# Patient Record
Sex: Female | Born: 1945
Health system: Southern US, Community
[De-identification: ages and names within clinical notes are randomized; demographics above are authoritative.]

## PROBLEM LIST (undated history)

## (undated) DIAGNOSIS — Z974 Presence of external hearing-aid: Secondary | ICD-10-CM

## (undated) DIAGNOSIS — E119 Type 2 diabetes mellitus without complications: Secondary | ICD-10-CM

## (undated) DIAGNOSIS — R05 Cough: Secondary | ICD-10-CM

## (undated) DIAGNOSIS — M1990 Unspecified osteoarthritis, unspecified site: Secondary | ICD-10-CM

## (undated) DIAGNOSIS — M940 Chondrocostal junction syndrome [Tietze]: Secondary | ICD-10-CM

## (undated) DIAGNOSIS — E7800 Pure hypercholesterolemia, unspecified: Secondary | ICD-10-CM

## (undated) DIAGNOSIS — J40 Bronchitis, not specified as acute or chronic: Secondary | ICD-10-CM

## (undated) DIAGNOSIS — K5792 Diverticulitis of intestine, part unspecified, without perforation or abscess without bleeding: Secondary | ICD-10-CM

## (undated) DIAGNOSIS — R053 Chronic cough: Secondary | ICD-10-CM

## (undated) DIAGNOSIS — K219 Gastro-esophageal reflux disease without esophagitis: Secondary | ICD-10-CM

## (undated) DIAGNOSIS — I10 Essential (primary) hypertension: Secondary | ICD-10-CM

## (undated) HISTORY — PX: MASTOIDECTOMY: SHX711

## (undated) HISTORY — PX: CATARACT EXTRACTION W/ INTRAOCULAR LENS IMPLANT: SHX1309

## (undated) HISTORY — PX: TONSILLECTOMY: SUR1361

## (undated) HISTORY — PX: THUMB AMPUTATION: SHX804

---

## 2004-08-16 ENCOUNTER — Emergency Department: Payer: Self-pay | Admitting: Emergency Medicine

## 2004-09-23 ENCOUNTER — Ambulatory Visit: Payer: Self-pay | Admitting: Nurse Practitioner

## 2005-04-10 HISTORY — PX: CHOLECYSTECTOMY: SHX55

## 2005-06-24 ENCOUNTER — Emergency Department: Payer: Self-pay | Admitting: Emergency Medicine

## 2005-09-06 ENCOUNTER — Other Ambulatory Visit: Payer: Self-pay

## 2005-09-06 ENCOUNTER — Ambulatory Visit: Payer: Self-pay | Admitting: General Surgery

## 2005-09-14 ENCOUNTER — Ambulatory Visit: Payer: Self-pay | Admitting: General Surgery

## 2006-05-14 DIAGNOSIS — H5440 Blindness, one eye, unspecified eye: Secondary | ICD-10-CM | POA: Insufficient documentation

## 2006-08-18 ENCOUNTER — Emergency Department: Payer: Self-pay | Admitting: Emergency Medicine

## 2006-08-18 ENCOUNTER — Other Ambulatory Visit: Payer: Self-pay

## 2006-09-05 ENCOUNTER — Ambulatory Visit: Payer: Self-pay | Admitting: Gastroenterology

## 2007-05-07 ENCOUNTER — Ambulatory Visit: Payer: Self-pay | Admitting: Internal Medicine

## 2007-05-07 ENCOUNTER — Other Ambulatory Visit: Payer: Self-pay

## 2007-05-11 ENCOUNTER — Ambulatory Visit: Payer: Self-pay | Admitting: Internal Medicine

## 2007-09-05 ENCOUNTER — Ambulatory Visit: Payer: Self-pay | Admitting: Internal Medicine

## 2008-01-15 ENCOUNTER — Ambulatory Visit: Payer: Self-pay | Admitting: Family Medicine

## 2008-01-15 ENCOUNTER — Other Ambulatory Visit: Payer: Self-pay

## 2008-01-15 ENCOUNTER — Inpatient Hospital Stay: Payer: Self-pay | Admitting: Internal Medicine

## 2008-03-07 ENCOUNTER — Ambulatory Visit: Payer: Self-pay | Admitting: Internal Medicine

## 2008-11-12 ENCOUNTER — Emergency Department: Payer: Self-pay

## 2009-08-19 ENCOUNTER — Ambulatory Visit: Payer: Self-pay | Admitting: Internal Medicine

## 2011-06-29 ENCOUNTER — Observation Stay: Payer: Self-pay | Admitting: Internal Medicine

## 2011-06-29 LAB — COMPREHENSIVE METABOLIC PANEL
Albumin: 3.9 g/dL (ref 3.4–5.0)
Alkaline Phosphatase: 91 U/L (ref 50–136)
Anion Gap: 16 (ref 7–16)
BUN: 10 mg/dL (ref 7–18)
Co2: 23 mmol/L (ref 21–32)
EGFR (African American): >60
EGFR (Non-African Amer.): >60
Glucose: 148 mg/dL — ABNORMAL HIGH (ref 65–99)
Potassium: 3.8 mmol/L (ref 3.5–5.1)
SGOT(AST): 39 U/L — ABNORMAL HIGH (ref 15–37)
SGPT (ALT): 66 U/L
Total Protein: 7.6 g/dL (ref 6.4–8.2)

## 2011-06-29 LAB — URINALYSIS, COMPLETE
Bacteria: NONE SEEN
Leukocyte Esterase: NEGATIVE
Nitrite: NEGATIVE
Ph: 5 (ref 4.5–8.0)
Protein: NEGATIVE
RBC,UR: NONE SEEN /HPF (ref 0–5)
Specific Gravity: 1.041 (ref 1.003–1.030)
Squamous Epithelial: <1

## 2011-06-29 LAB — CBC WITH DIFFERENTIAL/PLATELET
Basophil #: 0.1 10*3/uL (ref 0.0–0.1)
Basophil %: 0.7 %
HCT: 44.2 % (ref 35.0–47.0)
HGB: 15.1 g/dL (ref 12.0–16.0)
Lymphocyte #: 1.8 10*3/uL (ref 1.0–3.6)
Lymphocyte %: 17.5 %
MCH: 29.9 pg (ref 26.0–34.0)
MCHC: 34.2 g/dL (ref 32.0–36.0)
MCV: 87 fL (ref 80–100)
Monocyte %: 7.1 %
Neutrophil #: 7.5 10*3/uL — ABNORMAL HIGH (ref 1.4–6.5)
RDW: 13.9 % (ref 11.5–14.5)

## 2011-06-29 LAB — CK TOTAL AND CKMB (NOT AT ARMC): CK, Total: 158 U/L (ref 21–215)

## 2011-06-29 LAB — RAPID INFLUENZA A&B ANTIGENS

## 2011-06-29 LAB — TROPONIN I: Troponin-I: <0.02 ng/mL

## 2011-06-29 LAB — PROTIME-INR: Prothrombin Time: 13.2 secs (ref 11.5–14.7)

## 2011-06-30 DIAGNOSIS — I517 Cardiomegaly: Secondary | ICD-10-CM

## 2011-06-30 LAB — CBC WITH DIFFERENTIAL/PLATELET
Basophil #: 0 10*3/uL (ref 0.0–0.1)
Basophil %: 0.3 %
Eosinophil %: 0.2 %
HGB: 14.6 g/dL (ref 12.0–16.0)
Lymphocyte #: 0.9 10*3/uL — ABNORMAL LOW (ref 1.0–3.6)
Lymphocyte %: 12.3 %
MCH: 29.8 pg (ref 26.0–34.0)
MCHC: 33.9 g/dL (ref 32.0–36.0)
Monocyte #: 0.1 10*3/uL (ref 0.0–0.7)
Monocyte %: 1.3 %
Neutrophil #: 6.3 10*3/uL (ref 1.4–6.5)
Neutrophil %: 85.9 %
Platelet: 212 10*3/uL (ref 150–440)
RDW: 14.7 % — ABNORMAL HIGH (ref 11.5–14.5)

## 2011-06-30 LAB — BASIC METABOLIC PANEL
Anion Gap: 14 (ref 7–16)
Calcium, Total: 9.1 mg/dL (ref 8.5–10.1)
Chloride: 105 mmol/L (ref 98–107)
Co2: 25 mmol/L (ref 21–32)
Creatinine: 0.83 mg/dL (ref 0.60–1.30)
EGFR (African American): >60
Osmolality: 294 (ref 275–301)
Potassium: 4.4 mmol/L (ref 3.5–5.1)

## 2011-08-22 ENCOUNTER — Ambulatory Visit: Payer: Self-pay | Admitting: Internal Medicine

## 2012-08-22 ENCOUNTER — Ambulatory Visit: Payer: Self-pay | Admitting: Internal Medicine

## 2012-08-27 ENCOUNTER — Ambulatory Visit: Payer: Self-pay | Admitting: Internal Medicine

## 2012-09-25 ENCOUNTER — Emergency Department: Payer: Self-pay | Admitting: Emergency Medicine

## 2012-09-25 LAB — CBC WITH DIFFERENTIAL/PLATELET
Basophil %: 0.6 %
HGB: 15.5 g/dL (ref 12.0–16.0)
Lymphocyte %: 13.3 %
MCH: 29.9 pg (ref 26.0–34.0)
MCHC: 34 g/dL (ref 32.0–36.0)
MCV: 88 fL (ref 80–100)
Monocyte %: 5.5 %
Platelet: 233 10*3/uL (ref 150–440)
RBC: 5.2 10*6/uL (ref 3.80–5.20)
RDW: 14.1 % (ref 11.5–14.5)
WBC: 10.7 10*3/uL (ref 3.6–11.0)

## 2012-09-25 LAB — COMPREHENSIVE METABOLIC PANEL
Albumin: 3.7 g/dL (ref 3.4–5.0)
Alkaline Phosphatase: 96 U/L (ref 50–136)
Anion Gap: 7 (ref 7–16)
Calcium, Total: 9.6 mg/dL (ref 8.5–10.1)
Chloride: 103 mmol/L (ref 98–107)
Co2: 26 mmol/L (ref 21–32)
EGFR (African American): >60
EGFR (Non-African Amer.): >60
Glucose: 204 mg/dL — ABNORMAL HIGH (ref 65–99)
Osmolality: 279 (ref 275–301)
Potassium: 4.4 mmol/L (ref 3.5–5.1)
SGOT(AST): 112 U/L — ABNORMAL HIGH (ref 15–37)

## 2012-09-25 LAB — TROPONIN I: Troponin-I: <0.02 ng/mL

## 2013-02-16 ENCOUNTER — Ambulatory Visit: Payer: Self-pay | Admitting: Emergency Medicine

## 2013-02-16 LAB — URINALYSIS, COMPLETE
Bacteria: NEGATIVE
Bilirubin,UR: NEGATIVE
Ph: 6 (ref 4.5–8.0)
Protein: NEGATIVE
Specific Gravity: 1.02 (ref 1.003–1.030)

## 2013-04-22 ENCOUNTER — Ambulatory Visit: Payer: Self-pay | Admitting: Surgery

## 2013-08-19 DIAGNOSIS — H9193 Unspecified hearing loss, bilateral: Secondary | ICD-10-CM | POA: Insufficient documentation

## 2013-08-20 DIAGNOSIS — R252 Cramp and spasm: Secondary | ICD-10-CM | POA: Insufficient documentation

## 2013-09-06 ENCOUNTER — Inpatient Hospital Stay: Payer: Self-pay | Admitting: Internal Medicine

## 2013-09-06 LAB — CBC
HCT: 45.6 % (ref 35.0–47.0)
HGB: 14.9 g/dL (ref 12.0–16.0)
MCH: 29 pg (ref 26.0–34.0)
MCHC: 32.7 g/dL (ref 32.0–36.0)
MCV: 89 fL (ref 80–100)
PLATELETS: 226 10*3/uL (ref 150–440)
RBC: 5.14 10*6/uL (ref 3.80–5.20)
RDW: 14.4 % (ref 11.5–14.5)
WBC: 16.5 10*3/uL — ABNORMAL HIGH (ref 3.6–11.0)

## 2013-09-06 LAB — URINALYSIS, COMPLETE
BACTERIA: NONE SEEN
Bilirubin,UR: NEGATIVE
Blood: NEGATIVE
Glucose,UR: >=500 mg/dL (ref 0–75)
Hyaline Cast: 2
Ketone: NEGATIVE
LEUKOCYTE ESTERASE: NEGATIVE
NITRITE: NEGATIVE
PROTEIN: NEGATIVE
Ph: 5 (ref 4.5–8.0)
RBC,UR: NONE SEEN /HPF (ref 0–5)
SPECIFIC GRAVITY: 1.008 (ref 1.003–1.030)
Squamous Epithelial: NONE SEEN

## 2013-09-06 LAB — COMPREHENSIVE METABOLIC PANEL
ALBUMIN: 3.7 g/dL (ref 3.4–5.0)
AST: 41 U/L — AB (ref 15–37)
Alkaline Phosphatase: 107 U/L
Anion Gap: 8 (ref 7–16)
BUN: 13 mg/dL (ref 7–18)
Bilirubin,Total: 0.4 mg/dL (ref 0.2–1.0)
CALCIUM: 9.3 mg/dL (ref 8.5–10.1)
CREATININE: 0.91 mg/dL (ref 0.60–1.30)
Chloride: 105 mmol/L (ref 98–107)
Co2: 24 mmol/L (ref 21–32)
EGFR (African American): >60
EGFR (Non-African Amer.): >60
GLUCOSE: 175 mg/dL — AB (ref 65–99)
Osmolality: 278 (ref 275–301)
POTASSIUM: 3.8 mmol/L (ref 3.5–5.1)
SGPT (ALT): 52 U/L (ref 12–78)
SODIUM: 137 mmol/L (ref 136–145)
TOTAL PROTEIN: 7.5 g/dL (ref 6.4–8.2)

## 2013-09-06 LAB — RAPID INFLUENZA A&B ANTIGENS

## 2013-09-06 LAB — TROPONIN I: Troponin-I: <0.02 ng/mL

## 2013-09-07 LAB — CBC WITH DIFFERENTIAL/PLATELET
Basophil #: 0.1 10*3/uL (ref 0.0–0.1)
Basophil %: 0.3 %
Eosinophil #: 0 10*3/uL (ref 0.0–0.7)
Eosinophil %: 0.1 %
HCT: 42.7 % (ref 35.0–47.0)
HGB: 13.9 g/dL (ref 12.0–16.0)
Lymphocyte #: 1.5 10*3/uL (ref 1.0–3.6)
Lymphocyte %: 9.2 %
MCH: 28.9 pg (ref 26.0–34.0)
MCHC: 32.5 g/dL (ref 32.0–36.0)
MCV: 89 fL (ref 80–100)
Monocyte #: 0.5 x10 3/mm (ref 0.2–0.9)
Monocyte %: 2.9 %
Neutrophil #: 13.9 10*3/uL — ABNORMAL HIGH (ref 1.4–6.5)
Neutrophil %: 87.5 %
Platelet: 220 10*3/uL (ref 150–440)
RBC: 4.8 10*6/uL (ref 3.80–5.20)
RDW: 14.3 % (ref 11.5–14.5)
WBC: 15.9 10*3/uL — ABNORMAL HIGH (ref 3.6–11.0)

## 2013-09-07 LAB — BASIC METABOLIC PANEL
ANION GAP: 3 — AB (ref 7–16)
BUN: 10 mg/dL (ref 7–18)
CALCIUM: 8.9 mg/dL (ref 8.5–10.1)
CO2: 27 mmol/L (ref 21–32)
Chloride: 107 mmol/L (ref 98–107)
Creatinine: 0.89 mg/dL (ref 0.60–1.30)
EGFR (African American): >60
GLUCOSE: 185 mg/dL — AB (ref 65–99)
OSMOLALITY: 278 (ref 275–301)
Potassium: 3.8 mmol/L (ref 3.5–5.1)
SODIUM: 137 mmol/L (ref 136–145)

## 2013-09-07 LAB — MAGNESIUM: MAGNESIUM: 1.6 mg/dL — AB

## 2013-09-11 LAB — CULTURE, BLOOD (SINGLE)

## 2013-11-14 ENCOUNTER — Ambulatory Visit: Payer: Self-pay | Admitting: Family Medicine

## 2014-07-02 DIAGNOSIS — R920 Mammographic microcalcification found on diagnostic imaging of breast: Secondary | ICD-10-CM | POA: Insufficient documentation

## 2014-08-01 NOTE — H&P (Signed)
PATIENT NAME:  Alison Warren, Alison Warren MR#:  [id number] DATE OF BIRTH:  1945-04-27  DATE OF ADMISSION:  09/06/2013  REFERRING PHYSICIAN: Wells Guiles L. Reita Cliche, MD  PRIMARY CARE PHYSICIAN: Christena Flake. Thies, MD  CHIEF COMPLAINT: Cough, fever, shortness of breath.   HISTORY OF PRESENT ILLNESS: This is a 69 year old female with known history of hypertension, diabetes, with a questionable history of COPD in the past. The patient presents with complaining of shortness of breath for the last day. As well, reports having fever at 102 at home, was febrile by the EMS at 101.1. Her temperature was 99.5 after she received Tylenol. As well, complains of productive yellow sputum over the last 20 hours. Reports flulike symptoms as well. The patient had flu negative. The patient had leukocytosis at 16,000. Her chest x-ray did not show any opacity or any infiltrate. The patient reports feeling generally weak and has loss of appetite. As well, reports she is having runny nose, nasal drainage and sore throat. The patient has significant sore throat on physical exam as well. She was started on erythromycin in ED.   PAST MEDICAL HISTORY:  1. Hypertension. 2. Hyperlipidemia.  3. Diabetes.  4. Bronchitis.  5. Diverticulitis.   PAST SURGICAL HISTORY: 1. Ear surgery for mastoiditis at the age of 60.  2. Cholecystectomy.   HOME MEDICATIONS:  1. Norvasc 5 mg daily. 2. Coreg 3.125 mg oral daily.  3. Metformin extended release 1000 mg oral daily. 4. Nexium 40 mg daily.  5. Claritin 10 mg daily.   ALLERGIES:  1. IBUPROFEN.  2. TRAMADOL.   FAMILY HISTORY: Significant for coronary artery disease.   SOCIAL HISTORY: The patient is a widow. Lives in [place]. No smoking. No alcohol. No illicit drug use.   REVIEW OF SYSTEMS:  CONSTITUTIONAL: Reports fever, chills, fatigue, weakness.  EYES: Denies blurry vision, double vision or inflammation.  ENT: Denies tinnitus, ear pain, hearing loss, epistaxis. Reports runny nose and sore  throat.  RESPIRATORY: Reports cough, productive sputum, shortness of breath. Denies any COPD or  hemoptysis.  CARDIOVASCULAR: Denies chest pain, edema, palpitation, syncope.  GASTROINTESTINAL: Denies nausea, vomiting, diarrhea, abdominal pain, hematemesis.  GENITOURINARY: Denies dysuria, hematuria, renal colic.  ENDOCRINE: Denies polyuria, polydipsia, heat or cold intolerance.  HEMATOLOGY: Denies anemia, easy bruising or bleeding diathesis. INTEGUMENTARY: Denies acne, rash or skin lesion.  MUSCULOSKELETAL: Denies any swelling, gout, arthritis.  NEUROLOGIC: Denies CVA, TIA, focal deficits, tremors, vertigo. PSYCHIATRIC: Denies anxiety, insomnia or depression.   PHYSICAL EXAMINATION:  VITAL SIGNS: Temperature 99.5, pulse 97, respiratory rate 40, blood pressure 135/74, saturating 94% on room air.  GENERAL: A frail elderly female, looks comfortable in bed, in no apparent distress.  HEENT: Head atraumatic, normocephalic. Pupils equally reactive to light. Pink conjunctivae. Anicteric sclerae. Dry oral mucosa. Has significant pharyngeal erythema at the back of her throat, but no tonsillar swelling or airway compromise noticed.  CHEST: Had good air entry bilaterally. No wheezing, rales or rhonchi.  CARDIOVASCULAR: S1, S2 heard. No rubs, murmurs or gallops.  ABDOMEN: Soft, nontender, nondistended. Bowel sounds present.  EXTREMITIES: No edema. No clubbing. No cyanosis.  PSYCHIATRIC: Appropriate affect. Awake, alert x3. Intact judgment and insight.  NEUROLOGIC: Cranial nerves grossly intact. Motor 5 out of 5. Pedal and radial pulses felt bilaterally.  PSYCHIATRIC: Appropriate affect. Awake, alert x3. Intact judgment and insight.  SKIN: Normal skin turgor. Warm and dry.   PERTINENT LABORATORY DATA: Glucose 175, BUN 13, creatinine 0.91, sodium 137, potassium 3.8, chloride 105, CO2 24. Troponin less than 0.02.  ALT 52, AST 41, alkaline phosphatase 107. White blood cells 16.5, hemoglobin 14.9, hematocrit  45.6, platelets 226. The patient is flu negative.   CHEST X-RAY: No acute finding.   ASSESSMENT AND PLAN:  1. Sepsis. The patient presents with fever and leukocytosis, source most likely related to upper respiratory infection, acute bronchitis versus acute pharyngitis. Will check blood cultures. Will check urinalysis. The patient will be started on IV antibiotics and IV hydration.  2. Acute pharyngitis. Will check strep throat. Will continue the patient on erythromycin. As well, possibly she is having acute bronchitis as well, so will continue with Rocephin and erythromycin. Will repeat a chest x-ray after appropriate hydration to rule out pneumonia as well.  3. Hypertension. Blood pressure acceptable. Continue with home meds.  4. Diabetes mellitus. Will continue the patient on her metformin. Will add insulin sliding scale.  5. Deep vein thrombosis prophylaxis. Subcutaneous heparin.  6. Gastrointestinal prophylaxis. Protonix.   CODE STATUS: Full code.   TOTAL TIME SPENT ON ADMISSION AND PATIENT CARE: 45 minutes.   ____________________________ Albertine Patricia, MD dse:lb D: 09/06/2013 06:40:22 ET T: 09/06/2013 06:53:40 ET JOB#: [id number]  cc: Albertine Patricia, MD, <Dictator> Zafir Schauer Graciela Husbands MD ELECTRONICALLY SIGNED 09/07/2013 20:25

## 2014-08-01 NOTE — Discharge Summary (Signed)
PATIENT NAME:  Alison Warren, Alison Warren MR#:  [id number] DATE OF BIRTH:  09-16-1945  DATE OF ADMISSION:  09/06/2013 DATE OF DISCHARGE:  09/09/2013  ADMITTING PHYSICIAN: Albertine Patricia, MD  DISCHARGING PHYSICIAN: Gladstone Lighter, MD  PRIMARY CARE PHYSICIAN: Christena Flake. Raechel Ache, [place]: None.   DISCHARGE DIAGNOSES:  1. Early sepsis versus systemic inflammatory response syndrome on admission, resolved now.  2. Acute pharyngitis and bronchitis.  3. Hypertension.  4. Noninsulin-dependent diabetes mellitus.   DISCHARGE HOME MEDICATIONS:  1. Claritin 10 mg p.o. daily.  2. Metformin 1000 mg p.o. daily.  3. Carvedilol 3.125 mg p.o. b.i.d.  4. Nexium 40 mg p.o. daily.  5. Norvasc 5 mg p.o. daily.  6. Tussionex 5 mL q.12 hours p.r.n. for cough.  7. Tessalon Perles 100 mg every 6 hours as needed for cough.  8. Levaquin 500 mg p.o. daily for 4 days.  9. Prednisone taper.  10. Albuterol inhaler as needed for shortness of breath.   DISCHARGE DIET: Low-sodium diet.   DISCHARGE ACTIVITY: As tolerated.    FOLLOWUP INSTRUCTIONS: PCP followup in 1 week.   LABORATORY AND IMAGING STUDIES PRIOR TO DISCHARGE:  WBC 15.9, hemoglobin 13.9, hematocrit 42.7, platelet count is 220.  Sodium 137, potassium 3.8, chloride 107, bicarbonate 27, BUN 10, creatinine 0.89, glucose 185 and calcium of 8.9, magnesium 1.6.  Urinalysis negative for any infection.  Chest x-ray showing clear lung fields. No acute cardiopulmonary disease.  Blood cultures negative.  Influenza antigen tests are negative.   BRIEF HOSPITAL COURSE: Ms. Callaway is a 70 year old female with history of hypertension and diabetes, who presented to the hospital with fever, difficulty breathing and was also noted to have elevated white count of 16,000.    1. Early sepsis secondary to acute bronchitis and pharyngitis. Chest x-ray was clear. Blood cultures were negative. She was started on IV antibiotics and also steroids and  inhalers, and that made her feel a lot better. She is also being discharged on some cough medicines along with p.o. prednisone taper and p.o. antibiotics.  2. Hypertension. The patient's blood pressure has remained stable. She is only on low-dose Coreg and Norvasc as an outpatient, which are being continued in the hospital.  3. Diabetes. The patient on metformin, which was continued.  4. Her course has been otherwise uneventful in the hospital.   DISCHARGE CONDITION: Stable.   DISCHARGE DISPOSITION: Home.   TIME SPENT ON DISCHARGE: 40 minutes.   ____________________________ Gladstone Lighter, MD rk:lb D: 09/09/2013 14:16:42 ET T: 09/09/2013 14:39:37 ET JOB#: [id number]  cc: Gladstone Lighter, MD, <Dictator> Christena Flake. Raechel Ache, MD Gladstone Lighter MD ELECTRONICALLY SIGNED 09/20/2013 14:17

## 2014-08-02 NOTE — Discharge Summary (Signed)
PATIENT NAME:  Alison Warren, Alison Warren MR#:  [id number] DATE OF BIRTH:  Nov 24, 1945  DATE OF ADMISSION:  06/29/2011 DATE OF DISCHARGE:  07/01/2011  PRESENTING COMPLAINT: Shortness of breath.   DISCHARGE DIAGNOSES:  1. Acute bronchitis.  2. Hypertension.  3. Type 2 diabetes.   CONDITION ON DISCHARGE: Fair. Sats are more than 92% on room air.   MEDICATIONS:  1. Nexium 40 mg p.o. daily.  2. Carvedilol 3.125 p.o. daily.  3. Metformin 500 mg 2 tablets daily.  4. Aspirin 81 mg daily.  5. Amlodipine 5 mg daily.  6. Augmentin 875 mg p.o. b.i.d. for five days.  7. Prednisone 50 mg p.o. daily with taper.  8. Tessalon Perles 100 mg t.i.d. p.r.n.   FOLLOWUP: Follow up with Dr. Raechel Ache in 1 to 2 weeks.   LABS AT DISCHARGE: Echo Doppler showed ejection fraction of 55%. Transmitral spectral flow pattern is suggestive of impaired LV relaxation. The left ventricular wall motion is normal. Left ventricle is normal in size. There is mild concentric left ventricular hypertrophy. Left ventricular systolic function is normal. Right ventricle is normal size. Right ventricular systolic function is normal. Left atrium is mildly dilated. Right atrial size is normal. There is no Doppler evidence of atrioseptal defect. Chest x-ray shows shallow inspiration without any evidence of acute cardiopulmonary disease. CBC within normal limits. Comprehensive metabolic panel within normal limits with glucose of 237. Blood cultures negative in two days. Urinalysis negative for urinary tract infection. CT chest showed no evidence of thoracic aortic aneurysm. No pulmonary embolic filling defect evident. Probably mild fatty infiltration of the liver. Influenza A and B negative. PT-INR within normal limits.   BRIEF SUMMARY OF HOSPITAL COURSE: Alison Warren is a 69 year old Caucasian female with past medical history of gastroesophageal reflux disease and hyperlipidemia who comes to the Emergency Room with:  1. Chronic obstructive pulmonary  disease exacerbation with bronchitis and desaturation in the 60s on minimal ambulation. Clinically improved with p.o. Augmentin and IV steroids and cough drops. Sats were more than 92% on room air. Chest x-ray remained negative. Blood cultures remained negative.  2. Hypertension. The patient was continued on home medications included Coreg and amlodipine.  3. Type 2 diabetes. Metformin was continued.  4. Gastroesophageal reflux disease. The patient was continued on Nexium. 5. Hospital stay otherwise remained stable. The patient remained a FULL CODE.  TIME SPENT: 40 minutes.   ____________________________ Hart Rochester Posey Pronto, MD sap:bjt D: 07/17/2011 14:43:14 ET T: 07/18/2011 11:43:03 ET JOB#: [id number]  cc: Sherrick Araki A. Posey Pronto, MD, <Dictator> Christena Flake. Raechel Ache, MD Ilda Basset MD ELECTRONICALLY SIGNED 07/20/2011 14:29

## 2014-08-02 NOTE — H&P (Signed)
PATIENT NAME:  Alison Warren, Alison Warren MR#:  [id number] DATE OF BIRTH:  1945-04-22  DATE OF ADMISSION:  06/29/2011  PRIMARY CARE PHYSICIAN: Dr. Ezequiel Kayser  REQUESTING PHYSICIAN: Dr. Ulice Brilliant   CHIEF COMPLAINT: Shortness of breath.   HISTORY OF PRESENT ILLNESS: Patient is a 68 year old female with a known history of hypertension, diabetes is being admitted for suspected chronic obstructive pulmonary disease exacerbation. Patient started having trouble breathing for last two days associated with hacking nonproductive cough. Denied any fever. Her ribs are hurting as her cough is continuous and her oxygen saturation dropped to 67% with minimal ambulation. She is being admitted for further evaluation and management.    PAST MEDICAL HISTORY:  1. Hypertension.  2. Hyperlipidemia.  3. Diabetes.  4. Bronchitis.  5. Diverticulitis.   PAST SURGICAL HISTORY:  1. Ear surgery for mastoiditis at the age of 15. 2. Cholecystectomy.   MEDICATIONS AT HOME:  1. Amlodipine 5 mg p.o. daily.  2. Aspirin 81 mg p.o. daily. 3. Coreg 3.125 mg p.o. daily.  4. Metformin 1000 mg p.o. daily.  5. Nexium 40 mg p.o. daily.   ALLERGIES: Ibuprofen.   FAMILY HISTORY: Mother with coronary disease. Father died of coronary disease.   SOCIAL HISTORY: She is widowed. She is a retired Engineer, manufacturing systems. Originally from [place], lives in [place] for last 40 years. 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   REVIEW OF SYSTEMS: CONSTITUTIONAL: No fever. Positive fatigue and weakness. EYES: No blurred or double vision. ENT: No tinnitus or ear pain. RESPIRATORY: Positive for nonproductive dry hacking cough. No wheezing. No hemoptysis. Positive for dyspnea on exertion. CARDIOVASCULAR: No chest pain. Positive for rib pain from continuous coughing. No orthopnea or edema. Positive for dyspnea on exertion. GASTROINTESTINAL: No nausea, vomiting, diarrhea. GENITOURINARY: No dysuria, hematuria. ENDOCRINE: No polyuria or nocturia. HEMATOLOGY:  No anemia, easy bruising. SKIN: No rash or lesion. MUSCULOSKELETAL: No arthritis or muscle cramps. NEUROLOGIC: No tingling, numbness, weakness. PSYCHIATRIC: No history of anxiety or depression.   PHYSICAL EXAMINATION:  VITAL SIGNS: Temperature 98.2, heart rate 84 per minute, respirations 18 per minute, blood pressure 129/73 mmHg. She was saturating 92% on room air but on minimal ambulation her oxygen saturation dropped to 67%.    GENERAL: Patient is a 69 year old female lying in the bed in minimal respiratory distress.   EYES: Pupils equal, round, reactive to light, accommodation. No scleral icterus. Extraocular muscles intact.   HENT: Head atraumatic, normocephalic. Oropharynx and nasopharynx clear.   NECK: Supple. No jugular venous distention. No thyroid enlargement of tenderness.   LUNGS: Decreased breath sounds at the bases bilaterally. Minimal expiratory wheezing throughout both lungs. No rales. Minimal rhonchi at the left lung base.   CARDIOVASCULAR: S1, S2 normal. No murmur, rales, gallops.   ABDOMEN: Soft, nontender, nondistended. Bowel sounds present. No organomegaly or mass.   EXTREMITIES: No pedal edema, cyanosis, clubbing.   NEUROLOGIC: Nonfocal examination. Cranial nerves III through XII intact. Muscle strength 5/5. Extremity sensation intact.   PSYCHIATRIC: Patient is oriented to time, place, and person x3.   SKIN: No obvious rash, lesion, ulcer.  LABORATORY, DIAGNOSTIC AND RADIOLOGICAL DATA: Normal BMP. Normal liver function tests except AST of 39. Normal first set of cardiac enzymes. Normal CBC. Normal coagulation panel. Negative influenza test. Negative urinalysis.   Chest x-ray in the ED was negative for any acute cardiopulmonary disease.   CT scan of the chest while in the Emergency Department showed no thoracic aortic aneurysm or dissection. No PE. Dependent atelectasis in  the lower lobes. Mild fatty infiltration of the liver.   EKG showed no acute ST-T changes,  normal sinus rhythm.   IMPRESSION AND PLAN:  1. Possible chronic obstructive pulmonary disease exacerbation with significant desaturations in 60s on minimal ambulation. She will likely need oxygen on discharge. Will start on IV Solu-Medrol and provide her breathing treatment along with Levaquin for possible bronchitis. Obtain sputum culture.  2. Hypertension. Will continue home medication including Coreg, amlodipine. Blood pressure is stable.  3. Diabetes. Will continue metformin.  4. Gastroesophageal reflux disease. Will continue Nexium.  5. Hypoxia likely due to chronic obstructive pulmonary disease flare. Will continue steroids and antibiotics along with breathing treatment and monitor her as observation. Will also obtain 2-D echocardiogram to evaluate for any underlying heart issue as her CT chest and chest x-ray has not been showing any obvious pulmonary disease. She does not have any smoking history either.  6. Will obtain physical therapy consult for any skilled therapy needs.   TOTAL TIME TAKING CARE OF THIS PATIENT: 55 minutes.        CODE STATUS: FULL CODE.   ____________________________ Karrie Fluellen S. Manuella Ghazi, MD vss:cms D: 06/29/2011 13:17:25 ET T: 06/29/2011 13:36:13 ET JOB#: [id number]  cc: Imagine Nest S. Manuella Ghazi, MD, <Dictator> Christena Flake. Raechel Ache, MD Lucina Mellow [facility] MD ELECTRONICALLY SIGNED 06/30/2011 17:00

## 2014-08-19 ENCOUNTER — Other Ambulatory Visit: Payer: Self-pay | Admitting: Internal Medicine

## 2014-08-19 DIAGNOSIS — R928 Other abnormal and inconclusive findings on diagnostic imaging of breast: Secondary | ICD-10-CM

## 2014-08-19 HISTORY — DX: Hypomagnesemia: E83.42

## 2014-08-26 ENCOUNTER — Ambulatory Visit: Payer: Self-pay

## 2014-08-26 ENCOUNTER — Other Ambulatory Visit: Payer: Self-pay | Admitting: Internal Medicine

## 2014-08-26 ENCOUNTER — Ambulatory Visit: Payer: Medicare Other

## 2014-08-26 ENCOUNTER — Ambulatory Visit
Admission: RE | Admit: 2014-08-26 | Discharge: 2014-08-26 | Disposition: A | Discharge status: Home / Self Care | Payer: Medicare Other | Source: Ambulatory Visit | Attending: Internal Medicine | Admitting: Internal Medicine

## 2014-08-26 DIAGNOSIS — R928 Other abnormal and inconclusive findings on diagnostic imaging of breast: Secondary | ICD-10-CM | POA: Diagnosis present

## 2014-08-26 DIAGNOSIS — R921 Mammographic calcification found on diagnostic imaging of breast: Secondary | ICD-10-CM | POA: Diagnosis not present

## 2014-12-03 ENCOUNTER — Encounter: Payer: Self-pay | Admitting: *Deleted

## 2014-12-04 NOTE — Discharge Instructions (Signed)

## 2014-12-07 ENCOUNTER — Ambulatory Visit: Payer: Medicare Other | Admitting: Anesthesiology

## 2014-12-07 ENCOUNTER — Encounter
Admission: RE | Disposition: A | Discharge status: Home / Self Care | Payer: Self-pay | Source: Ambulatory Visit | Attending: Ophthalmology

## 2014-12-07 ENCOUNTER — Ambulatory Visit
Admission: RE | Admit: 2014-12-07 | Discharge: 2014-12-07 | Disposition: A | Discharge status: Home / Self Care | Payer: Medicare Other | Source: Ambulatory Visit | Attending: Ophthalmology | Admitting: Ophthalmology

## 2014-12-07 DIAGNOSIS — I10 Essential (primary) hypertension: Secondary | ICD-10-CM | POA: Insufficient documentation

## 2014-12-07 DIAGNOSIS — E780 Pure hypercholesterolemia: Secondary | ICD-10-CM | POA: Insufficient documentation

## 2014-12-07 DIAGNOSIS — K219 Gastro-esophageal reflux disease without esophagitis: Secondary | ICD-10-CM | POA: Diagnosis not present

## 2014-12-07 DIAGNOSIS — Z7982 Long term (current) use of aspirin: Secondary | ICD-10-CM | POA: Diagnosis not present

## 2014-12-07 DIAGNOSIS — M1990 Unspecified osteoarthritis, unspecified site: Secondary | ICD-10-CM | POA: Diagnosis not present

## 2014-12-07 DIAGNOSIS — H2511 Age-related nuclear cataract, right eye: Secondary | ICD-10-CM | POA: Insufficient documentation

## 2014-12-07 DIAGNOSIS — E119 Type 2 diabetes mellitus without complications: Secondary | ICD-10-CM | POA: Diagnosis not present

## 2014-12-07 DIAGNOSIS — J45909 Unspecified asthma, uncomplicated: Secondary | ICD-10-CM | POA: Insufficient documentation

## 2014-12-07 DIAGNOSIS — Z886 Allergy status to analgesic agent status: Secondary | ICD-10-CM | POA: Insufficient documentation

## 2014-12-07 DIAGNOSIS — Z885 Allergy status to narcotic agent status: Secondary | ICD-10-CM | POA: Insufficient documentation

## 2014-12-07 DIAGNOSIS — H9190 Unspecified hearing loss, unspecified ear: Secondary | ICD-10-CM | POA: Diagnosis not present

## 2014-12-07 HISTORY — DX: Presence of external hearing-aid: Z97.4

## 2014-12-07 HISTORY — DX: Unspecified osteoarthritis, unspecified site: M19.90

## 2014-12-07 HISTORY — PX: CATARACT EXTRACTION W/PHACO: SHX586

## 2014-12-07 HISTORY — DX: Type 2 diabetes mellitus without complications: E11.9

## 2014-12-07 HISTORY — DX: Chronic cough: R05.3

## 2014-12-07 HISTORY — DX: Pure hypercholesterolemia, unspecified: E78.00

## 2014-12-07 HISTORY — DX: Chondrocostal junction syndrome (tietze): M94.0

## 2014-12-07 HISTORY — DX: Gastro-esophageal reflux disease without esophagitis: K21.9

## 2014-12-07 HISTORY — DX: Bronchitis, not specified as acute or chronic: J40

## 2014-12-07 HISTORY — DX: Essential (primary) hypertension: I10

## 2014-12-07 HISTORY — DX: Cough: R05

## 2014-12-07 LAB — GLUCOSE, CAPILLARY
Glucose-Capillary: 144 mg/dL — ABNORMAL HIGH (ref 65–99)
Glucose-Capillary: 157 mg/dL — ABNORMAL HIGH (ref 65–99)

## 2014-12-07 SURGERY — PHACOEMULSIFICATION, CATARACT, WITH IOL INSERTION
Anesthesia: Monitor Anesthesia Care | Laterality: Right | Wound class: Clean

## 2014-12-07 MED ORDER — ACETAMINOPHEN 160 MG/5ML PO SOLN: 325.0000 mg | ORAL | Status: DC | PRN

## 2014-12-07 MED ORDER — TETRACAINE HCL 0.5 % OP SOLN
1.0000 [drp] | OPHTHALMIC | Status: DC | PRN
  Administered 2014-12-07: 1 [drp] via OPHTHALMIC

## 2014-12-07 MED ORDER — EPINEPHRINE HCL 1 MG/ML IJ SOLN
INTRAMUSCULAR | Status: DC | PRN
  Administered 2014-12-07: 65 mL via OPHTHALMIC

## 2014-12-07 MED ORDER — POVIDONE-IODINE 5 % OP SOLN
1.0000 "application " | OPHTHALMIC | Status: DC | PRN
  Administered 2014-12-07: 1 via OPHTHALMIC

## 2014-12-07 MED ORDER — TIMOLOL MALEATE 0.5 % OP SOLN
OPHTHALMIC | Status: DC | PRN
  Administered 2014-12-07: 1 [drp] via OPHTHALMIC

## 2014-12-07 MED ORDER — MOXIFLOXACIN HCL 0.5 % OP SOLN
OPHTHALMIC | Status: DC | PRN
  Administered 2014-12-07: .1 [drp] via OPHTHALMIC

## 2014-12-07 MED ORDER — ACETAMINOPHEN 325 MG PO TABS: 325.0000 mg | ORAL_TABLET | ORAL | Status: DC | PRN

## 2014-12-07 MED ORDER — LIDOCAINE HCL (PF) 4 % IJ SOLN
INTRAOCULAR | Status: DC | PRN
  Administered 2014-12-07: 1 mL via OPHTHALMIC

## 2014-12-07 MED ORDER — ARMC OPHTHALMIC DILATING GEL
1.0000 "application " | OPHTHALMIC | Status: DC | PRN
  Administered 2014-12-07 (×2): 1 via OPHTHALMIC

## 2014-12-07 MED ORDER — LACTATED RINGERS IV SOLN: INTRAVENOUS | Status: DC

## 2014-12-07 MED ORDER — BRIMONIDINE TARTRATE 0.2 % OP SOLN
OPHTHALMIC | Status: DC | PRN
  Administered 2014-12-07: 1 [drp] via OPHTHALMIC

## 2014-12-07 SURGICAL SUPPLY — 29 items
APPLICATOR COTTON TIP 3IN (MISCELLANEOUS) ×3 IMPLANT
CANNULA ANT/CHMB 27GA (MISCELLANEOUS) ×3 IMPLANT
DISSECTOR HYDRO NUCLEUS 50X22 (MISCELLANEOUS) ×3 IMPLANT
GLOVE BIO SURGEON STRL SZ7 (GLOVE) ×3 IMPLANT
GLOVE SURG LX 6.5 MICRO (GLOVE) ×2
GLOVE SURG LX STRL 6.5 MICRO (GLOVE) ×1 IMPLANT
GOWN STRL REUS W/ TWL LRG LVL3 (GOWN DISPOSABLE) ×2 IMPLANT
GOWN STRL REUS W/TWL LRG LVL3 (GOWN DISPOSABLE) ×4
LENS IOL ACRSF IQ PC 24.5 (Intraocular Lens) ×1 IMPLANT
LENS IOL ACRYSOF IQ POST 24.5 (Intraocular Lens) ×3 IMPLANT
MARKER SKIN SURG W/RULER VIO (MISCELLANEOUS) ×3 IMPLANT
NEEDLE FILTER BLUNT 18X 1/2SAF (NEEDLE) ×2
NEEDLE FILTER BLUNT 18X1 1/2 (NEEDLE) ×1 IMPLANT
PACK CATARACT BRASINGTON (MISCELLANEOUS) ×3 IMPLANT
PACK EYE AFTER SURG (MISCELLANEOUS) ×3 IMPLANT
PACK OPTHALMIC (MISCELLANEOUS) ×3 IMPLANT
RING MALYGIN 7.0 (MISCELLANEOUS) IMPLANT
SOL BAL SALT 15ML (MISCELLANEOUS)
SOLUTION BAL SALT 15ML (MISCELLANEOUS) IMPLANT
SUT ETHILON 10-0 CS-B-6CS-B-6 (SUTURE)
SUT VICRYL  9 0 (SUTURE)
SUT VICRYL 9 0 (SUTURE) IMPLANT
SUTURE EHLN 10-0 CS-B-6CS-B-6 (SUTURE) IMPLANT
SYR 3ML LL SCALE MARK (SYRINGE) ×3 IMPLANT
SYR TB 1ML LUER SLIP (SYRINGE) ×3 IMPLANT
WATER STERILE IRR 250ML POUR (IV SOLUTION) ×3 IMPLANT
WATER STERILE IRR 500ML POUR (IV SOLUTION) IMPLANT
WICK EYE OCUCEL (MISCELLANEOUS) IMPLANT
WIPE NON LINTING 3.25X3.25 (MISCELLANEOUS) ×3 IMPLANT

## 2014-12-07 NOTE — Transfer of Care (Signed)
Immediate Anesthesia Transfer of Care Note  Patient: Alison Warren  Procedure(s) Performed: Procedure(s) with comments: CATARACT EXTRACTION PHACO AND INTRAOCULAR LENS PLACEMENT (IOC) (Right) - DIABETIC - oral meds PER DR VIN START TIME 8AM  Patient Location: PACU  Anesthesia Type: MAC  Level of Consciousness: awake, alert  and patient cooperative  Airway and Oxygen Therapy: Patient Spontanous Breathing and Patient connected to supplemental oxygen  Post-op Assessment: Post-op Vital signs reviewed, Patient's Cardiovascular Status Stable, Respiratory Function Stable, Patent Airway and No signs of Nausea or vomiting  Post-op Vital Signs: Reviewed and stable  Complications: No apparent anesthesia complications

## 2014-12-07 NOTE — Anesthesia Preprocedure Evaluation (Signed)
Anesthesia Evaluation  Patient identified by MRN, date of birth, ID band  Reviewed: Allergy & Precautions, H&P , NPO status , Patient's Chart, lab work & pertinent test results  Airway Mallampati: II  TM Distance: >3 FB Neck ROM: full    Dental no notable dental hx.    Pulmonary    Pulmonary exam normal       Cardiovascular hypertension, Rhythm:regular Rate:Normal     Neuro/Psych    GI/Hepatic GERD-  ,  Endo/Other  diabetes  Renal/GU      Musculoskeletal   Abdominal   Peds  Hematology   Anesthesia Other Findings   Reproductive/Obstetrics                             Anesthesia Physical Anesthesia Plan  ASA: II  Anesthesia Plan: MAC   Post-op Pain Management:    Induction:   Airway Management Planned:   Additional Equipment:   Intra-op Plan:   Post-operative Plan:   Informed Consent: I have reviewed the patients History and Physical, chart, labs and discussed the procedure including the risks, benefits and alternatives for the proposed anesthesia with the patient or authorized representative who has indicated his/her understanding and acceptance.     Plan Discussed with: CRNA  Anesthesia Plan Comments:         Anesthesia Quick Evaluation

## 2014-12-07 NOTE — Op Note (Signed)
Date of Surgery: 12/07/2014  PREOPERATIVE DIAGNOSES: Visually significant nuclear sclerotic cataract, right eye.  POSTOPERATIVE DIAGNOSES: Same  PROCEDURES PERFORMED: Cataract extraction with intraocular lens implant, right eye.  SURGEON: Almon Hercules, M.D.  ANESTHESIA: MAC and topical  IMPLANTS: AcrySof IQ [id number] +24.5 D  Implant Name Type Inv. Item Serial No. Manufacturer Lot No. LRB No. Used  intraocular lens     [id number] ALCON   Right 1     COMPLICATIONS: None.  DESCRIPTION OF PROCEDURE: Therapeutic options were discussed with the patient preoperatively, including a discussion of risks and benefits of surgery. Informed consent was obtained. An IOL-Master and immersion biometry were used to take the lens measurements, and a dilated fundus exam was performed within 6 months of the surgical date.  The patient was premedicated and brought to the operating room and placed on the operating table in the supine position. After adequate anesthesia, the patient was prepped and draped in the usual sterile ophthalmic fashion. A wire lid speculum was inserted and the microscope was positioned. A Superblade was used to create a paracentesis site at the limbus and a small amount of dilute preservative free lidocaine was instilled into the anterior chamber, followed by dispersive viscoelastic. A clear corneal incision was created temporally using a 2.4 mm keratome blade. Capsulorrhexis was then performed. In situ phacoemulsification was performed.  Cortical material was removed with the irrigation-aspiration unit. Dispersive viscoelastic was instilled to open the capsular bag. A posterior chamber intraocular lens with the specifications above was inserted and positioned. Irrigation-aspiration was used to remove all viscoelastic. Cefuroxime 1cc was into the anterior chamber, and the corneal incision was checked and found to be water tight. The eyelid speculum was removed.  The operative eye was  covered with protective goggles after instilling 1 drop of timolol and brimonidine. The patient tolerated the procedure well. There were no complications.

## 2014-12-07 NOTE — Anesthesia Procedure Notes (Signed)
Procedure Name: MAC Performed by: Tamar Miano Pre-anesthesia Checklist: Patient identified, Emergency Drugs available, Suction available, Timeout performed and Patient being monitored Patient Re-evaluated:Patient Re-evaluated prior to inductionOxygen Delivery Method: Nasal cannula Placement Confirmation: positive ETCO2     

## 2014-12-07 NOTE — Anesthesia Postprocedure Evaluation (Signed)
  Anesthesia Post-op Note  Patient: Alison Warren  Procedure(s) Performed: Procedure(s) with comments: CATARACT EXTRACTION PHACO AND INTRAOCULAR LENS PLACEMENT (IOC) (Right) - DIABETIC - oral meds PER DR VIN START TIME 8AM  Anesthesia type:MAC  Patient location: PACU  Post pain: Pain level controlled  Post assessment: Post-op Vital signs reviewed, Patient's Cardiovascular Status Stable, Respiratory Function Stable, Patent Airway and No signs of Nausea or vomiting  Post vital signs: Reviewed and stable  Last Vitals:  Filed Vitals:   12/07/14 0839  BP: 131/84  Pulse: 69  Temp:   Resp: 19    Level of consciousness: awake, alert  and patient cooperative  Complications: No apparent anesthesia complications

## 2014-12-08 ENCOUNTER — Encounter: Payer: Self-pay | Admitting: Ophthalmology

## 2015-02-23 DIAGNOSIS — Q669 Congenital deformity of feet, unspecified, unspecified foot: Secondary | ICD-10-CM | POA: Insufficient documentation

## 2015-02-23 DIAGNOSIS — Z79899 Other long term (current) drug therapy: Secondary | ICD-10-CM | POA: Insufficient documentation

## 2015-06-07 ENCOUNTER — Emergency Department: Payer: Medicare Other

## 2015-06-07 ENCOUNTER — Emergency Department
Admission: EM | Admit: 2015-06-07 | Discharge: 2015-06-07 | Disposition: A | Discharge status: Home / Self Care | Payer: Medicare Other | Attending: Emergency Medicine | Admitting: Emergency Medicine

## 2015-06-07 DIAGNOSIS — Z79899 Other long term (current) drug therapy: Secondary | ICD-10-CM | POA: Insufficient documentation

## 2015-06-07 DIAGNOSIS — I10 Essential (primary) hypertension: Secondary | ICD-10-CM | POA: Insufficient documentation

## 2015-06-07 DIAGNOSIS — M25552 Pain in left hip: Secondary | ICD-10-CM | POA: Diagnosis present

## 2015-06-07 DIAGNOSIS — Z7984 Long term (current) use of oral hypoglycemic drugs: Secondary | ICD-10-CM | POA: Diagnosis not present

## 2015-06-07 DIAGNOSIS — Z7982 Long term (current) use of aspirin: Secondary | ICD-10-CM | POA: Insufficient documentation

## 2015-06-07 DIAGNOSIS — E119 Type 2 diabetes mellitus without complications: Secondary | ICD-10-CM | POA: Insufficient documentation

## 2015-06-07 DIAGNOSIS — M5432 Sciatica, left side: Secondary | ICD-10-CM | POA: Diagnosis not present

## 2015-06-07 LAB — URINALYSIS COMPLETE WITH MICROSCOPIC (ARMC ONLY)
BILIRUBIN URINE: NEGATIVE
Bacteria, UA: NONE SEEN
Glucose, UA: NEGATIVE mg/dL
Hgb urine dipstick: NEGATIVE
KETONES UR: NEGATIVE mg/dL
Leukocytes, UA: NEGATIVE
NITRITE: NEGATIVE
PH: 5 (ref 5.0–8.0)
Protein, ur: NEGATIVE mg/dL
Specific Gravity, Urine: 1.012 (ref 1.005–1.030)

## 2015-06-07 MED ORDER — ACETAMINOPHEN 500 MG PO TABS
1000.0000 mg | ORAL_TABLET | Freq: Once | ORAL | Status: AC
  Administered 2015-06-07: 1000 mg via ORAL
  Filled 2015-06-07: qty 2

## 2015-06-07 MED ORDER — ASPIRIN EC 325 MG PO TBEC
325.0000 mg | DELAYED_RELEASE_TABLET | Freq: Once | ORAL | Status: AC
  Administered 2015-06-07: 325 mg via ORAL
  Filled 2015-06-07: qty 1

## 2015-06-07 NOTE — ED Notes (Signed)
Pt came via EMS from home c/o left hip pain hat radiates to her back. Pt denies any injury/fall. Pt has history of diabetes and htn.

## 2015-06-07 NOTE — ED Notes (Signed)
Pt transported to xray 

## 2015-06-07 NOTE — Discharge Instructions (Signed)
Back Exercises The following exercises strengthen the muscles that help to support the back. They also help to keep the lower back flexible. Doing these exercises can help to prevent back pain or lessen existing pain. If you have back pain or discomfort, try doing these exercises 2-3 times each day or as told by your health care provider. When the pain goes away, do them once each day, but increase the number of times that you repeat the steps for each exercise (do more repetitions). If you do not have back pain or discomfort, do these exercises once each day or as told by your health care provider. EXERCISES Single Knee to Chest Repeat these steps 3-5 times for each leg:  Lie on your back on a firm bed or the floor with your legs extended.  Bring one knee to your chest. Your other leg should stay extended and in contact with the floor.  Hold your knee in place by grabbing your knee or thigh.  Pull on your knee until you feel a gentle stretch in your lower back.  Hold the stretch for 10-30 seconds.  Slowly release and straighten your leg. Pelvic Tilt Repeat these steps 5-10 times:  Lie on your back on a firm bed or the floor with your legs extended.  Bend your knees so they are pointing toward the ceiling and your feet are flat on the floor.  Tighten your lower abdominal muscles to press your lower back against the floor. This motion will tilt your pelvis so your tailbone points up toward the ceiling instead of pointing to your feet or the floor.  With gentle tension and even breathing, hold this position for 5-10 seconds. Cat-Cow Repeat these steps until your lower back becomes more flexible:  Get into a hands-and-knees position on a firm surface. Keep your hands under your shoulders, and keep your knees under your hips. You may place padding under your knees for comfort.  Let your head hang down, and point your tailbone toward the floor so your lower back becomes rounded like the  back of a cat.  Hold this position for 5 seconds.  Slowly lift your head and point your tailbone up toward the ceiling so your back forms a sagging arch like the back of a cow.  Hold this position for 5 seconds. Press-Ups Repeat these steps 5-10 times: 1. Lie on your abdomen (face-down) on the floor. 2. Place your palms near your head, about shoulder-width apart. 3. While you keep your back as relaxed as possible and keep your hips on the floor, slowly straighten your arms to raise the top half of your body and lift your shoulders. Do not use your back muscles to raise your upper torso. You may adjust the placement of your hands to make yourself more comfortable. 4. Hold this position for 5 seconds while you keep your back relaxed. 5. Slowly return to lying flat on the floor. Bridges Repeat these steps 10 times: 1. Lie on your back on a firm surface. 2. Bend your knees so they are pointing toward the ceiling and your feet are flat on the floor. 3. Tighten your buttocks muscles and lift your buttocks off of the floor until your waist is at almost the same height as your knees. You should feel the muscles working in your buttocks and the back of your thighs. If you do not feel these muscles, slide your feet 1-2 inches farther away from your buttocks. 4. Hold this position for 3-5  seconds. 5. Slowly lower your hips to the starting position, and allow your buttocks muscles to relax completely. If this exercise is too easy, try doing it with your arms crossed over your chest. Abdominal Crunches Repeat these steps 5-10 times: 1. Lie on your back on a firm bed or the floor with your legs extended. 2. Bend your knees so they are pointing toward the ceiling and your feet are flat on the floor. 3. Cross your arms over your chest. 4. Tip your chin slightly toward your chest without bending your neck. 5. Tighten your abdominal muscles and slowly raise your trunk (torso) high enough to lift your  shoulder blades a tiny bit off of the floor. Avoid raising your torso higher than that, because it can put too much stress on your low back and it does not help to strengthen your abdominal muscles. 6. Slowly return to your starting position. Back Lifts Repeat these steps 5-10 times: 1. Lie on your abdomen (face-down) with your arms at your sides, and rest your forehead on the floor. 2. Tighten the muscles in your legs and your buttocks. 3. Slowly lift your chest off of the floor while you keep your hips pressed to the floor. Keep the back of your head in line with the curve in your back. Your eyes should be looking at the floor. 4. Hold this position for 3-5 seconds. 5. Slowly return to your starting position. SEEK MEDICAL CARE IF:  Your back pain or discomfort gets much worse when you do an exercise.  Your back pain or discomfort does not lessen within 2 hours after you exercise. If you have any of these problems, stop doing these exercises right away. Do not do them again unless your health care provider says that you can. SEEK IMMEDIATE MEDICAL CARE IF:  You develop sudden, severe back pain. If this happens, stop doing the exercises right away. Do not do them again unless your health care provider says that you can.   This information is not intended to replace advice given to you by your health care provider. Make sure you discuss any questions you have with your health care provider.   Document Released: 05/04/2004 Document Revised: 12/16/2014 Document Reviewed: 05/21/2014 Elsevier Interactive Patient Education 2016 [employer]  Radicular Pain Radicular pain in either the arm or leg is usually from a bulging or herniated disk in the spine. A piece of the herniated disk may press against the nerves as the nerves exit the spine. This causes pain which is felt at the tips of the nerves down the arm or leg. Other causes of radicular pain may include:  Fractures.  Heart  disease.  Cancer.  An abnormal and usually degenerative state of the nervous system or nerves (neuropathy). Diagnosis may require CT or MRI scanning to determine the primary cause.  Nerves that start at the neck (nerve roots) may cause radicular pain in the outer shoulder and arm. It can spread down to the thumb and fingers. The symptoms vary depending on which nerve root has been affected. In most cases radicular pain improves with conservative treatment. Neck problems may require physical therapy, a neck collar, or cervical traction. Treatment may take many weeks, and surgery may be considered if the symptoms do not improve.  Conservative treatment is also recommended for sciatica. Sciatica causes pain to radiate from the lower back or buttock area down the leg into the foot. Often there is a history of back problems. Most patients with  sciatica are better after 2 to 4 weeks of rest and other supportive care. Short term bed rest can reduce the disk pressure considerably. Sitting, however, is not a good position since this increases the pressure on the disk. You should avoid bending, lifting, and all other activities which make the problem worse. Traction can be used in severe cases. Surgery is usually reserved for patients who do not improve within the first months of treatment. Only take over-the-counter or prescription medicines for pain, discomfort, or fever as directed by your caregiver. Narcotics and muscle relaxants may help by relieving more severe pain and spasm and by providing mild sedation. Cold or massage can give significant relief. Spinal manipulation is not recommended. It can increase the degree of disc protrusion. Epidural steroid injections are often effective treatment for radicular pain. These injections deliver medicine to the spinal nerve in the space between the protective covering of the spinal cord and back bones (vertebrae). Your caregiver can give you more information about  steroid injections. These injections are most effective when given within two weeks of the onset of pain.  You should see your caregiver for follow up care as recommended. A program for neck and back injury rehabilitation with stretching and strengthening exercises is an important part of management.  SEEK IMMEDIATE MEDICAL CARE IF:  You develop increased pain, weakness, or numbness in your arm or leg.  You develop difficulty with bladder or bowel control.  You develop abdominal pain.   This information is not intended to replace advice given to you by your health care provider. Make sure you discuss any questions you have with your health care provider.   Document Released: 05/04/2004 Document Revised: 04/17/2014 Document Reviewed: 10/21/2014 Elsevier Interactive Patient Education 2016 [employer]  Sciatica Sciatica is pain, weakness, numbness, or tingling along your sciatic nerve. The nerve starts in the lower back and runs down the back of each leg. Nerve damage or certain conditions pinch or put pressure on the sciatic nerve. This causes the pain, weakness, and other discomforts of sciatica. HOME CARE   Only take medicine as told by your doctor.  Apply ice to the affected area for 20 minutes. Do this 3-4 times a day for the first 48-72 hours. Then try heat in the same way.  Exercise, stretch, or do your usual activities if these do not make your pain worse.  Go to physical therapy as told by your doctor.  Keep all doctor visits as told.  Do not wear high heels or shoes that are not supportive.  Get a firm mattress if your mattress is too soft to lessen pain and discomfort. GET HELP RIGHT AWAY IF:   You cannot control when you poop (bowel movement) or pee (urinate).  You have more weakness in your lower back, lower belly (pelvis), butt (buttocks), or legs.  You have redness or puffiness (swelling) of your back.  You have a burning feeling when you pee.  You have pain  that gets worse when you lie down.  You have pain that wakes you from your sleep.  Your pain is worse than past pain.  Your pain lasts longer than 4 weeks.  You are suddenly losing weight without reason. MAKE SURE YOU:   Understand these instructions.  Will watch this condition.  Will get help right away if you are not doing well or get worse.   This information is not intended to replace advice given to you by your health care provider. Make  sure you discuss any questions you have with your health care provider.   Document Released: 01/04/2008 Document Revised: 12/16/2014 Document Reviewed: 08/06/2011 Elsevier Interactive Patient Education [employer].

## 2015-06-07 NOTE — ED Provider Notes (Signed)
[facility] Emergency Department Provider Note  ____________________________________________  Time seen: 7:55 AM on arrival by EMS  I have reviewed the triage vital signs and the nursing notes.   HISTORY  Chief Complaint Hip Pain    HPI Alison Warren is a 70 y.o. female who complains of gradual onset intermittent pain in the left hip which radiates from the left lower back down to the left thigh posteriorly. She's never had pain like this before. Compliant with her medications. Only takes Tylenol occasionally for pain. Denies any history of falls or trauma. No fever or chills nausea or vomiting. She is eating and drinking normally. She has noted increased urinary frequency recently which she attributes to her diabetes.  Pain is moderate intensity, intermittent, described as electric or lightening pain.   Past Medical History  Diagnosis Date  . Costochondritis   . GERD (gastroesophageal reflux disease)   . Hypertension   . Diabetes mellitus without complication ([place])   . Hypercholesteremia   . Arthritis   . Wears hearing aid     right ear  . Chronic cough   . Bronchitis     none currently     There are no active problems to display for this patient.    Past Surgical History  Procedure Laterality Date  . Mastoidectomy    . Cholecystectomy    . Colonoscopy    . Thumb amputation      removal of 2nd thumb  . Cataract extraction w/phaco Right 12/07/2014    Procedure: CATARACT EXTRACTION PHACO AND INTRAOCULAR LENS PLACEMENT (IOC);  Surgeon: Ronnell Freshwater, MD;  Location: [facility];  Service: Ophthalmology;  Laterality: Right;  DIABETIC - oral meds PER DR VIN START TIME 8AM     Current Outpatient Rx  Name  Route  Sig  Dispense  Refill  . acetaminophen (TYLENOL) 500 MG tablet   Oral   Take 500 mg by mouth every 6 (six) hours as needed.         Marland Kitchen amLODipine (NORVASC) 5 MG tablet   Oral   Take 5 mg by mouth daily. AM          . aspirin 81 MG tablet   Oral   Take 81 mg by mouth daily. AM         . carvedilol (COREG) 3.125 MG tablet   Oral   Take 6.25 mg by mouth daily.         . magnesium oxide (MAG-OX) 400 MG tablet   Oral   Take 400 mg by mouth daily. AM         . metFORMIN (GLUCOPHAGE) 1000 MG tablet   Oral   Take 1,000 mg by mouth daily. AM         . omeprazole (PRILOSEC) 40 MG capsule   Oral   Take 40 mg by mouth daily.            Allergies Tramadol and Motrin   No family history on file.  Social History Social History  Substance Use Topics  . Smoking status: Never Smoker   . Smokeless tobacco: None  . Alcohol Use: No    Review of Systems  Constitutional:   No fever or chills. No weight changes Eyes:   No blurry vision or double vision.  ENT:   No sore throat.  Cardiovascular:   No chest pain. Respiratory:   No dyspnea or cough. Gastrointestinal:   Negative for abdominal pain, vomiting and diarrhea.  No BRBPR or melena. Genitourinary:   Negative for dysuria or difficulty urinating. Increased frequency Musculoskeletal:   Left lower back pain and left hip pain. Skin:   Negative for rash. Neurological:   Negative for headaches, focal weakness or numbness. Psychiatric:  No anxiety or depression.   Endocrine:  No changes in energy or sleep difficulty.  10-point ROS otherwise negative.  ____________________________________________   PHYSICAL EXAM:  VITAL SIGNS: ED Triage Vitals  Enc Vitals Group     BP 06/07/15 0802 137/73 mmHg     Pulse Rate 06/07/15 0802 70     Resp 06/07/15 0802 16     Temp --      Temp src --      SpO2 06/07/15 0802 100 %     Weight 06/07/15 0802 184 lb 1.4 oz (83.5 kg)     Height 06/07/15 0802 5\' 4"  (1.626 m)     Head Cir --      Peak Flow --      Pain Score --      Pain Loc --      Pain Edu? --      Excl. in [place]? --     Vital signs reviewed, nursing assessments reviewed.   Constitutional:   Alert and oriented. Well  appearing and in no distress. Eyes:   No scleral icterus. No conjunctival pallor. PERRL. EOMI ENT   Head:   Normocephalic and atraumatic.   Nose:   No congestion/rhinnorhea. No septal hematoma   Mouth/Throat:   MMM, no pharyngeal erythema. No peritonsillar mass.    Neck:   No stridor. No SubQ emphysema. No meningismus. Hematological/Lymphatic/Immunilogical:   No cervical lymphadenopathy. Cardiovascular:   RRR. Symmetric bilateral radial and DP pulses.  No murmurs.  Respiratory:   Normal respiratory effort without tachypnea nor retractions. Breath sounds are clear and equal bilaterally. No wheezes/rales/rhonchi. Gastrointestinal:   Soft and nontender. Non distended. There is no CVA tenderness.  No rebound, rigidity, or guarding. Genitourinary:   deferred Musculoskeletal:   No midline spinal tenderness. No soft tissue or muscular tenderness in the lower back. No pain with rotation or flexion of the back. Pelvis is nontender and stable. There is some tenderness to the left hip over the greater trochanter. Straight leg raise is negative at 45. No pain with flexion of the hip or rotation of the hip. Other extremities are unaffected with full range of motion and nontender. Neurologic:   Normal speech and language.  CN 2-10 normal. Motor grossly intact. No gross focal neurologic deficits are appreciated.  Skin:    Skin is warm, dry and intact. No rash noted.  No petechiae, purpura, or bullae. Psychiatric:   Mood and affect are normal. ____________________________________________    LABS (pertinent positives/negatives) (all labs ordered are listed, but only abnormal results are displayed) Labs Reviewed  URINALYSIS COMPLETEWITH MICROSCOPIC ([place]) - Abnormal; Notable for the following:    Color, Urine YELLOW (*)    APPearance CLEAR (*)    Squamous Epithelial / LPF 0-5 (*)    All other components within normal limits    ____________________________________________   EKG    ____________________________________________    RADIOLOGY  X-ray lumbar spine shows multilevel degeneration with some scoliosis concave left. X-ray left hip unremarkable  ____________________________________________   PROCEDURES   ____________________________________________   INITIAL IMPRESSION / ASSESSMENT AND PLAN / ED COURSE  Pertinent labs & imaging results that were available during my care of the patient were reviewed by me and considered  in my medical decision making (see chart for details).  Patient presents with left back pain radiating to left hip and left posterior thigh. This is consistent with sciatica although the patient does not have a history in the past. The only exam finding is some mild tenderness over the greater trochanter. We'll get x-rays of the lumbar spine and left hip as well as check urinalysis due to greater urinary frequency. Patient is otherwise medically stable and ambulatory and suitable for outpatient follow-up pending results.  ----------------------------------------- 9:11 AM on 06/07/2015 -----------------------------------------  Workup negative. We'll treat as sciatica or radicular pain and have her follow-up with primary care. Low suspicion for epidural abscess or hematoma, cauda equina.  No evidence of any traumatic injury. Discharge home to follow up with primary care. Continue Tylenol. We'll give him a one-time dose of aspirin as well due to her reported mild ibuprofen allergy as I think there is a low risk for any cross reaction with aspirin as there may be with other NSAIDs. Vital signs remain stable and normal.     ____________________________________________   FINAL CLINICAL IMPRESSION(S) / ED DIAGNOSES  Final diagnoses:  Sciatica of left side      Carrie Mew, MD 06/07/15 [phone]

## 2015-08-24 DIAGNOSIS — J301 Allergic rhinitis due to pollen: Secondary | ICD-10-CM | POA: Insufficient documentation

## 2015-09-07 LAB — HM DEXA SCAN

## 2015-09-09 DIAGNOSIS — M8589 Other specified disorders of bone density and structure, multiple sites: Secondary | ICD-10-CM | POA: Insufficient documentation

## 2015-11-04 ENCOUNTER — Emergency Department: Payer: Medicare Other

## 2015-11-04 ENCOUNTER — Emergency Department
Admission: EM | Admit: 2015-11-04 | Discharge: 2015-11-04 | Disposition: A | Discharge status: Home / Self Care | Payer: Medicare Other | Attending: Emergency Medicine | Admitting: Emergency Medicine

## 2015-11-04 DIAGNOSIS — R1030 Lower abdominal pain, unspecified: Secondary | ICD-10-CM

## 2015-11-04 DIAGNOSIS — R1032 Left lower quadrant pain: Secondary | ICD-10-CM | POA: Diagnosis present

## 2015-11-04 DIAGNOSIS — Z7984 Long term (current) use of oral hypoglycemic drugs: Secondary | ICD-10-CM | POA: Insufficient documentation

## 2015-11-04 DIAGNOSIS — E119 Type 2 diabetes mellitus without complications: Secondary | ICD-10-CM | POA: Insufficient documentation

## 2015-11-04 DIAGNOSIS — R110 Nausea: Secondary | ICD-10-CM

## 2015-11-04 DIAGNOSIS — I10 Essential (primary) hypertension: Secondary | ICD-10-CM | POA: Diagnosis not present

## 2015-11-04 DIAGNOSIS — Z7982 Long term (current) use of aspirin: Secondary | ICD-10-CM | POA: Diagnosis not present

## 2015-11-04 DIAGNOSIS — K5732 Diverticulitis of large intestine without perforation or abscess without bleeding: Secondary | ICD-10-CM | POA: Diagnosis not present

## 2015-11-04 LAB — COMPREHENSIVE METABOLIC PANEL
ALBUMIN: 4 g/dL (ref 3.5–5.0)
ALT: 52 U/L (ref 14–54)
ANION GAP: 9 (ref 5–15)
AST: 51 U/L — ABNORMAL HIGH (ref 15–41)
Alkaline Phosphatase: 89 U/L (ref 38–126)
BILIRUBIN TOTAL: 0.9 mg/dL (ref 0.3–1.2)
BUN: 14 mg/dL (ref 6–20)
CO2: 23 mmol/L (ref 22–32)
Calcium: 9.9 mg/dL (ref 8.9–10.3)
Chloride: 103 mmol/L (ref 101–111)
Creatinine, Ser: 0.87 mg/dL (ref 0.44–1.00)
GFR calc Af Amer: >60 mL/min (ref >60)
GFR calc non Af Amer: >60 mL/min (ref >60)
GLUCOSE: 140 mg/dL — AB (ref 65–99)
POTASSIUM: 4.7 mmol/L (ref 3.5–5.1)
Sodium: 135 mmol/L (ref 135–145)
TOTAL PROTEIN: 7.3 g/dL (ref 6.5–8.1)

## 2015-11-04 LAB — URINALYSIS COMPLETE WITH MICROSCOPIC (ARMC ONLY)
BILIRUBIN URINE: NEGATIVE
Bacteria, UA: NONE SEEN
Glucose, UA: NEGATIVE mg/dL
HGB URINE DIPSTICK: NEGATIVE
KETONES UR: NEGATIVE mg/dL
LEUKOCYTES UA: NEGATIVE
Nitrite: NEGATIVE
PH: 6 (ref 5.0–8.0)
Protein, ur: NEGATIVE mg/dL
RBC / HPF: NONE SEEN RBC/hpf (ref 0–5)
SPECIFIC GRAVITY, URINE: 1.009 (ref 1.005–1.030)
SQUAMOUS EPITHELIAL / LPF: NONE SEEN
WBC, UA: NONE SEEN WBC/hpf (ref 0–5)

## 2015-11-04 LAB — CBC WITH DIFFERENTIAL/PLATELET
BASOS ABS: 0.3 10*3/uL — AB (ref 0–0.1)
BASOS PCT: 2 %
EOS ABS: 0.2 10*3/uL (ref 0–0.7)
Eosinophils Relative: 2 %
HEMATOCRIT: 45.5 % (ref 35.0–47.0)
HEMOGLOBIN: 15.3 g/dL (ref 12.0–16.0)
Lymphocytes Relative: 27 %
Lymphs Abs: 3.1 10*3/uL (ref 1.0–3.6)
MCH: 28.8 pg (ref 26.0–34.0)
MCHC: 33.5 g/dL (ref 32.0–36.0)
MCV: 85.9 fL (ref 80.0–100.0)
MONOS PCT: 6 %
Monocytes Absolute: 0.7 10*3/uL (ref 0.2–0.9)
NEUTROS ABS: 7.3 10*3/uL — AB (ref 1.4–6.5)
NEUTROS PCT: 63 %
Platelets: 241 10*3/uL (ref 150–440)
RBC: 5.3 MIL/uL — AB (ref 3.80–5.20)
RDW: 14.4 % (ref 11.5–14.5)
WBC: 11.7 10*3/uL — AB (ref 3.6–11.0)

## 2015-11-04 LAB — LIPASE, BLOOD: Lipase: 37 U/L (ref 11–51)

## 2015-11-04 LAB — TROPONIN I: Troponin I: <0.03 ng/mL (ref <0.03)

## 2015-11-04 MED ORDER — IOPAMIDOL (ISOVUE-300) INJECTION 61%
100.0000 mL | Freq: Once | INTRAVENOUS | Status: AC | PRN
  Administered 2015-11-04: 100 mL via INTRAVENOUS

## 2015-11-04 MED ORDER — MORPHINE SULFATE (PF) 2 MG/ML IV SOLN
2.0000 mg | Freq: Once | INTRAVENOUS | Status: AC
  Administered 2015-11-04: 2 mg via INTRAVENOUS
  Filled 2015-11-04: qty 1

## 2015-11-04 MED ORDER — OXYCODONE-ACETAMINOPHEN 5-325 MG PO TABS: 1.0000 | ORAL_TABLET | ORAL | 0 refills | Status: DC | PRN

## 2015-11-04 MED ORDER — MORPHINE SULFATE (PF) 2 MG/ML IV SOLN
INTRAVENOUS | Status: AC
  Filled 2015-11-04: qty 1

## 2015-11-04 MED ORDER — AMOXICILLIN-POT CLAVULANATE 875-125 MG PO TABS: 1.0000 | ORAL_TABLET | Freq: Two times a day (BID) | ORAL | 0 refills | Status: DC

## 2015-11-04 MED ORDER — OXYCODONE-ACETAMINOPHEN 5-325 MG PO TABS
1.0000 | ORAL_TABLET | Freq: Once | ORAL | Status: AC
  Administered 2015-11-04: 1 via ORAL
  Filled 2015-11-04: qty 1

## 2015-11-04 MED ORDER — ONDANSETRON 4 MG PO TBDP
4.0000 mg | ORAL_TABLET | Freq: Three times a day (TID) | ORAL | 0 refills | Status: DC | PRN
Start: 2015-11-04 — End: 2015-12-10

## 2015-11-04 MED ORDER — SODIUM CHLORIDE 0.9 % IV BOLUS (SEPSIS)
1000.0000 mL | Freq: Once | INTRAVENOUS | Status: AC
  Administered 2015-11-04: 1000 mL via INTRAVENOUS

## 2015-11-04 MED ORDER — DIATRIZOATE MEGLUMINE & SODIUM 66-10 % PO SOLN
15.0000 mL | ORAL | Status: AC
  Administered 2015-11-04 (×2): 15 mL via ORAL

## 2015-11-04 MED ORDER — MORPHINE SULFATE (PF) 2 MG/ML IV SOLN
2.0000 mg | Freq: Once | INTRAVENOUS | Status: AC
  Administered 2015-11-04: 2 mg via INTRAVENOUS

## 2015-11-04 MED ORDER — AMOXICILLIN-POT CLAVULANATE 875-125 MG PO TABS
1.0000 | ORAL_TABLET | Freq: Once | ORAL | Status: AC
  Administered 2015-11-04: 1 via ORAL
  Filled 2015-11-04: qty 1

## 2015-11-04 MED ORDER — ONDANSETRON 4 MG PO TBDP
4.0000 mg | ORAL_TABLET | Freq: Once | ORAL | Status: AC
  Administered 2015-11-04: 4 mg via ORAL
  Filled 2015-11-04: qty 1

## 2015-11-04 NOTE — ED Triage Notes (Signed)
Pt arrives to ED from home with c/o abdominal pain that started around 7pm last night. Pt reports h/x of colitis, states it feels like a flare-up. Pt arrives A&O, in mild pain distress, with respirations even, regular and unlabored. EMS reports given 4mg  Zofran while en route.

## 2015-11-04 NOTE — ED Provider Notes (Signed)
[facility] Emergency Department Provider Note   ____________________________________________   First MD Initiated Contact with Patient 11/04/15 0153     (approximate)  I have reviewed the triage vital signs and the nursing notes.   HISTORY  Chief Complaint Abdominal Pain    HPI Alison Warren is a 70 y.o. female who presents to the ED from home via EMS with a chief complaint of abdominal pain. Patient reports low abdominal pain which began approximately 7 PM. Describes nonradiating, sharp pain to lower abdomen associated with nausea only. Reports history of colitis and states this feels like a flareup. Patient denies fever, chills, chest pain, shortness of breath, vomiting, diarrhea. She was given Zofran IV en route which has improved her nausea.Denies recent travel, trauma or antibiotic use. Nothing makes her pain better or worse.   Past Medical History:  Diagnosis Date  . Arthritis   . Bronchitis    none currently  . Chronic cough   . Costochondritis   . Diabetes mellitus without complication ([place])   . GERD (gastroesophageal reflux disease)   . Hypercholesteremia   . Hypertension   . Wears hearing aid    right ear    There are no active problems to display for this patient.   Past Surgical History:  Procedure Laterality Date  . CATARACT EXTRACTION W/PHACO Right 12/07/2014   Procedure: CATARACT EXTRACTION PHACO AND INTRAOCULAR LENS PLACEMENT ([place]);  Surgeon: Ronnell Freshwater, MD;  Location: [place];  Service: Ophthalmology;  Laterality: Right;  DIABETIC - oral meds PER DR VIN START TIME 8AM  . CHOLECYSTECTOMY    . COLONOSCOPY    . MASTOIDECTOMY    . THUMB AMPUTATION     removal of 2nd thumb    Prior to Admission medications   Medication Sig Start Date End Date Taking? Authorizing Provider  acetaminophen (TYLENOL) 500 MG tablet Take 500 mg by mouth every 6 (six) hours as needed.    Historical Provider, MD    amLODipine (NORVASC) 5 MG tablet Take 5 mg by mouth daily. AM    Historical Provider, MD  aspirin 81 MG tablet Take 81 mg by mouth daily. AM    Historical Provider, MD  carvedilol (COREG) 3.125 MG tablet Take 6.25 mg by mouth daily.    Historical Provider, MD  magnesium oxide (MAG-OX) 400 MG tablet Take 400 mg by mouth daily. AM    Historical Provider, MD  metFORMIN (GLUCOPHAGE) 1000 MG tablet Take 1,000 mg by mouth daily. AM    Historical Provider, MD  omeprazole (PRILOSEC) 40 MG capsule Take 40 mg by mouth daily.    Historical Provider, MD    Allergies Tramadol and Motrin [ibuprofen]  No family history on file.  Social History Social History  Substance Use Topics  . Smoking status: Never Smoker  . Smokeless tobacco: Never Used  . Alcohol use No    Review of Systems  Constitutional: No fever/chills. Eyes: No visual changes. ENT: No sore throat. Cardiovascular: Denies chest pain. Respiratory: Denies shortness of breath. Gastrointestinal: Positive for abdominal pain.  Positive for nausea, no vomiting.  No diarrhea.  No constipation. Genitourinary: Negative for dysuria. Musculoskeletal: Negative for back pain. Skin: Negative for rash. Neurological: Negative for headaches, focal weakness or numbness.  10-point ROS otherwise negative.  ____________________________________________   PHYSICAL EXAM:  VITAL SIGNS: ED Triage Vitals  Enc Vitals Group     BP 11/04/15 0137 (!) 157/77     Pulse Rate 11/04/15 0137 71  Resp --      Temp 11/04/15 0137 98.6 F (37 C)     Temp Source 11/04/15 0137 Oral     SpO2 11/04/15 0137 96 %     Weight 11/04/15 0137 165 lb (74.8 kg)     Height 11/04/15 0137 5\' 4"  (1.626 m)     Head Circumference --      Peak Flow --      Pain Score 11/04/15 0138 10     Pain Loc --      Pain Edu? --      Excl. in [place]? --     Constitutional: Alert and oriented. Well appearing and in mild acute distress. Eyes: Conjunctivae are normal. PERRL.  EOMI. Head: Atraumatic. Nose: No congestion/rhinnorhea. Mouth/Throat: Mucous membranes are moist.  Oropharynx non-erythematous. Neck: No stridor.   Cardiovascular: Normal rate, regular rhythm. Grossly normal heart sounds.  Good peripheral circulation. Respiratory: Normal respiratory effort.  No retractions. Lungs CTAB. Gastrointestinal: Soft and mildly tender to palpation diffusely, maximally and left lower quadrant without rebound or guarding. No distention. No abdominal bruits. No CVA tenderness. Musculoskeletal: No lower extremity tenderness nor edema.  No joint effusions. Neurologic:  Normal speech and language. No gross focal neurologic deficits are appreciated. No gait instability. Skin:  Skin is warm, dry and intact. No rash noted. Psychiatric: Mood and affect are normal. Speech and behavior are normal.  ____________________________________________   LABS (all labs ordered are listed, but only abnormal results are displayed)  Labs Reviewed  CBC WITH DIFFERENTIAL/PLATELET - Abnormal; Notable for the following:       Result Value   WBC 11.7 (*)    RBC 5.30 (*)    Neutro Abs 7.3 (*)    Basophils Absolute 0.3 (*)    All other components within normal limits  COMPREHENSIVE METABOLIC PANEL - Abnormal; Notable for the following:    Glucose, Bld 140 (*)    AST 51 (*)    All other components within normal limits  URINALYSIS COMPLETEWITH MICROSCOPIC ([facility] ONLY) - Abnormal; Notable for the following:    Color, Urine STRAW (*)    APPearance CLEAR (*)    All other components within normal limits  LIPASE, BLOOD  TROPONIN I   ____________________________________________  EKG  ED ECG REPORT I, SUNG,JADE J, the attending physician, personally viewed and interpreted this ECG.   Date: 11/04/2015  EKG Time: 0133  Rate: 74  Rhythm: normal EKG, normal sinus rhythm  Axis: Normal  Intervals:none  ST&T Change:  Nonspecific  ____________________________________________  RADIOLOGY  CT abdomen/pelvis interpreted per Dr. Quintella Reichert: Sigmoid diverticulitis. No abscess or perforation. Cirrhosis. A 9 mm enhancing focus in the right lobe of the liver is not well characterized. MRI without and with contrast is recommended for further characterization. ____________________________________________   PROCEDURES  Procedure(s) performed: None  Procedures  Critical Care performed: No  ____________________________________________   INITIAL IMPRESSION / ASSESSMENT AND PLAN / ED COURSE  Pertinent labs & imaging results that were available during my care of the patient were reviewed by me and considered in my medical decision making (see chart for details).  70 year old female who presents with lower abdominal pain associated with nausea; history of colitis. Will obtain screening lab work including troponin, urinalysis and obtain CT abdomen/pelvis to evaluate intra-abdominal etiology.  Clinical Course  Comment By Time  Updated patient of CT imaging results. Overall she is feeling better. Given Cipro as recent black box warning, we'll treat diverticulitis with Augmentin twice a day 7 days.  Patient will have prescriptions for analgesia, antiemetic and she will follow up closely with her PCP. Strict return precautions given. Patient verbalizes understanding and agrees with plan of care. Paulette Blanch, MD 07/27 [phone]     ____________________________________________   FINAL CLINICAL IMPRESSION(S) / ED DIAGNOSES  Final diagnoses:  Lower abdominal pain  Nausea  Left lower quadrant pain  Diverticulitis of large intestine without perforation or abscess without bleeding      NEW MEDICATIONS STARTED DURING THIS VISIT:  New Prescriptions   No medications on file     Note:  This document was prepared using Dragon voice recognition software and may include unintentional dictation errors.    Paulette Blanch, MD 11/04/15 [phone]

## 2015-11-04 NOTE — ED Notes (Signed)
Pt ambulatory to toilet with no assist. This RN beside pt walking to toilet for comfort.

## 2015-11-04 NOTE — Discharge Instructions (Signed)
1. Take antibiotic as prescribed (Augmentin 875 mg twice daily 7 days). 2. You may take medicines as needed for pain and nausea (Percocet/Zofran #20). 3. Return to the ER for worsening symptoms, persistent vomiting, difficulty breathing or other concerns.

## 2015-11-09 ENCOUNTER — Other Ambulatory Visit: Payer: Self-pay | Admitting: Internal Medicine

## 2015-11-09 DIAGNOSIS — R7989 Other specified abnormal findings of blood chemistry: Secondary | ICD-10-CM

## 2015-11-09 DIAGNOSIS — R945 Abnormal results of liver function studies: Secondary | ICD-10-CM

## 2015-11-09 DIAGNOSIS — R160 Hepatomegaly, not elsewhere classified: Secondary | ICD-10-CM

## 2015-11-19 ENCOUNTER — Ambulatory Visit
Admission: RE | Admit: 2015-11-19 | Discharge: 2015-11-19 | Disposition: A | Discharge status: Home / Self Care | Payer: Medicare Other | Source: Ambulatory Visit | Attending: Internal Medicine | Admitting: Internal Medicine

## 2015-11-19 DIAGNOSIS — R7989 Other specified abnormal findings of blood chemistry: Secondary | ICD-10-CM | POA: Insufficient documentation

## 2015-11-19 DIAGNOSIS — R160 Hepatomegaly, not elsewhere classified: Secondary | ICD-10-CM | POA: Diagnosis present

## 2015-11-19 DIAGNOSIS — R945 Abnormal results of liver function studies: Secondary | ICD-10-CM

## 2015-11-19 MED ORDER — GADOBENATE DIMEGLUMINE 529 MG/ML IV SOLN
15.0000 mL | Freq: Once | INTRAVENOUS | Status: AC | PRN
  Administered 2015-11-19: 15 mL via INTRAVENOUS

## 2015-11-30 ENCOUNTER — Encounter: Payer: Self-pay | Admitting: Emergency Medicine

## 2015-11-30 ENCOUNTER — Ambulatory Visit
Admission: EM | Admit: 2015-11-30 | Discharge: 2015-11-30 | Disposition: A | Discharge status: Home / Self Care | Payer: Medicare Other | Attending: Family Medicine | Admitting: Family Medicine

## 2015-11-30 DIAGNOSIS — K5732 Diverticulitis of large intestine without perforation or abscess without bleeding: Secondary | ICD-10-CM | POA: Diagnosis not present

## 2015-11-30 LAB — URINALYSIS COMPLETE WITH MICROSCOPIC (ARMC ONLY)
BILIRUBIN URINE: NEGATIVE
Glucose, UA: NEGATIVE mg/dL
Hgb urine dipstick: NEGATIVE
KETONES UR: NEGATIVE mg/dL
NITRITE: NEGATIVE
PH: 5 (ref 5.0–8.0)
Protein, ur: NEGATIVE mg/dL
RBC / HPF: NONE SEEN RBC/hpf (ref 0–5)
Specific Gravity, Urine: 1.01 (ref 1.005–1.030)

## 2015-11-30 MED ORDER — METRONIDAZOLE 500 MG PO TABS: 500.0000 mg | ORAL_TABLET | Freq: Three times a day (TID) | ORAL | 0 refills | Status: DC

## 2015-11-30 MED ORDER — AMOXICILLIN-POT CLAVULANATE 875-125 MG PO TABS: 1.0000 | ORAL_TABLET | Freq: Two times a day (BID) | ORAL | 0 refills | Status: DC

## 2015-11-30 NOTE — ED Triage Notes (Signed)
Patient c/o abdominal pain for the past 3 weeks.  Patient states that it has not gotten better and has gotten worse over the past week.  Patient previously on antibiotic for this.  Patient denies N/V/D.  Patient reports normal bowel movement this morning.

## 2015-11-30 NOTE — ED Provider Notes (Signed)
CSN: [id number]     Arrival date & time 11/30/15  1207 History   First MD Initiated Contact with Patient 11/30/15 1255     Chief Complaint  Patient presents with  . Abdominal Pain   (Consider location/radiation/quality/duration/timing/severity/associated sxs/prior Treatment) HPI  70 year old female who presents with lower quadrant pain and present for 3 weeks but has gotten worse over the past week. In review of old medical records the patient has a history of sigmoid diverticulitis that has been treated in July with Augmentin required a second prescription according to the patient by her primary care physician. Also has recently undergone MRI of the liver which showed the suspicion of cirrhosis and a vascular lesion that that is being followed by gastroenterologist. She states that this time does not seem to be as severe as her other time but now is seemingly increasing in pain. She also has had left hip pain with an x-ray was performed in February 2017 which was normal. She's had some nausea but no vomiting and no diarrhea. Has had no urinary tract symptoms, burning, discharge.       Past Medical History:  Diagnosis Date  . Arthritis   . Bronchitis    none currently  . Chronic cough   . Costochondritis   . Diabetes mellitus without complication ([place])   . GERD (gastroesophageal reflux disease)   . Hypercholesteremia   . Hypertension   . Wears hearing aid    right ear   Past Surgical History:  Procedure Laterality Date  . CATARACT EXTRACTION W/PHACO Right 12/07/2014   Procedure: CATARACT EXTRACTION PHACO AND INTRAOCULAR LENS PLACEMENT ([place]);  Surgeon: Ronnell Freshwater, MD;  Location: [place];  Service: Ophthalmology;  Laterality: Right;  DIABETIC - oral meds PER DR VIN START TIME 8AM  . CHOLECYSTECTOMY    . COLONOSCOPY    . MASTOIDECTOMY    . THUMB AMPUTATION     removal of 2nd thumb   History reviewed. No pertinent family history. Social History   Substance Use Topics  . Smoking status: Never Smoker  . Smokeless tobacco: Never Used  . Alcohol use No   OB History    No data available     Review of Systems  Constitutional: Positive for activity change. Negative for appetite change, chills, diaphoresis, fatigue and fever.  Gastrointestinal: Positive for abdominal pain and nausea. Negative for anal bleeding, blood in stool, constipation, diarrhea, rectal pain and vomiting.  All other systems reviewed and are negative.   Allergies  Tramadol and Motrin [ibuprofen]  Home Medications   Prior to Admission medications   Medication Sig Start Date End Date Taking? Authorizing Provider  acetaminophen (TYLENOL) 500 MG tablet Take 500 mg by mouth every 6 (six) hours as needed.    Historical Provider, MD  amLODipine (NORVASC) 5 MG tablet Take 5 mg by mouth daily. AM    Historical Provider, MD  amoxicillin-clavulanate (AUGMENTIN) 875-125 MG tablet Take 1 tablet by mouth every 12 (twelve) hours. 11/30/15   Lorin Picket, PA-C  aspirin 81 MG tablet Take 81 mg by mouth daily. AM    Historical Provider, MD  carvedilol (COREG) 3.125 MG tablet Take 6.25 mg by mouth daily.    Historical Provider, MD  magnesium oxide (MAG-OX) 400 MG tablet Take 400 mg by mouth daily. AM    Historical Provider, MD  metFORMIN (GLUCOPHAGE) 1000 MG tablet Take 1,000 mg by mouth daily. AM    Historical Provider, MD  metroNIDAZOLE (FLAGYL) 500 MG tablet  Take 1 tablet (500 mg total) by mouth 3 (three) times daily. 11/30/15   Lorin Picket, PA-C  omeprazole (PRILOSEC) 40 MG capsule Take 40 mg by mouth daily.    Historical Provider, MD  ondansetron (ZOFRAN ODT) 4 MG disintegrating tablet Take 1 tablet (4 mg total) by mouth every 8 (eight) hours as needed for nausea or vomiting. 11/04/15   Paulette Blanch, MD  oxyCODONE-acetaminophen (ROXICET) 5-325 MG tablet Take 1 tablet by mouth every 4 (four) hours as needed for severe pain. 11/04/15   Paulette Blanch, MD   Meds Ordered and  Administered this Visit  Medications - No data to display  BP (!) 142/79 (BP Location: Left Arm)   Pulse 60   Temp 97.1 F (36.2 C) (Tympanic)   Resp 17   Ht 5\' 4"  (1.626 m)   Wt 165 lb (74.8 kg)   SpO2 96%   BMI 28.32 kg/m  No data found.   Physical Exam  Constitutional: She appears well-developed and well-nourished. No distress.  HENT:  Head: Normocephalic and atraumatic.  Eyes: EOM are normal. Pupils are equal, round, and reactive to light.  Neck: Normal range of motion. Neck supple.  Pulmonary/Chest: Effort normal and breath sounds normal. No respiratory distress. She has no wheezes. She has no rales.  Abdominal: Soft. Bowel sounds are normal. She exhibits no distension and no mass. There is tenderness. There is no rebound and no guarding.  Examination was performed in the presence of Kia, Stage manager as chaperone. Abdomen-shows no distention. The patient is obese. Bowel sounds Are present in all quadrants. The abdomen is soft there is tenderness maximal in the left lower quadrant which reproduces her symptoms. There is no rebound no guarding present.  Skin: She is not diaphoretic.  Nursing note and vitals reviewed.   Urgent Care Course   Clinical Course    Procedures (including critical care time)  Labs Review Labs Reviewed  URINALYSIS COMPLETEWITH MICROSCOPIC ([facility] ONLY) - Abnormal; Notable for the following:       Result Value   Leukocytes, UA TRACE (*)    Bacteria, UA FEW (*)    Squamous Epithelial / LPF 0-5 (*)    All other components within normal limits    Imaging Review No results found.   Visual Acuity Review  Right Eye Distance:   Left Eye Distance:   Bilateral Distance:    Right Eye Near:   Left Eye Near:    Bilateral Near:         MDM   1. Diverticulitis of sigmoid colon    Discharge Medication List as of 11/30/2015  1:53 PM    START taking these medications   Details  amoxicillin-clavulanate (AUGMENTIN) 875-125 MG tablet Take  1 tablet by mouth every 12 (twelve) hours., Starting Tue 11/30/2015, Normal    metroNIDAZOLE (FLAGYL) 500 MG tablet Take 1 tablet (500 mg total) by mouth 3 (three) times daily., Starting Tue 11/30/2015, Normal      Plan: 1. Test/x-ray results and diagnosis reviewed with patient 2. rx as per orders; risks, benefits, potential side effects reviewed with patient 3. Recommend supportive treatment with high fiber diet. Instructions were provided to the patient. She has confirmed sigmoid diverticulitis along with the left lower quadrant exam and no evidence of a surgical abdomen nor treat her with antibiotics at this point time. I recommended that she follow-up with her gastroenterologist this week or next and Dr. Dorthula Perfect in the same timeframe. If she worsens or  is not improving she should go to the emergency department. 4. F/u prn if symptoms worsen or don't improve     Lorin Picket, PA-C 11/30/15 1359

## 2015-12-01 ENCOUNTER — Emergency Department: Payer: Medicare Other

## 2015-12-01 ENCOUNTER — Emergency Department
Admission: EM | Admit: 2015-12-01 | Discharge: 2015-12-01 | Disposition: A | Discharge status: Home / Self Care | Payer: Medicare Other | Attending: Emergency Medicine | Admitting: Emergency Medicine

## 2015-12-01 ENCOUNTER — Encounter: Payer: Self-pay | Admitting: *Deleted

## 2015-12-01 DIAGNOSIS — Z7982 Long term (current) use of aspirin: Secondary | ICD-10-CM | POA: Diagnosis not present

## 2015-12-01 DIAGNOSIS — Z7984 Long term (current) use of oral hypoglycemic drugs: Secondary | ICD-10-CM | POA: Diagnosis not present

## 2015-12-01 DIAGNOSIS — K5792 Diverticulitis of intestine, part unspecified, without perforation or abscess without bleeding: Secondary | ICD-10-CM | POA: Insufficient documentation

## 2015-12-01 DIAGNOSIS — Z791 Long term (current) use of non-steroidal anti-inflammatories (NSAID): Secondary | ICD-10-CM | POA: Insufficient documentation

## 2015-12-01 DIAGNOSIS — I10 Essential (primary) hypertension: Secondary | ICD-10-CM | POA: Diagnosis not present

## 2015-12-01 DIAGNOSIS — R1032 Left lower quadrant pain: Secondary | ICD-10-CM | POA: Diagnosis present

## 2015-12-01 DIAGNOSIS — E119 Type 2 diabetes mellitus without complications: Secondary | ICD-10-CM | POA: Diagnosis not present

## 2015-12-01 LAB — COMPREHENSIVE METABOLIC PANEL
ALK PHOS: 85 U/L (ref 38–126)
ALT: 40 U/L (ref 14–54)
AST: 35 U/L (ref 15–41)
Albumin: 3.9 g/dL (ref 3.5–5.0)
Anion gap: 8 (ref 5–15)
BILIRUBIN TOTAL: 0.4 mg/dL (ref 0.3–1.2)
BUN: 15 mg/dL (ref 6–20)
CO2: 25 mmol/L (ref 22–32)
CREATININE: 0.78 mg/dL (ref 0.44–1.00)
Calcium: 9.5 mg/dL (ref 8.9–10.3)
Chloride: 105 mmol/L (ref 101–111)
GFR calc Af Amer: >60 mL/min (ref >60)
Glucose, Bld: 134 mg/dL — ABNORMAL HIGH (ref 65–99)
Potassium: 3.6 mmol/L (ref 3.5–5.1)
Sodium: 138 mmol/L (ref 135–145)
Total Protein: 7.1 g/dL (ref 6.5–8.1)

## 2015-12-01 LAB — CBC
HEMATOCRIT: 42.2 % (ref 35.0–47.0)
Hemoglobin: 14.5 g/dL (ref 12.0–16.0)
MCH: 29.2 pg (ref 26.0–34.0)
MCHC: 34.4 g/dL (ref 32.0–36.0)
MCV: 84.8 fL (ref 80.0–100.0)
Platelets: 213 10*3/uL (ref 150–440)
RBC: 4.98 MIL/uL (ref 3.80–5.20)
RDW: 14.1 % (ref 11.5–14.5)
WBC: 7.4 10*3/uL (ref 3.6–11.0)

## 2015-12-01 MED ORDER — MORPHINE SULFATE (PF) 4 MG/ML IV SOLN
INTRAVENOUS | Status: AC
  Administered 2015-12-01: 4 mg via INTRAVENOUS
  Filled 2015-12-01: qty 1

## 2015-12-01 MED ORDER — MORPHINE SULFATE (PF) 2 MG/ML IV SOLN
INTRAVENOUS | Status: AC
  Administered 2015-12-01: 2 mg via INTRAVENOUS
  Filled 2015-12-01: qty 1

## 2015-12-01 MED ORDER — ONDANSETRON HCL 4 MG/2ML IJ SOLN
4.0000 mg | Freq: Once | INTRAMUSCULAR | Status: AC
  Administered 2015-12-01: 4 mg via INTRAVENOUS

## 2015-12-01 MED ORDER — MORPHINE SULFATE (PF) 2 MG/ML IV SOLN
2.0000 mg | Freq: Once | INTRAVENOUS | Status: AC
  Administered 2015-12-01: 2 mg via INTRAVENOUS

## 2015-12-01 MED ORDER — IOPAMIDOL (ISOVUE-300) INJECTION 61%
100.0000 mL | Freq: Once | INTRAVENOUS | Status: AC | PRN
  Administered 2015-12-01: 100 mL via INTRAVENOUS

## 2015-12-01 MED ORDER — DIATRIZOATE MEGLUMINE & SODIUM 66-10 % PO SOLN
15.0000 mL | ORAL | Status: AC
  Administered 2015-12-01 (×2): 15 mL via ORAL

## 2015-12-01 MED ORDER — ONDANSETRON HCL 4 MG/2ML IJ SOLN
INTRAMUSCULAR | Status: AC
  Administered 2015-12-01: 4 mg via INTRAVENOUS
  Filled 2015-12-01: qty 2

## 2015-12-01 MED ORDER — OXYCODONE-ACETAMINOPHEN 5-325 MG PO TABS: 1.0000 | ORAL_TABLET | ORAL | 0 refills | Status: DC | PRN

## 2015-12-01 MED ORDER — ONDANSETRON 4 MG PO TBDP: 4.0000 mg | ORAL_TABLET | Freq: Three times a day (TID) | ORAL | 0 refills | Status: DC | PRN

## 2015-12-01 MED ORDER — MORPHINE SULFATE (PF) 4 MG/ML IV SOLN
4.0000 mg | Freq: Once | INTRAVENOUS | Status: AC
Start: 2015-12-01 — End: 2015-12-01
  Administered 2015-12-01: 4 mg via INTRAVENOUS

## 2015-12-01 NOTE — ED Notes (Signed)
MD at bedside. 

## 2015-12-01 NOTE — ED Notes (Signed)
Discharge instructions reviewed with patient. Patient verbalized understanding. Patient taken to lobby via wheelchair and helped into vehicle without difficulty.  

## 2015-12-01 NOTE — ED Triage Notes (Signed)
PER [facility]: pt. Saw GI and was dx. With diverticulitis, sent to Urgent Care, sent home with antibiotics. Pt. Called EMS to home to come to the ER for pain control. Pt. C/o LLQ abdominal pain.

## 2015-12-01 NOTE — ED Provider Notes (Signed)
[facility] Emergency Department Provider Note  ____________________________________________   None    (approximate)  I have reviewed the triage vital signs and the nursing notes.   HISTORY  Chief Complaint Abdominal Pain   HPI Alison Warren is a 70 y.o. female with history of diverticulitis seen by GI and urgent care today. Patient states she wants her gastrologist to stated that she might have diverticulitis and referred her to urgent care where she was given Flagyl and amoxicillin. Patient states however that pain progressively worsened over the course of day and currently 10 out of 10 located in left and right lower quadrant of the abdomen. Patient denies any fever no vomiting.   Past Medical History:  Diagnosis Date  . Arthritis   . Bronchitis    none currently  . Chronic cough   . Costochondritis   . Diabetes mellitus without complication ([place])   . GERD (gastroesophageal reflux disease)   . Hypercholesteremia   . Hypertension   . Wears hearing aid    right ear    There are no active problems to display for this patient.   Past Surgical History:  Procedure Laterality Date  . CATARACT EXTRACTION W/PHACO Right 12/07/2014   Procedure: CATARACT EXTRACTION PHACO AND INTRAOCULAR LENS PLACEMENT ([place]);  Surgeon: Ronnell Freshwater, MD;  Location: [place];  Service: Ophthalmology;  Laterality: Right;  DIABETIC - oral meds PER DR VIN START TIME 8AM  . CHOLECYSTECTOMY    . COLONOSCOPY    . MASTOIDECTOMY    . THUMB AMPUTATION     removal of 2nd thumb    Prior to Admission medications   Medication Sig Start Date End Date Taking? Authorizing Provider  acetaminophen (TYLENOL) 500 MG tablet Take 500 mg by mouth every 6 (six) hours as needed.    Historical Provider, MD  amLODipine (NORVASC) 5 MG tablet Take 5 mg by mouth daily. AM    Historical Provider, MD  amoxicillin-clavulanate (AUGMENTIN) 875-125 MG tablet Take 1 tablet by  mouth every 12 (twelve) hours. 11/30/15   Lorin Picket, PA-C  aspirin 81 MG tablet Take 81 mg by mouth daily. AM    Historical Provider, MD  carvedilol (COREG) 3.125 MG tablet Take 6.25 mg by mouth daily.    Historical Provider, MD  magnesium oxide (MAG-OX) 400 MG tablet Take 400 mg by mouth daily. AM    Historical Provider, MD  metFORMIN (GLUCOPHAGE) 1000 MG tablet Take 1,000 mg by mouth daily. AM    Historical Provider, MD  metroNIDAZOLE (FLAGYL) 500 MG tablet Take 1 tablet (500 mg total) by mouth 3 (three) times daily. 11/30/15   Lorin Picket, PA-C  omeprazole (PRILOSEC) 40 MG capsule Take 40 mg by mouth daily.    Historical Provider, MD  ondansetron (ZOFRAN ODT) 4 MG disintegrating tablet Take 1 tablet (4 mg total) by mouth every 8 (eight) hours as needed for nausea or vomiting. 11/04/15   Paulette Blanch, MD  oxyCODONE-acetaminophen (ROXICET) 5-325 MG tablet Take 1 tablet by mouth every 4 (four) hours as needed for severe pain. 11/04/15   Paulette Blanch, MD    Allergies Tramadol and Motrin [ibuprofen]  History reviewed. No pertinent family history.  Social History Social History  Substance Use Topics  . Smoking status: Never Smoker  . Smokeless tobacco: Never Used  . Alcohol use No    Review of Systems Constitutional: No fever/chills Eyes: No visual changes. ENT: No sore throat. Cardiovascular: Denies chest pain.  Respiratory: Denies shortness of breath. Gastrointestinal: Positive for abdominal pain. No nausea, no vomiting.  No diarrhea.  No constipation. Genitourinary: Negative for dysuria. Musculoskeletal: Negative for back pain. Skin: Negative for rash. Neurological: Negative for headaches, focal weakness or numbness.  10-point ROS otherwise negative.  ____________________________________________   PHYSICAL EXAM:  VITAL SIGNS: ED Triage Vitals  Enc Vitals Group     BP 12/01/15 0038 (!) 149/83     Pulse Rate 12/01/15 0043 70     Resp 12/01/15 0043 (!) 22     Temp  12/01/15 0043 97.6 F (36.4 C)     Temp Source 12/01/15 0043 Oral     SpO2 12/01/15 0043 95 %     Weight 12/01/15 0044 165 lb (74.8 kg)     Height 12/01/15 0044 5\' 4"  (1.626 m)     Head Circumference --      Peak Flow --      Pain Score 12/01/15 0044 10     Pain Loc --      Pain Edu? --      Excl. in [place]? --    Constitutional: Alert and oriented. Well appearing and in no acute distress. Eyes: Conjunctivae are normal. PERRL. EOMI. Head: Atraumatic. Mouth/Throat: Mucous membranes are moist.  Oropharynx non-erythematous. Neck: No stridor.  No meningeal signs. Cardiovascular: Normal rate, regular rhythm. Good peripheral circulation. Grossly normal heart sounds. Respiratory: Normal respiratory effort.  No retractions. Lungs CTAB. Gastrointestinal: Left lower quadrant tenderness to palpation. No distention.  Musculoskeletal: No lower extremity tenderness nor edema. No gross deformities of extremities. Neurologic:  Normal speech and language. No gross focal neurologic deficits are appreciated.  Skin:  Skin is warm, dry and intact. No rash noted. Psychiatric: Mood and affect are normal. Speech and behavior are normal.  ____________________________________________   LABS (all labs ordered are listed, but only abnormal results are displayed)  Labs Reviewed  CBC  COMPREHENSIVE METABOLIC PANEL   _________________________________  RADIOLOGY I, [place], personally viewed and evaluated these images (plain radiographs) as part of my medical decision making, as well as reviewing the written report by the radiologist.  No results found.   Procedures     INITIAL IMPRESSION / ASSESSMENT AND PLAN / ED COURSE  Pertinent labs & imaging results that were available during my care of the patient were reviewed by me and considered in my medical decision making (see chart for details).    Clinical Course    ____________________________________________  FINAL CLINICAL  IMPRESSION(S) / ED DIAGNOSES  Final diagnoses:  Acute diverticulitis     MEDICATIONS GIVEN DURING THIS VISIT:  Medications  morphine 2 MG/ML injection (not administered)  ondansetron (ZOFRAN) 4 MG/2ML injection (not administered)  morphine 2 MG/ML injection 2 mg (not administered)  ondansetron (ZOFRAN) injection 4 mg (not administered)     NEW OUTPATIENT MEDICATIONS STARTED DURING THIS VISIT:  New Prescriptions   No medications on file      Note:  This document was prepared using Dragon voice recognition software and may include unintentional dictation errors.    Gregor Hams, MD 12/01/15 [phone]

## 2015-12-10 ENCOUNTER — Ambulatory Visit
Admission: EM | Admit: 2015-12-10 | Discharge: 2015-12-10 | Disposition: A | Discharge status: Home / Self Care | Payer: Medicare Other | Attending: Emergency Medicine | Admitting: Emergency Medicine

## 2015-12-10 DIAGNOSIS — R748 Abnormal levels of other serum enzymes: Secondary | ICD-10-CM | POA: Diagnosis not present

## 2015-12-10 DIAGNOSIS — K5792 Diverticulitis of intestine, part unspecified, without perforation or abscess without bleeding: Secondary | ICD-10-CM

## 2015-12-10 HISTORY — DX: Diverticulitis of intestine, part unspecified, without perforation or abscess without bleeding: K57.92

## 2015-12-10 LAB — CBC WITH DIFFERENTIAL/PLATELET
BASOS ABS: 0.1 10*3/uL (ref 0–0.1)
BASOS PCT: 2 %
EOS ABS: 0.1 10*3/uL (ref 0–0.7)
Eosinophils Relative: 1 %
HCT: 47.2 % — ABNORMAL HIGH (ref 35.0–47.0)
HEMOGLOBIN: 15.7 g/dL (ref 12.0–16.0)
Lymphocytes Relative: 28 %
Lymphs Abs: 2.4 10*3/uL (ref 1.0–3.6)
MCH: 28.7 pg (ref 26.0–34.0)
MCHC: 33.3 g/dL (ref 32.0–36.0)
MCV: 86.2 fL (ref 80.0–100.0)
MONOS PCT: 7 %
Monocytes Absolute: 0.6 10*3/uL (ref 0.2–0.9)
NEUTROS PCT: 62 %
Neutro Abs: 5.4 10*3/uL (ref 1.4–6.5)
Platelets: 244 10*3/uL (ref 150–440)
RBC: 5.47 MIL/uL — AB (ref 3.80–5.20)
RDW: 14.5 % (ref 11.5–14.5)
WBC: 8.6 10*3/uL (ref 3.6–11.0)

## 2015-12-10 LAB — COMPREHENSIVE METABOLIC PANEL
ALBUMIN: 4.3 g/dL (ref 3.5–5.0)
ALK PHOS: 83 U/L (ref 38–126)
ALT: 59 U/L — ABNORMAL HIGH (ref 14–54)
ANION GAP: 10 (ref 5–15)
AST: 72 U/L — AB (ref 15–41)
BUN: 12 mg/dL (ref 6–20)
CALCIUM: 9.9 mg/dL (ref 8.9–10.3)
CO2: 27 mmol/L (ref 22–32)
Chloride: 100 mmol/L — ABNORMAL LOW (ref 101–111)
Creatinine, Ser: 0.76 mg/dL (ref 0.44–1.00)
GFR calc Af Amer: >60 mL/min (ref >60)
GFR calc non Af Amer: >60 mL/min (ref >60)
GLUCOSE: 131 mg/dL — AB (ref 65–99)
POTASSIUM: 4.4 mmol/L (ref 3.5–5.1)
SODIUM: 137 mmol/L (ref 135–145)
Total Bilirubin: 0.6 mg/dL (ref 0.3–1.2)
Total Protein: 7.9 g/dL (ref 6.5–8.1)

## 2015-12-10 LAB — URINALYSIS COMPLETE WITH MICROSCOPIC (ARMC ONLY)
BACTERIA UA: NONE SEEN
Bilirubin Urine: NEGATIVE
Glucose, UA: NEGATIVE mg/dL
HGB URINE DIPSTICK: NEGATIVE
Ketones, ur: NEGATIVE mg/dL
LEUKOCYTES UA: NEGATIVE
NITRITE: NEGATIVE
PH: 5 (ref 5.0–8.0)
PROTEIN: NEGATIVE mg/dL
Specific Gravity, Urine: 1.01 (ref 1.005–1.030)

## 2015-12-10 MED ORDER — METRONIDAZOLE 500 MG PO TABS: 500.0000 mg | ORAL_TABLET | Freq: Three times a day (TID) | ORAL | 0 refills | Status: DC

## 2015-12-10 MED ORDER — OXYCODONE-ACETAMINOPHEN 5-325 MG PO TABS: 0.5000 | ORAL_TABLET | Freq: Four times a day (QID) | ORAL | 0 refills | Status: DC | PRN

## 2015-12-10 MED ORDER — CIPROFLOXACIN HCL 500 MG PO TABS: 500.0000 mg | ORAL_TABLET | Freq: Two times a day (BID) | ORAL | 0 refills | Status: DC

## 2015-12-10 NOTE — Discharge Instructions (Signed)
Only take Tylenol as needed for mild to moderate pain. you may take one half of the Percocet as needed for severe pain. If you need any more Percocet than this, then you need to go to the emergency room. Go to the emergency room for fevers above 100.4, if your belly pain changes or gets worse, abdominal distention, nausea or vomiting, blood in your stool, black and tarry stools, and for the other signs and symptoms we discussed. You need to follow-up with your primary care physician to have her liver enzymes rechecked in one week. They're slightly elevated today.

## 2015-12-10 NOTE — ED Provider Notes (Signed)
HPI  SUBJECTIVE:  Alison Warren is a 70 y.o. female who presents for "more medicine". She states that she has continued left lower quadrant pain that was getting better, but then got a little worse yesterday. She states that the pain is bearable and not as severe as it was when she was seen here on 8/22 and in the ED on 8/23. She describes the pain as sharp, constant, nonmigratory. Symptoms are better with Percocet, worse with trying to defecate. She has not tried anything else for this. She denies nausea, vomiting, fevers, abdominal distention. She had a slightly watery, nonbloody, non-tarry bowel movement today. She states the pain is not associated with movement, eating, fasting, urination. She has no urinary complaints. No vaginal bleeding, discharge. No pelvic or back pain. No anorexia. States that the car ride over here was okay. No antipyretic in the past 6-8 hours.  Patient was seen here on 8/22 for presumed diverticulitis, she was started on a 7 day course of Flagyl and augmentin, however, her pain got worse that night, and she went to the ED. She had a repeat CT of her abdomen pelvis that night which showed improved but continued inflammation along the descending sigmoid junction.  There was no abscess or perforation. Cirrhosis was again noted.  She had a CT of abdomen and pelvis on 11/04/2015 for abd pain.  This showed cirrhosis, extensive sigmoid diverticulosis, acute sigmoid diverticulitis, but no fluid collection, abscess, evidence of diverticular perforation.  She has a past medical history of diverticulitis, diabetes. She states that her glucose has been running within normal limits for her. Also hypertension, back fracture, status post cholecystectomy. No history of kidney disease. PMD: Dr. Dorthula Perfect at [facility]. She has a GI appointment at the [facility] on 9/19.    Past Medical History:  Diagnosis Date  . Arthritis   . Bronchitis    none currently  . Chronic cough   .  Costochondritis   . Diabetes mellitus without complication ([place])   . Diverticulitis   . GERD (gastroesophageal reflux disease)   . Hypercholesteremia   . Hypertension   . Wears hearing aid    right ear    Past Surgical History:  Procedure Laterality Date  . CATARACT EXTRACTION W/PHACO Right 12/07/2014   Procedure: CATARACT EXTRACTION PHACO AND INTRAOCULAR LENS PLACEMENT ([place]);  Surgeon: Ronnell Freshwater, MD;  Location: [facility];  Service: Ophthalmology;  Laterality: Right;  DIABETIC - oral meds PER DR VIN START TIME 8AM  . CHOLECYSTECTOMY    . COLONOSCOPY    . MASTOIDECTOMY    . THUMB AMPUTATION     removal of 2nd thumb    History reviewed. No pertinent family history.  Social History  Substance Use Topics  . Smoking status: Never Smoker  . Smokeless tobacco: Never Used  . Alcohol use No    No current facility-administered medications for this encounter.   Current Outpatient Prescriptions:  .  amLODipine (NORVASC) 5 MG tablet, Take 5 mg by mouth daily. AM, Disp: , Rfl:  .  aspirin 81 MG tablet, Take 81 mg by mouth daily. AM, Disp: , Rfl:  .  carvedilol (COREG) 3.125 MG tablet, Take 6.25 mg by mouth daily., Disp: , Rfl:  .  magnesium oxide (MAG-OX) 400 MG tablet, Take 400 mg by mouth daily. AM, Disp: , Rfl:  .  metFORMIN (GLUCOPHAGE) 1000 MG tablet, Take 1,000 mg by mouth daily. AM, Disp: , Rfl:  .  omeprazole (PRILOSEC) 40 MG  capsule, Take 40 mg by mouth daily., Disp: , Rfl:  .  acetaminophen (TYLENOL) 500 MG tablet, Take 500 mg by mouth every 6 (six) hours as needed., Disp: , Rfl:  .  ciprofloxacin (CIPRO) 500 MG tablet, Take 1 tablet (500 mg total) by mouth 2 (two) times daily., Disp: 14 tablet, Rfl: 0 .  metroNIDAZOLE (FLAGYL) 500 MG tablet, Take 1 tablet (500 mg total) by mouth 3 (three) times daily., Disp: 21 tablet, Rfl: 0 .  ondansetron (ZOFRAN ODT) 4 MG disintegrating tablet, Take 1 tablet (4 mg total) by mouth every 8 (eight) hours as needed for  nausea or vomiting., Disp: 20 tablet, Rfl: 0 .  oxyCODONE-acetaminophen (PERCOCET/ROXICET) 5-325 MG tablet, Take 0.5 tablets by mouth every 6 (six) hours as needed for severe pain., Disp: 5 tablet, Rfl: 0  Allergies  Allergen Reactions  . Tramadol Nausea And Vomiting  . Motrin [Ibuprofen] Rash     ROS  As noted in HPI.   Physical Exam  BP 124/82 (BP Location: Left Arm)   Pulse 81   Temp 97.9 F (36.6 C) (Oral)   Resp 18   Ht 5\' 4"  (1.626 m)   Wt 165 lb (74.8 kg)   SpO2 97%   BMI 28.32 kg/m   Constitutional: Well developed, well nourished, no acute distress Eyes:  EOMI, conjunctiva normal bilaterally HENT: Normocephalic, atraumatic,mucus membranes moist Respiratory: Normal inspiratory effort Cardiovascular: Normal rate GI: nondistended. Soft, mild tenderness in left lower quadrant. No guarding, rebound. Normal bowel sounds. negative McBurney. No suprapubic, periumbilical, flank tenderness, right upper quadrant or left upper quadrant tenderness.  Back: No CVA tenderness. skin: No rash, skin intact Musculoskeletal: no deformities Neurologic: Alert & oriented x 3, no focal neuro deficits Psychiatric: Speech and behavior appropriate   ED Course   Medications - No data to display  Orders Placed This Encounter  Procedures  . Urinalysis complete, with microscopic    Standing Status:   Standing    Number of Occurrences:   1  . CBC with Differential    Standing Status:   Standing    Number of Occurrences:   1  . Comprehensive metabolic panel    Standing Status:   Standing    Number of Occurrences:   1    Results for orders placed or performed during the hospital encounter of 12/10/15 (from the past 24 hour(s))  Urinalysis complete, with microscopic     Status: Abnormal   Collection Time: 12/10/15 10:33 AM  Result Value Ref Range   Color, Urine YELLOW YELLOW   APPearance CLEAR CLEAR   Glucose, UA NEGATIVE NEGATIVE mg/dL   Bilirubin Urine NEGATIVE NEGATIVE    Ketones, ur NEGATIVE NEGATIVE mg/dL   Specific Gravity, Urine 1.010 1.005 - 1.030   Hgb urine dipstick NEGATIVE NEGATIVE   pH 5.0 5.0 - 8.0   Protein, ur NEGATIVE NEGATIVE mg/dL   Nitrite NEGATIVE NEGATIVE   Leukocytes, UA NEGATIVE NEGATIVE   RBC / HPF 0-5 0 - 5 RBC/hpf   WBC, UA 0-5 0 - 5 WBC/hpf   Bacteria, UA NONE SEEN NONE SEEN   Squamous Epithelial / LPF 0-5 (A) NONE SEEN  CBC with Differential     Status: Abnormal   Collection Time: 12/10/15 11:35 AM  Result Value Ref Range   WBC 8.6 3.6 - 11.0 K/uL   RBC 5.47 (H) 3.80 - 5.20 MIL/uL   Hemoglobin 15.7 12.0 - 16.0 g/dL   HCT 47.2 (H) 35.0 - 47.0 %   MCV  86.2 80.0 - 100.0 fL   MCH 28.7 26.0 - 34.0 pg   MCHC 33.3 32.0 - 36.0 g/dL   RDW 14.5 11.5 - 14.5 %   Platelets 244 150 - 440 K/uL   Neutrophils Relative % 62 %   Neutro Abs 5.4 1.4 - 6.5 K/uL   Lymphocytes Relative 28 %   Lymphs Abs 2.4 1.0 - 3.6 K/uL   Monocytes Relative 7 %   Monocytes Absolute 0.6 0.2 - 0.9 K/uL   Eosinophils Relative 1 %   Eosinophils Absolute 0.1 0 - 0.7 K/uL   Basophils Relative 2 %   Basophils Absolute 0.1 0 - 0.1 K/uL  Comprehensive metabolic panel     Status: Abnormal   Collection Time: 12/10/15 11:35 AM  Result Value Ref Range   Sodium 137 135 - 145 mmol/L   Potassium 4.4 3.5 - 5.1 mmol/L   Chloride 100 (L) 101 - 111 mmol/L   CO2 27 22 - 32 mmol/L   Glucose, Bld 131 (H) 65 - 99 mg/dL   BUN 12 6 - 20 mg/dL   Creatinine, Ser 0.76 0.44 - 1.00 mg/dL   Calcium 9.9 8.9 - 10.3 mg/dL   Total Protein 7.9 6.5 - 8.1 g/dL   Albumin 4.3 3.5 - 5.0 g/dL   AST 72 (H) 15 - 41 U/L   ALT 59 (H) 14 - 54 U/L   Alkaline Phosphatase 83 38 - 126 U/L   Total Bilirubin 0.6 0.3 - 1.2 mg/dL   GFR calc non Af Amer >60 >60 mL/min   GFR calc Af Amer >60 >60 mL/min   Anion gap 10 5 - 15   No results found.  ED Clinical Impression  Diverticulitis of intestine without perforation or abscess without bleeding  Elevated liver enzymes   ED  Assessment/Plan  [facility] narcotic database reviewed. Patient's prescribed 20 Percocet on 8/23, 20 Percocet on 7/27. There are no other narcotic prescriptions in the past 6 months. Previous records reviewed. As noted in history of present illness.  Vitals normal. Pt afebrile. Has not taken antipyretic in past 6-8 hours. She appears nontoxic. She has no guarding or rebound or other peritoneal signs concerning for perforation at this time.  Urine normal.  We'll check CMP, CBC  No leukocytosis, kidney function normal. UA negative for UTI. CBC and CMP remarkable only for Elevated AST and ALT, however, it has been elevated in the past and she does have known cirrhosis. This may be medication induced. Will have her follow-up with her primary care physician regarding this.  feel the patient is safe to attempt outpatient treatment with another round of antibiotics, will do Cipro 500 mg twice a day for 7 days and Flagyl 500 mg 3 times a day for 7 days. We will send home with Percocet 5/325 one half tablet to take as needed for severe pain, but instructed patient that if she needs take a whole tablet of Percocet, that she needs to go immediately to the ED. Gave patient very strict return precautions multiple times.  Discussed labs, imaging, MDM, plan and followup with patient. Discussed sn/sx that should prompt return to the ED. Patient agrees with plan.   *This clinic note was created using Dragon dictation software. Therefore, there may be occasional mistakes despite careful proofreading.  ?   Melynda Ripple, MD 12/10/15 [phone]

## 2015-12-10 NOTE — ED Triage Notes (Signed)
Patient presents today with pain in her abdomen. She went to she a Gastroenterology at [facility] and that MD sent her to [facility] on 11/30/15 and Tamala Ser prescribed her antibiotics. The patient took the antibiotic however she was having severe pain, so she went to the ER the night at 11/30/2015 and they gave her some Norco to take. She finished all the medicine on Tuesday and she is still in pain, she has an appt with Dr Raechel Ache on 9/16, and she has a colonoscopy in October scheduled. The pain is mainly in her lower abdomen at this time.

## 2015-12-16 DIAGNOSIS — K7460 Unspecified cirrhosis of liver: Secondary | ICD-10-CM | POA: Insufficient documentation

## 2015-12-16 DIAGNOSIS — K760 Fatty (change of) liver, not elsewhere classified: Secondary | ICD-10-CM | POA: Insufficient documentation

## 2015-12-22 ENCOUNTER — Emergency Department
Admission: EM | Admit: 2015-12-22 | Discharge: 2015-12-22 | Disposition: A | Discharge status: Home / Self Care | Payer: Medicare Other | Attending: Emergency Medicine | Admitting: Emergency Medicine

## 2015-12-22 ENCOUNTER — Emergency Department: Payer: Medicare Other

## 2015-12-22 DIAGNOSIS — K2100 Gastro-esophageal reflux disease with esophagitis, without bleeding: Secondary | ICD-10-CM

## 2015-12-22 DIAGNOSIS — Z7982 Long term (current) use of aspirin: Secondary | ICD-10-CM | POA: Diagnosis not present

## 2015-12-22 DIAGNOSIS — E119 Type 2 diabetes mellitus without complications: Secondary | ICD-10-CM | POA: Insufficient documentation

## 2015-12-22 DIAGNOSIS — Z791 Long term (current) use of non-steroidal anti-inflammatories (NSAID): Secondary | ICD-10-CM | POA: Diagnosis not present

## 2015-12-22 DIAGNOSIS — R112 Nausea with vomiting, unspecified: Secondary | ICD-10-CM | POA: Diagnosis present

## 2015-12-22 DIAGNOSIS — Z79899 Other long term (current) drug therapy: Secondary | ICD-10-CM | POA: Diagnosis not present

## 2015-12-22 DIAGNOSIS — Z7984 Long term (current) use of oral hypoglycemic drugs: Secondary | ICD-10-CM | POA: Diagnosis not present

## 2015-12-22 DIAGNOSIS — I10 Essential (primary) hypertension: Secondary | ICD-10-CM | POA: Insufficient documentation

## 2015-12-22 DIAGNOSIS — R079 Chest pain, unspecified: Secondary | ICD-10-CM

## 2015-12-22 LAB — COMPREHENSIVE METABOLIC PANEL
ALBUMIN: 4.1 g/dL (ref 3.5–5.0)
ALT: 32 U/L (ref 14–54)
AST: 36 U/L (ref 15–41)
Alkaline Phosphatase: 75 U/L (ref 38–126)
Anion gap: 11 (ref 5–15)
BILIRUBIN TOTAL: 0.2 mg/dL — AB (ref 0.3–1.2)
BUN: 8 mg/dL (ref 6–20)
CO2: 25 mmol/L (ref 22–32)
CREATININE: 0.77 mg/dL (ref 0.44–1.00)
Calcium: 9.8 mg/dL (ref 8.9–10.3)
Chloride: 100 mmol/L — ABNORMAL LOW (ref 101–111)
GFR calc Af Amer: >60 mL/min (ref >60)
GLUCOSE: 137 mg/dL — AB (ref 65–99)
Potassium: 3.7 mmol/L (ref 3.5–5.1)
Sodium: 136 mmol/L (ref 135–145)
TOTAL PROTEIN: 7.6 g/dL (ref 6.5–8.1)

## 2015-12-22 LAB — TROPONIN I
Troponin I: <0.03 ng/mL (ref <0.03)
Troponin I: <0.03 ng/mL (ref <0.03)

## 2015-12-22 LAB — CBC WITH DIFFERENTIAL/PLATELET
BASOS ABS: 0.1 10*3/uL (ref 0–0.1)
Basophils Relative: 1 %
Eosinophils Absolute: 0.2 10*3/uL (ref 0–0.7)
Eosinophils Relative: 2 %
HEMATOCRIT: 44.4 % (ref 35.0–47.0)
HEMOGLOBIN: 15.3 g/dL (ref 12.0–16.0)
LYMPHS PCT: 26 %
Lymphs Abs: 2.5 10*3/uL (ref 1.0–3.6)
MCH: 29.1 pg (ref 26.0–34.0)
MCHC: 34.6 g/dL (ref 32.0–36.0)
MCV: 84.1 fL (ref 80.0–100.0)
Monocytes Absolute: 0.7 10*3/uL (ref 0.2–0.9)
Monocytes Relative: 7 %
NEUTROS ABS: 6.3 10*3/uL (ref 1.4–6.5)
NEUTROS PCT: 64 %
Platelets: 282 10*3/uL (ref 150–440)
RBC: 5.28 MIL/uL — AB (ref 3.80–5.20)
RDW: 14.2 % (ref 11.5–14.5)
WBC: 9.8 10*3/uL (ref 3.6–11.0)

## 2015-12-22 MED ORDER — OXYCODONE-ACETAMINOPHEN 5-325 MG PO TABS: 1.0000 | ORAL_TABLET | Freq: Four times a day (QID) | ORAL | 0 refills | Status: DC | PRN

## 2015-12-22 MED ORDER — OXYCODONE-ACETAMINOPHEN 5-325 MG PO TABS
1.0000 | ORAL_TABLET | Freq: Once | ORAL | Status: AC
  Administered 2015-12-22: 1 via ORAL

## 2015-12-22 MED ORDER — DIPHENHYDRAMINE HCL 50 MG/ML IJ SOLN
25.0000 mg | Freq: Once | INTRAMUSCULAR | Status: AC
  Administered 2015-12-22: 25 mg via INTRAVENOUS
  Filled 2015-12-22: qty 1

## 2015-12-22 MED ORDER — DIPHENHYDRAMINE HCL 50 MG/ML IJ SOLN: 25.0000 mg | Freq: Once | INTRAMUSCULAR | Status: DC

## 2015-12-22 MED ORDER — ONDANSETRON HCL 4 MG PO TABS: 4.0000 mg | ORAL_TABLET | Freq: Every day | ORAL | 1 refills | Status: DC | PRN

## 2015-12-22 MED ORDER — SUCRALFATE 1 G PO TABS: 1.0000 g | ORAL_TABLET | Freq: Four times a day (QID) | ORAL | 1 refills | Status: DC

## 2015-12-22 MED ORDER — KETOROLAC TROMETHAMINE 30 MG/ML IJ SOLN
15.0000 mg | Freq: Once | INTRAMUSCULAR | Status: AC
  Administered 2015-12-22: 15 mg via INTRAVENOUS
  Filled 2015-12-22: qty 1

## 2015-12-22 MED ORDER — KETOROLAC TROMETHAMINE 30 MG/ML IJ SOLN: 15.0000 mg | Freq: Once | INTRAMUSCULAR | Status: DC

## 2015-12-22 NOTE — ED Provider Notes (Signed)
This is an addendum to my previously filled out down-time performed for Epic. Patient is in no distress, presented after having GERD related symptoms and vomiting. Symptoms are currently improved, I will add Carafate to the Prilosec that she is taking. I will also prescribe more Zofran. She is stable for outpatient follow-up with her doctor.   Earleen Newport, MD 12/22/15 [phone]

## 2016-02-07 ENCOUNTER — Encounter: Payer: Self-pay | Admitting: *Deleted

## 2016-02-08 ENCOUNTER — Encounter
Admission: RE | Disposition: A | Discharge status: Home / Self Care | Payer: Self-pay | Source: Ambulatory Visit | Attending: Gastroenterology

## 2016-02-08 ENCOUNTER — Ambulatory Visit: Payer: Medicare Other | Admitting: Anesthesiology

## 2016-02-08 ENCOUNTER — Encounter: Payer: Self-pay | Admitting: *Deleted

## 2016-02-08 ENCOUNTER — Ambulatory Visit
Admission: RE | Admit: 2016-02-08 | Discharge: 2016-02-08 | Disposition: A | Discharge status: Home / Self Care | Payer: Medicare Other | Source: Ambulatory Visit | Attending: Gastroenterology | Admitting: Gastroenterology

## 2016-02-08 DIAGNOSIS — K219 Gastro-esophageal reflux disease without esophagitis: Secondary | ICD-10-CM | POA: Diagnosis not present

## 2016-02-08 DIAGNOSIS — Z7982 Long term (current) use of aspirin: Secondary | ICD-10-CM | POA: Diagnosis not present

## 2016-02-08 DIAGNOSIS — Z7984 Long term (current) use of oral hypoglycemic drugs: Secondary | ICD-10-CM | POA: Diagnosis not present

## 2016-02-08 DIAGNOSIS — D123 Benign neoplasm of transverse colon: Secondary | ICD-10-CM | POA: Diagnosis not present

## 2016-02-08 DIAGNOSIS — K5730 Diverticulosis of large intestine without perforation or abscess without bleeding: Secondary | ICD-10-CM | POA: Diagnosis not present

## 2016-02-08 DIAGNOSIS — E119 Type 2 diabetes mellitus without complications: Secondary | ICD-10-CM | POA: Diagnosis not present

## 2016-02-08 DIAGNOSIS — I10 Essential (primary) hypertension: Secondary | ICD-10-CM | POA: Insufficient documentation

## 2016-02-08 DIAGNOSIS — E7800 Pure hypercholesterolemia, unspecified: Secondary | ICD-10-CM | POA: Insufficient documentation

## 2016-02-08 DIAGNOSIS — M1990 Unspecified osteoarthritis, unspecified site: Secondary | ICD-10-CM | POA: Diagnosis not present

## 2016-02-08 DIAGNOSIS — K640 First degree hemorrhoids: Secondary | ICD-10-CM | POA: Insufficient documentation

## 2016-02-08 DIAGNOSIS — Z79899 Other long term (current) drug therapy: Secondary | ICD-10-CM | POA: Insufficient documentation

## 2016-02-08 HISTORY — PX: COLONOSCOPY: SHX5424

## 2016-02-08 LAB — CBC
HCT: 48.7 % — ABNORMAL HIGH (ref 35.0–47.0)
Hemoglobin: 16.1 g/dL — ABNORMAL HIGH (ref 12.0–16.0)
MCH: 28.5 pg (ref 26.0–34.0)
MCHC: 33 g/dL (ref 32.0–36.0)
MCV: 86.4 fL (ref 80.0–100.0)
PLATELETS: 249 10*3/uL (ref 150–440)
RBC: 5.64 MIL/uL — ABNORMAL HIGH (ref 3.80–5.20)
RDW: 14.7 % — AB (ref 11.5–14.5)
WBC: 8.4 10*3/uL (ref 3.6–11.0)

## 2016-02-08 LAB — GLUCOSE, CAPILLARY: GLUCOSE-CAPILLARY: 144 mg/dL — AB (ref 65–99)

## 2016-02-08 LAB — PROTIME-INR
INR: 1.05
PROTHROMBIN TIME: 13.7 s (ref 11.4–15.2)

## 2016-02-08 SURGERY — COLONOSCOPY
Anesthesia: General

## 2016-02-08 MED ORDER — SODIUM CHLORIDE 0.9 % IV SOLN
INTRAVENOUS | Status: DC
  Administered 2016-02-08: 09:00:00 via INTRAVENOUS

## 2016-02-08 MED ORDER — MIDAZOLAM HCL 2 MG/2ML IJ SOLN
INTRAMUSCULAR | Status: DC | PRN
  Administered 2016-02-08: 1 mg via INTRAVENOUS

## 2016-02-08 MED ORDER — FENTANYL CITRATE (PF) 100 MCG/2ML IJ SOLN
INTRAMUSCULAR | Status: DC | PRN
  Administered 2016-02-08: 50 ug via INTRAVENOUS

## 2016-02-08 MED ORDER — SODIUM CHLORIDE 0.9 % IV SOLN: INTRAVENOUS | Status: DC

## 2016-02-08 MED ORDER — PROPOFOL 500 MG/50ML IV EMUL
INTRAVENOUS | Status: DC | PRN
  Administered 2016-02-08: 120 ug/kg/min via INTRAVENOUS

## 2016-02-08 NOTE — Op Note (Signed)
[facility] Gastroenterology Patient Name: Alison Warren Procedure Date: 02/08/2016 9:44 AM MRN: [id number] Account #: [id number] Date of Birth: 1945/12/05 Admit Type: Outpatient Age: 70 Room: [facility] ENDO ROOM 3 Gender: Female Note Status: Finalized Procedure:            Colonoscopy Indications:          Follow-up of diverticulitis Providers:            Lollie Sails, MD Referring MD:         Christena Flake. Raechel Ache, MD (Referring MD) Medicines:            Monitored Anesthesia Care Complications:        No immediate complications. Procedure:            Pre-Anesthesia Assessment:                       - ASA Grade Assessment: III - A patient with severe                        systemic disease.                       After obtaining informed consent, the colonoscope was                        passed under direct vision. Throughout the procedure,                        the patient's blood pressure, pulse, and oxygen                        saturations were monitored continuously. The                        Colonoscope was introduced through the anus and                        advanced to the the cecum, identified by appendiceal                        orifice and ileocecal valve. The colonoscopy was                        performed without difficulty. The patient tolerated the                        procedure well. The quality of the bowel preparation                        was good except the ascending colon was fair. Findings:      A 3 mm polyp was found in the proximal transverse colon. The polyp was       flat. The polyp was removed with a cold biopsy forceps. Resection and       retrieval were complete.      Multiple small and large-mouthed diverticula were found in the sigmoid       colon and descending colon.      Non-bleeding internal hemorrhoids were found during retroflexion and       during anoscopy. The hemorrhoids were small and Grade I (internal   hemorrhoids that do not  prolapse).      No additional abnormalities were found on retroflexion.      The digital rectal exam was normal. Impression:           - One 3 mm polyp in the proximal transverse colon,                        removed with a cold biopsy forceps. Resected and                        retrieved.                       - Diverticulosis in the sigmoid colon and in the                        descending colon.                       - Non-bleeding internal hemorrhoids. Recommendation:       - Discharge patient to home.                       - Await pathology results.                       - Telephone GI clinic for pathology results in 1 week.                       - Use Citrucel one tablespoon PO daily daily. Procedure Code(s):    --- Professional ---                       [phone], Colonoscopy, flexible; with biopsy, single or                        multiple Diagnosis Code(s):    --- Professional ---                       D12.3, Benign neoplasm of transverse colon (hepatic                        flexure or splenic flexure)                       K64.0, First degree hemorrhoids                       K57.32, Diverticulitis of large intestine without                        perforation or abscess without bleeding                       K57.30, Diverticulosis of large intestine without                        perforation or abscess without bleeding CPT copyright 2016 American Medical Association. All rights reserved. The codes documented in this report are preliminary and upon coder review may  be revised to meet current compliance requirements. Lollie Sails, MD 02/08/2016 10:20:08 AM This report has been signed electronically. Number of Addenda: 0 Note Initiated On: 02/08/2016 9:44 AM Scope Withdrawal  Time: 0 hours 9 minutes 15 seconds  Total Procedure Duration: 0 hours 24 minutes 17 seconds       [facility]

## 2016-02-08 NOTE — H&P (Signed)
Outpatient short stay form Pre-procedure 02/08/2016 9:33 AM Alison Sails MD  Primary Physician: Dr. Genene Churn  Reason for visit:  Colonoscopy  History of present illness:  Alison Warren is a 70 year old female presenting today as above. He has had a problem with recurrent diverticulitis over the past couple of months. She last finished a course of antibiotics about 3-4 weeks ago. She tolerated her prep well. She takes no blood thinners with the exception of 81 mg aspirin. There was a ProTime and CBC done this morning with the INR being 1.05 platelet count 249.  She does have a recent history of diagnosis of cirrhosis of the liver etiology possibly nonalcoholic  fatty liver disease.    Current Facility-Administered Medications:  .  0.9 %  sodium chloride infusion, , Intravenous, Continuous, Alison Sails, MD, Last Rate: 20 mL/hr at 02/08/16 0839 .  0.9 %  sodium chloride infusion, , Intravenous, Continuous, Alison Sails, MD  Prescriptions Prior to Admission  Medication Sig Dispense Refill Last Dose  . amLODipine (NORVASC) 5 MG tablet Take 5 mg by mouth daily. AM   02/08/2016 at 0400  . aspirin 81 MG tablet Take 81 mg by mouth daily. AM   02/07/2016 at Unknown time  . carvedilol (COREG) 3.125 MG tablet Take 6.25 mg by mouth daily.   02/08/2016 at 0400  . magnesium oxide (MAG-OX) 400 MG tablet Take 400 mg by mouth daily. AM   Past Week at Unknown time  . metFORMIN (GLUCOPHAGE-XR) 500 MG 24 hr tablet Take 1,500 mg by mouth daily.   02/07/2016 at Unknown time  . omeprazole (PRILOSEC) 40 MG capsule Take 40 mg by mouth daily.   02/07/2016 at Unknown time  . acetaminophen (TYLENOL) 500 MG tablet Take 500 mg by mouth every 6 (six) hours as needed for moderate pain, fever or headache.    Unknown at Unknown time  . ciprofloxacin (CIPRO) 500 MG tablet Take 1 tablet (500 mg total) by mouth 2 (two) times daily. (Alison Warren not taking: Reported on 12/22/2015) 14 tablet 0 Completed Course at Unknown  time  . metroNIDAZOLE (FLAGYL) 500 MG tablet Take 1 tablet (500 mg total) by mouth 3 (three) times daily. (Alison Warren not taking: Reported on 12/22/2015) 21 tablet 0 Completed Course at Unknown time  . ondansetron (ZOFRAN ODT) 4 MG disintegrating tablet Take 1 tablet (4 mg total) by mouth every 8 (eight) hours as needed for nausea or vomiting. 20 tablet 0 Unknown at Unknown time  . ondansetron (ZOFRAN) 4 MG tablet Take 1 tablet (4 mg total) by mouth daily as needed for nausea or vomiting. 20 tablet 1   . oxyCODONE-acetaminophen (PERCOCET) 5-325 MG tablet Take 1-2 tablets by mouth every 6 (six) hours as needed for moderate pain or severe pain. 20 tablet 0   . oxyCODONE-acetaminophen (PERCOCET/ROXICET) 5-325 MG tablet Take 0.5 tablets by mouth every 6 (six) hours as needed for severe pain. 5 tablet 0   . sucralfate (CARAFATE) 1 g tablet Take 1 tablet (1 g total) by mouth 4 (four) times daily. (Alison Warren not taking: Reported on 02/08/2016) 120 tablet 1 Not Taking at Unknown time     Allergies  Allergen Reactions  . Tramadol Nausea And Vomiting  . Motrin [Ibuprofen] Rash     Past Medical History:  Diagnosis Date  . Arthritis   . Bronchitis    none currently  . Chronic cough   . Costochondritis   . Diabetes mellitus without complication ([place])   . Diverticulitis   .  GERD (gastroesophageal reflux disease)   . Hypercholesteremia   . Hypertension   . Wears hearing aid    right ear    Review of systems:      Physical Exam    Heart and lungs: Regular rate and rhythm without rub or gallop, lungs are bilaterally clear.   HEENT: Cephalic atraumatic eyes are anicteric   Other:     Pertinant exam for procedure: Soft positive mild tenderness to palpation in the left lower quadrant. There is a bit of a fullness in this region. It is no rebound.   Planned proceedures: Colonoscopy and indicated procedures. I have discussed the risks benefits and complications of procedures to include not limited  to bleeding, infection, perforation and the risk of sedation and the Alison Warren wishes to proceed.   Alison Sails, MD Gastroenterology 02/08/2016  9:33 AM

## 2016-02-08 NOTE — Transfer of Care (Signed)
Immediate Anesthesia Transfer of Care Note  Patient: OUITA IMANI  Procedure(s) Performed: Procedure(s) with comments: COLONOSCOPY (N/A) - Diabetes  Patient Location: PACU  Anesthesia Type:General  Level of Consciousness: awake and sedated  Airway & Oxygen Therapy: Patient Spontanous Breathing and Patient connected to nasal cannula oxygen  Post-op Assessment: Report given to RN and Post -op Vital signs reviewed and stable  Post vital signs: Reviewed and stable  Last Vitals:  Vitals:   02/08/16 0753  BP: (!) 142/86  Pulse: 67  Resp: 18  Temp: (!) 35.3 C    Last Pain:  Vitals:   02/08/16 0753  TempSrc: Tympanic         Complications: No apparent anesthesia complications

## 2016-02-08 NOTE — Anesthesia Postprocedure Evaluation (Signed)
Anesthesia Post Note  Patient: Alison Warren  Procedure(s) Performed: Procedure(s) (LRB): COLONOSCOPY (N/A)  Patient location during evaluation: Endoscopy Anesthesia Type: General Level of consciousness: awake and alert and oriented Pain management: pain level controlled Vital Signs Assessment: post-procedure vital signs reviewed and stable Respiratory status: spontaneous breathing, nonlabored ventilation and respiratory function stable Cardiovascular status: blood pressure returned to baseline and stable Postop Assessment: no signs of nausea or vomiting Anesthetic complications: no    Last Vitals:  Vitals:   02/08/16 1032 02/08/16 1042  BP: 115/73 118/81  Pulse: 69 65  Resp: 15 16  Temp:      Last Pain:  Vitals:   02/08/16 1022  TempSrc: Axillary                 Luby Seamans

## 2016-02-08 NOTE — Anesthesia Preprocedure Evaluation (Signed)
Anesthesia Evaluation  Patient identified by MRN, date of birth, ID band Patient awake    Reviewed: Allergy & Precautions, NPO status , Patient's Chart, lab work & pertinent test results, reviewed documented beta blocker date and time   History of Anesthesia Complications Negative for: history of anesthetic complications  Airway Mallampati: II  TM Distance: >3 FB Neck ROM: Full    Dental   Pulmonary neg pulmonary ROS, neg sleep apnea, neg COPD,    breath sounds clear to auscultation- rhonchi (-) wheezing  (-) rales    Cardiovascular hypertension, Pt. on medications and Pt. on home beta blockers (-) CAD and (-) Past MI  Rhythm:Regular Rate:Normal - Systolic murmurs and - Diastolic murmurs    Neuro/Psych negative neurological ROS  negative psych ROS   GI/Hepatic Neg liver ROS, GERD  ,  Endo/Other  diabetes, Type 2, Oral Hypoglycemic Agents  Renal/GU negative Renal ROS     Musculoskeletal  (+) Arthritis ,   Abdominal (+) - obese,   Peds  Hematology negative hematology ROS (+)   Anesthesia Other Findings Past Medical History: No date: Arthritis No date: Bronchitis     Comment: none currently No date: Chronic cough No date: Costochondritis No date: Diabetes mellitus without complication ([facility]) No date: Diverticulitis No date: GERD (gastroesophageal reflux disease) No date: Hypercholesteremia No date: Hypertension No date: Wears hearing aid     Comment: right ear   Reproductive/Obstetrics                             Anesthesia Physical Anesthesia Plan  ASA: II  Anesthesia Plan: General   Post-op Pain Management:    Induction: Intravenous  Airway Management Planned: Natural Airway  Additional Equipment:   Intra-op Plan:   Post-operative Plan:   Informed Consent: I have reviewed the patients History and Physical, chart, labs and discussed the procedure including the risks,  benefits and alternatives for the proposed anesthesia with the patient or authorized representative who has indicated his/her understanding and acceptance.   Dental advisory given  Plan Discussed with: CRNA and Anesthesiologist  Anesthesia Plan Comments:         Anesthesia Quick Evaluation

## 2016-02-08 NOTE — Anesthesia Procedure Notes (Signed)
Performed by: COOK-MARTIN, Sherica Paternostro Pre-anesthesia Checklist: Patient identified, Emergency Drugs available, Suction available, Patient being monitored and Timeout performed Patient Re-evaluated:Patient Re-evaluated prior to inductionOxygen Delivery Method: Nasal cannula Preoxygenation: Pre-oxygenation with 100% oxygen Intubation Type: IV induction Placement Confirmation: CO2 detector and positive ETCO2       

## 2016-02-09 ENCOUNTER — Encounter: Payer: Self-pay | Admitting: Gastroenterology

## 2016-02-09 LAB — SURGICAL PATHOLOGY

## 2016-02-24 DIAGNOSIS — Z86010 Personal history of colonic polyps: Secondary | ICD-10-CM | POA: Insufficient documentation

## 2016-06-03 ENCOUNTER — Emergency Department
Admission: EM | Admit: 2016-06-03 | Discharge: 2016-06-04 | Disposition: A | Discharge status: Home / Self Care | Payer: Medicare Other | Attending: Emergency Medicine | Admitting: Emergency Medicine

## 2016-06-03 ENCOUNTER — Encounter: Payer: Self-pay | Admitting: Emergency Medicine

## 2016-06-03 DIAGNOSIS — Z79899 Other long term (current) drug therapy: Secondary | ICD-10-CM | POA: Diagnosis not present

## 2016-06-03 DIAGNOSIS — I10 Essential (primary) hypertension: Secondary | ICD-10-CM | POA: Diagnosis not present

## 2016-06-03 DIAGNOSIS — Z7982 Long term (current) use of aspirin: Secondary | ICD-10-CM | POA: Diagnosis not present

## 2016-06-03 DIAGNOSIS — Z7984 Long term (current) use of oral hypoglycemic drugs: Secondary | ICD-10-CM | POA: Diagnosis not present

## 2016-06-03 DIAGNOSIS — E119 Type 2 diabetes mellitus without complications: Secondary | ICD-10-CM | POA: Insufficient documentation

## 2016-06-03 DIAGNOSIS — M542 Cervicalgia: Secondary | ICD-10-CM | POA: Diagnosis present

## 2016-06-03 DIAGNOSIS — M436 Torticollis: Secondary | ICD-10-CM | POA: Insufficient documentation

## 2016-06-03 LAB — CBC
HCT: 44 % (ref 35.0–47.0)
HEMOGLOBIN: 15.5 g/dL (ref 12.0–16.0)
MCH: 29.6 pg (ref 26.0–34.0)
MCHC: 35.1 g/dL (ref 32.0–36.0)
MCV: 84.4 fL (ref 80.0–100.0)
PLATELETS: 293 10*3/uL (ref 150–440)
RBC: 5.22 MIL/uL — ABNORMAL HIGH (ref 3.80–5.20)
RDW: 14.5 % (ref 11.5–14.5)
WBC: 10.9 10*3/uL (ref 3.6–11.0)

## 2016-06-03 MED ORDER — LIDOCAINE 5 % EX PTCH
1.0000 | MEDICATED_PATCH | CUTANEOUS | Status: DC
  Administered 2016-06-04: 1 via TRANSDERMAL
  Filled 2016-06-03: qty 1

## 2016-06-03 MED ORDER — DIAZEPAM 2 MG PO TABS
2.0000 mg | ORAL_TABLET | Freq: Once | ORAL | Status: AC
  Administered 2016-06-04: 2 mg via ORAL
  Filled 2016-06-03: qty 1

## 2016-06-03 MED ORDER — OXYCODONE-ACETAMINOPHEN 5-325 MG PO TABS
1.0000 | ORAL_TABLET | Freq: Once | ORAL | Status: AC
  Administered 2016-06-04: 1 via ORAL
  Filled 2016-06-03: qty 1

## 2016-06-03 NOTE — ED Triage Notes (Signed)
Pt. States neck pain that started this morning.  Pt. States pain radiates into rt. Shoulder.  Pt. States cough for the past week.

## 2016-06-04 ENCOUNTER — Emergency Department: Payer: Medicare Other

## 2016-06-04 LAB — BASIC METABOLIC PANEL
Anion gap: 9 (ref 5–15)
BUN: 15 mg/dL (ref 6–20)
CHLORIDE: 102 mmol/L (ref 101–111)
CO2: 22 mmol/L (ref 22–32)
CREATININE: 0.87 mg/dL (ref 0.44–1.00)
Calcium: 9.6 mg/dL (ref 8.9–10.3)
GFR calc non Af Amer: >60 mL/min (ref >60)
Glucose, Bld: 166 mg/dL — ABNORMAL HIGH (ref 65–99)
Potassium: 4.5 mmol/L (ref 3.5–5.1)
SODIUM: 133 mmol/L — AB (ref 135–145)

## 2016-06-04 LAB — TROPONIN I

## 2016-06-04 MED ORDER — OXYCODONE-ACETAMINOPHEN 5-325 MG PO TABS: 1.0000 | ORAL_TABLET | Freq: Four times a day (QID) | ORAL | 0 refills | Status: DC | PRN

## 2016-06-04 MED ORDER — LIDOCAINE 5 % EX PTCH: 1.0000 | MEDICATED_PATCH | Freq: Two times a day (BID) | CUTANEOUS | 0 refills | Status: DC

## 2016-06-04 MED ORDER — DIAZEPAM 2 MG PO TABS: 2.0000 mg | ORAL_TABLET | Freq: Four times a day (QID) | ORAL | 0 refills | Status: DC | PRN

## 2016-06-04 NOTE — ED Provider Notes (Signed)
[facility] Emergency Department Provider Note   ____________________________________________   First MD Initiated Contact with Patient 06/03/16 2344     (approximate)  I have reviewed the triage vital signs and the nursing notes.   HISTORY  Chief Complaint Neck Pain (Pt. here via EMS for rt. sided neck pain.)    HPI Alison Warren is a 71 y.o. female who comes into the hospital today with some right neck and shoulder pain. The patient also has a cough. She went to the doctor on Thursday for a cough and was given some benzonatate and doxycycline. She reports that she developed some right-sided neck pain going into her arm and shoulder earlier today. She reports that she thought maybe she was having a stroke because the pain was so bad. The patient's blood pressure systolic was also in the [id number]. She had taken her prescribed medicine earlier without any difficulty. She reports that her pain as a 9 out of 10 in intensity currently. She has never had pain like this before. The patient denies chest pain except for when she coughs and she reports that her cough is productive of yellow mucus. The patient denies any shortness of breath but has had some mild sweating. She is here today for evaluation.   Past Medical History:  Diagnosis Date  . Arthritis   . Bronchitis    none currently  . Chronic cough   . Costochondritis   . Diabetes mellitus without complication ([place])   . Diverticulitis   . GERD (gastroesophageal reflux disease)   . Hypercholesteremia   . Hypertension   . Wears hearing aid    right ear    There are no active problems to display for this patient.   Past Surgical History:  Procedure Laterality Date  . CATARACT EXTRACTION W/ INTRAOCULAR LENS IMPLANT    . CATARACT EXTRACTION W/PHACO Right 12/07/2014   Procedure: CATARACT EXTRACTION PHACO AND INTRAOCULAR LENS PLACEMENT ([place]);  Surgeon: Ronnell Freshwater, MD;  Location: [place];  Service: Ophthalmology;  Laterality: Right;  DIABETIC - oral meds PER DR VIN START TIME 8AM  . CHOLECYSTECTOMY    . COLONOSCOPY    . COLONOSCOPY N/A 02/08/2016   Procedure: COLONOSCOPY;  Surgeon: Lollie Sails, MD;  Location: [facility] ENDOSCOPY;  Service: Endoscopy;  Laterality: N/A;  Diabetes  . MASTOIDECTOMY    . THUMB AMPUTATION     removal of 2nd thumb  . TONSILLECTOMY      Prior to Admission medications   Medication Sig Start Date End Date Taking? Authorizing Provider  acetaminophen (TYLENOL) 500 MG tablet Take 500 mg by mouth every 6 (six) hours as needed for moderate pain, fever or headache.    Yes Historical Provider, MD  amLODipine (NORVASC) 5 MG tablet Take 5 mg by mouth daily. AM   Yes Historical Provider, MD  aspirin 81 MG tablet Take 81 mg by mouth daily. AM   Yes Historical Provider, MD  benzonatate (TESSALON) 200 MG capsule Take 200 mg by mouth 3 (three) times daily as needed for cough.   Yes Historical Provider, MD  carvedilol (COREG) 3.125 MG tablet Take 6.25 mg by mouth daily.   Yes Historical Provider, MD  doxycycline (VIBRA-TABS) 100 MG tablet Take 100 mg by mouth 2 (two) times daily.   Yes Historical Provider, MD  magnesium oxide (MAG-OX) 400 MG tablet Take 400 mg by mouth daily. AM   Yes Historical Provider, MD  metFORMIN (GLUCOPHAGE-XR) 500 MG 24 hr  tablet Take 1,500 mg by mouth daily.   Yes Historical Provider, MD  omeprazole (PRILOSEC) 40 MG capsule Take 40 mg by mouth daily.   Yes Historical Provider, MD  ciprofloxacin (CIPRO) 500 MG tablet Take 1 tablet (500 mg total) by mouth 2 (two) times daily. Patient not taking: Reported on 12/22/2015 12/10/15   Melynda Ripple, MD  diazepam (VALIUM) 2 MG tablet Take 1 tablet (2 mg total) by mouth every 6 (six) hours as needed for anxiety. 06/04/16   Loney Hering, MD  lidocaine (LIDODERM) 5 % Place 1 patch onto the skin every 12 (twelve) hours. Remove & Discard patch within 12 hours or as directed by MD 06/04/16  06/04/17  Loney Hering, MD  metroNIDAZOLE (FLAGYL) 500 MG tablet Take 1 tablet (500 mg total) by mouth 3 (three) times daily. Patient not taking: Reported on 12/22/2015 12/10/15   Melynda Ripple, MD  ondansetron (ZOFRAN ODT) 4 MG disintegrating tablet Take 1 tablet (4 mg total) by mouth every 8 (eight) hours as needed for nausea or vomiting. Patient not taking: Reported on 06/04/2016 12/01/15   Gregor Hams, MD  ondansetron (ZOFRAN) 4 MG tablet Take 1 tablet (4 mg total) by mouth daily as needed for nausea or vomiting. Patient not taking: Reported on 06/04/2016 12/22/15   Earleen Newport, MD  oxyCODONE-acetaminophen (PERCOCET) 5-325 MG tablet Take 1-2 tablets by mouth every 6 (six) hours as needed for moderate pain or severe pain. Patient not taking: Reported on 06/04/2016 12/22/15   Earleen Newport, MD  oxyCODONE-acetaminophen (ROXICET) 5-325 MG tablet Take 1 tablet by mouth every 6 (six) hours as needed. 06/04/16   Loney Hering, MD  sucralfate (CARAFATE) 1 g tablet Take 1 tablet (1 g total) by mouth 4 (four) times daily. Patient not taking: Reported on 02/08/2016 12/22/15 12/21/16  Earleen Newport, MD    Allergies Tramadol and Motrin [ibuprofen]  History reviewed. No pertinent family history.  Social History Social History  Substance Use Topics  . Smoking status: Never Smoker  . Smokeless tobacco: Never Used  . Alcohol use No    Review of Systems Constitutional: No fever/chills Eyes: No visual changes. ENT: No sore throat. Cardiovascular: Denies chest pain. Respiratory: Denies shortness of breath. Gastrointestinal: No abdominal pain.  No nausea, no vomiting.  No diarrhea.  No constipation. Genitourinary: Negative for dysuria. Musculoskeletal: Neck pain Skin: Negative for rash. Neurological: Negative for headaches, focal weakness or numbness.  10-point ROS otherwise negative.  ____________________________________________   PHYSICAL EXAM:  VITAL SIGNS: ED  Triage Vitals [06/03/16 2310]  Enc Vitals Group     BP (!) 147/106     Pulse Rate 92     Resp 17     Temp 98.8 F (37.1 C)     Temp Source Oral     SpO2 95 %     Weight 170 lb (77.1 kg)     Height 5\' 4"  (1.626 m)     Head Circumference      Peak Flow      Pain Score 9     Pain Loc      Pain Edu?      Excl. in [place]?     Constitutional: Alert and oriented. Well appearing and in Moderate distress. Eyes: Conjunctivae are normal. PERRL. EOMI. Head: Atraumatic. Nose: No congestion/rhinnorhea. Mouth/Throat: Mucous membranes are moist.  Oropharynx non-erythematous. Neck: Right sided neck tenderness to palpation as well as some right lateral neck pain to palpation. Cardiovascular: Normal rate,  regular rhythm. Grossly normal heart sounds.  Good peripheral circulation. Respiratory: Normal respiratory effort.  No retractions. Lungs CTAB. Gastrointestinal: Soft and nontender. No distention. Positive bowel sounds Musculoskeletal: No lower extremity tenderness nor edema.   Neurologic:  Normal speech and language.  Skin:  Skin is warm, dry and intact.  Psychiatric: Mood and affect are normal.   ____________________________________________   LABS (all labs ordered are listed, but only abnormal results are displayed)  Labs Reviewed  CBC - Abnormal; Notable for the following:       Result Value   RBC 5.22 (*)    All other components within normal limits  BASIC METABOLIC PANEL - Abnormal; Notable for the following:    Sodium 133 (*)    Glucose, Bld 166 (*)    All other components within normal limits  TROPONIN I  TROPONIN I   ____________________________________________  EKG  ED ECG REPORT I, Loney Hering, the attending physician, personally viewed and interpreted this ECG.   Date: 06/04/2016  EKG Time: 2306  Rate: 84  Rhythm: normal sinus rhythm  Axis: normal  Intervals:none  ST&T Change:  none  ____________________________________________  RADIOLOGY  CXR ____________________________________________   PROCEDURES  Procedure(s) performed: None  Procedures  Critical Care performed: No  ____________________________________________   INITIAL IMPRESSION / ASSESSMENT AND PLAN / ED COURSE  Pertinent labs & imaging results that were available during my care of the patient were reviewed by me and considered in my medical decision making (see chart for details).  This is a 71 year old female who comes in with right sided neck pain going into her shoulder and her upper arm. The patient reports that the pain is even bad when she tries to move her arm. I did give the patient a dose of Percocet, Valium and a Lidoderm patch. When I did reassess the patient she reports that the pain was improved and tolerable. The patient was also able to move her arm. I did check some blood work in the event that this was some cardiac disease in the patient's blood work including her enzymes are negative. The patient's x-ray is also negative. I performed a chest x-ray given the patient's cough and she does not have a pneumonia. I will discharge the patient home to have her follow-up with her primary care physician. The patient has no further questions or concerns at this time.  Clinical Course as of Jun 04 329  Nancy Fetter Jun 04, 2016  0100 No active cardiopulmonary disease. DG Chest 2 View [AW]    Clinical Course User Index [AW] Loney Hering, MD     ____________________________________________   FINAL CLINICAL IMPRESSION(S) / ED DIAGNOSES  Final diagnoses:  Neck pain  Torticollis, acute      NEW MEDICATIONS STARTED DURING THIS VISIT:  Discharge Medication List as of 06/04/2016  2:52 AM    START taking these medications   Details  diazepam (VALIUM) 2 MG tablet Take 1 tablet (2 mg total) by mouth every 6 (six) hours as needed for anxiety., Starting Sun 06/04/2016, Print     lidocaine (LIDODERM) 5 % Place 1 patch onto the skin every 12 (twelve) hours. Remove & Discard patch within 12 hours or as directed by MD, Starting Sun 06/04/2016, Until Mon 06/04/2017, Print    !! oxyCODONE-acetaminophen (ROXICET) 5-325 MG tablet Take 1 tablet by mouth every 6 (six) hours as needed., Starting Sun 06/04/2016, Print     !! - Potential duplicate medications found. Please discuss with provider.  Note:  This document was prepared using Dragon voice recognition software and may include unintentional dictation errors.    Loney Hering, MD 06/04/16 [phone]

## 2016-08-08 DIAGNOSIS — K5792 Diverticulitis of intestine, part unspecified, without perforation or abscess without bleeding: Secondary | ICD-10-CM

## 2016-08-08 HISTORY — DX: Diverticulitis of intestine, part unspecified, without perforation or abscess without bleeding: K57.92

## 2016-08-20 ENCOUNTER — Emergency Department: Payer: Medicare Other

## 2016-08-20 ENCOUNTER — Observation Stay
Admission: EM | Admit: 2016-08-20 | Discharge: 2016-08-24 | Disposition: A | Discharge status: Home / Self Care | Payer: Medicare Other | Attending: Internal Medicine | Admitting: Internal Medicine

## 2016-08-20 ENCOUNTER — Encounter: Payer: Self-pay | Admitting: Emergency Medicine

## 2016-08-20 DIAGNOSIS — K219 Gastro-esophageal reflux disease without esophagitis: Secondary | ICD-10-CM | POA: Insufficient documentation

## 2016-08-20 DIAGNOSIS — R109 Unspecified abdominal pain: Secondary | ICD-10-CM | POA: Diagnosis present

## 2016-08-20 DIAGNOSIS — E872 Acidosis, unspecified: Secondary | ICD-10-CM

## 2016-08-20 DIAGNOSIS — Z886 Allergy status to analgesic agent status: Secondary | ICD-10-CM | POA: Diagnosis not present

## 2016-08-20 DIAGNOSIS — Z7984 Long term (current) use of oral hypoglycemic drugs: Secondary | ICD-10-CM | POA: Insufficient documentation

## 2016-08-20 DIAGNOSIS — Z89019 Acquired absence of unspecified thumb: Secondary | ICD-10-CM | POA: Insufficient documentation

## 2016-08-20 DIAGNOSIS — Z79899 Other long term (current) drug therapy: Secondary | ICD-10-CM | POA: Insufficient documentation

## 2016-08-20 DIAGNOSIS — K5732 Diverticulitis of large intestine without perforation or abscess without bleeding: Secondary | ICD-10-CM | POA: Diagnosis not present

## 2016-08-20 DIAGNOSIS — R05 Cough: Secondary | ICD-10-CM | POA: Diagnosis not present

## 2016-08-20 DIAGNOSIS — I959 Hypotension, unspecified: Secondary | ICD-10-CM | POA: Diagnosis not present

## 2016-08-20 DIAGNOSIS — I10 Essential (primary) hypertension: Secondary | ICD-10-CM | POA: Insufficient documentation

## 2016-08-20 DIAGNOSIS — E119 Type 2 diabetes mellitus without complications: Secondary | ICD-10-CM | POA: Diagnosis not present

## 2016-08-20 DIAGNOSIS — K5792 Diverticulitis of intestine, part unspecified, without perforation or abscess without bleeding: Secondary | ICD-10-CM | POA: Diagnosis present

## 2016-08-20 DIAGNOSIS — Z7982 Long term (current) use of aspirin: Secondary | ICD-10-CM | POA: Diagnosis not present

## 2016-08-20 DIAGNOSIS — E118 Type 2 diabetes mellitus with unspecified complications: Secondary | ICD-10-CM

## 2016-08-20 LAB — CBC WITH DIFFERENTIAL/PLATELET
BASOS ABS: 0 10*3/uL (ref 0–0.1)
BASOS PCT: 0 %
Eosinophils Absolute: 0.2 10*3/uL (ref 0–0.7)
Eosinophils Relative: 2 %
HEMATOCRIT: 43.6 % (ref 35.0–47.0)
HEMOGLOBIN: 14.8 g/dL (ref 12.0–16.0)
Lymphocytes Relative: 42 %
Lymphs Abs: 3.7 10*3/uL — ABNORMAL HIGH (ref 1.0–3.6)
MCH: 29.1 pg (ref 26.0–34.0)
MCHC: 33.9 g/dL (ref 32.0–36.0)
MCV: 85.7 fL (ref 80.0–100.0)
Monocytes Absolute: 0.6 10*3/uL (ref 0.2–0.9)
Monocytes Relative: 7 %
NEUTROS PCT: 49 %
Neutro Abs: 4.3 10*3/uL (ref 1.4–6.5)
Platelets: 254 10*3/uL (ref 150–440)
RBC: 5.09 MIL/uL (ref 3.80–5.20)
RDW: 14.2 % (ref 11.5–14.5)
WBC: 8.8 10*3/uL (ref 3.6–11.0)

## 2016-08-20 LAB — URINALYSIS, COMPLETE (UACMP) WITH MICROSCOPIC
BACTERIA UA: NONE SEEN
Bilirubin Urine: NEGATIVE
Glucose, UA: NEGATIVE mg/dL
KETONES UR: NEGATIVE mg/dL
Leukocytes, UA: NEGATIVE
Nitrite: NEGATIVE
PH: 5 (ref 5.0–8.0)
PROTEIN: NEGATIVE mg/dL
RBC / HPF: NONE SEEN RBC/hpf (ref 0–5)
Specific Gravity, Urine: 1.005 (ref 1.005–1.030)

## 2016-08-20 LAB — COMPREHENSIVE METABOLIC PANEL
ALBUMIN: 4 g/dL (ref 3.5–5.0)
ALT: 40 U/L (ref 14–54)
AST: 36 U/L (ref 15–41)
Alkaline Phosphatase: 79 U/L (ref 38–126)
Anion gap: 10 (ref 5–15)
BILIRUBIN TOTAL: 0.3 mg/dL (ref 0.3–1.2)
BUN: 14 mg/dL (ref 6–20)
CO2: 25 mmol/L (ref 22–32)
Calcium: 9.9 mg/dL (ref 8.9–10.3)
Chloride: 102 mmol/L (ref 101–111)
Creatinine, Ser: 0.97 mg/dL (ref 0.44–1.00)
GFR calc Af Amer: >60 mL/min (ref >60)
GFR calc non Af Amer: 57 mL/min — ABNORMAL LOW (ref >60)
GLUCOSE: 191 mg/dL — AB (ref 65–99)
POTASSIUM: 3.8 mmol/L (ref 3.5–5.1)
Sodium: 137 mmol/L (ref 135–145)
TOTAL PROTEIN: 7.4 g/dL (ref 6.5–8.1)

## 2016-08-20 LAB — LACTIC ACID, PLASMA: LACTIC ACID, VENOUS: 2.6 mmol/L — AB (ref 0.5–1.9)

## 2016-08-20 LAB — TROPONIN I: Troponin I: <0.03 ng/mL (ref <0.03)

## 2016-08-20 LAB — LIPASE, BLOOD: Lipase: 38 U/L (ref 11–51)

## 2016-08-20 MED ORDER — ONDANSETRON HCL 4 MG/2ML IJ SOLN
4.0000 mg | Freq: Once | INTRAMUSCULAR | Status: AC | PRN
  Administered 2016-08-20: 4 mg via INTRAVENOUS
  Filled 2016-08-20: qty 2

## 2016-08-20 MED ORDER — IOPAMIDOL (ISOVUE-300) INJECTION 61%
100.0000 mL | Freq: Once | INTRAVENOUS | Status: AC | PRN
  Administered 2016-08-20: 100 mL via INTRAVENOUS

## 2016-08-20 NOTE — ED Triage Notes (Signed)
Patient presents to Emergency Department via [facility] with complaints of right flank to lower abdominal pain.  Sudden onset approx 1 hour ago and pt reports pain feels like the pain has moved (from flank to lower abd) pt reports no hx of kidney stones.  Pt reports urinary frequency but diminished output.

## 2016-08-20 NOTE — ED Provider Notes (Signed)
[facility] Emergency Department Provider Note  ____________________________________________   First MD Initiated Contact with Patient 08/20/16 2338     (approximate)  I have reviewed the triage vital signs and the nursing notes.   HISTORY  Chief Complaint Abdominal Pain    HPI Alison Warren is a 71 y.o. female who comes to the emergency department with 1 day of insidious moderate severity cramping aching lower abdominal discomfort. It began when she awoke and is been associated with several episodes of diarrhea. She's had no fevers or chills. She has no abdominal surgical history. She does report a remote history of diverticulosis but is never had diverticulitis.She's had some nausea but no vomiting. No chest pain or shortness of breath. The pain is been constant. Nothing makes it better or worse. It is not postprandial.   Past Medical History:  Diagnosis Date  . Arthritis   . Bronchitis    none currently  . Chronic cough   . Costochondritis   . Diabetes mellitus without complication ([place])   . Diverticulitis   . GERD (gastroesophageal reflux disease)   . Hypercholesteremia   . Hypertension   . Wears hearing aid    right ear    There are no active problems to display for this patient.   Past Surgical History:  Procedure Laterality Date  . CATARACT EXTRACTION W/ INTRAOCULAR LENS IMPLANT    . CATARACT EXTRACTION W/PHACO Right 12/07/2014   Procedure: CATARACT EXTRACTION PHACO AND INTRAOCULAR LENS PLACEMENT ([place]);  Surgeon: Ronnell Freshwater, MD;  Location: [place];  Service: Ophthalmology;  Laterality: Right;  DIABETIC - oral meds PER DR VIN START TIME 8AM  . CHOLECYSTECTOMY    . COLONOSCOPY    . COLONOSCOPY N/A 02/08/2016   Procedure: COLONOSCOPY;  Surgeon: Lollie Sails, MD;  Location: [facility] ENDOSCOPY;  Service: Endoscopy;  Laterality: N/A;  Diabetes  . MASTOIDECTOMY    . THUMB AMPUTATION     removal of 2nd thumb  .  TONSILLECTOMY      Prior to Admission medications   Medication Sig Start Date End Date Taking? Authorizing Provider  acetaminophen (TYLENOL) 500 MG tablet Take 500 mg by mouth every 6 (six) hours as needed for moderate pain, fever or headache.    Yes [provider]  amLODipine (NORVASC) 5 MG tablet Take 5 mg by mouth daily. AM   Yes [provider]  aspirin 81 MG tablet Take 81 mg by mouth daily. AM   Yes [provider]  carvedilol (COREG) 3.125 MG tablet Take 6.25 mg by mouth daily.   Yes [provider]  metFORMIN (GLUCOPHAGE-XR) 500 MG 24 hr tablet Take 1,500 mg by mouth daily.   Yes [provider]  omeprazole (PRILOSEC) 40 MG capsule Take 40 mg by mouth daily.   Yes [provider]  benzonatate (TESSALON) 200 MG capsule Take 200 mg by mouth 3 (three) times daily as needed for cough.    [provider]  ciprofloxacin (CIPRO) 500 MG tablet Take 1 tablet (500 mg total) by mouth 2 (two) times daily. Patient not taking: Reported on 12/22/2015 12/10/15   Melynda Ripple, MD  diazepam (VALIUM) 2 MG tablet Take 1 tablet (2 mg total) by mouth every 6 (six) hours as needed for anxiety. Patient not taking: Reported on 08/21/2016 06/04/16   Loney Hering, MD  doxycycline (VIBRA-TABS) 100 MG tablet Take 100 mg by mouth 2 (two) times daily.    [provider]  lidocaine (LIDODERM) 5 % Place 1 patch onto the skin every 12 (twelve) hours. Remove & Discard patch within 12 hours or as directed by MD Patient not taking: Reported on 08/21/2016 06/04/16 06/04/17  Loney Hering, MD  magnesium oxide (MAG-OX) 400 MG tablet Take 400 mg by mouth daily. AM    [provider]  metroNIDAZOLE (FLAGYL) 500 MG tablet Take 1 tablet (500 mg total) by mouth 3 (three) times daily. Patient not taking: Reported on 12/22/2015 12/10/15   Melynda Ripple, MD  ondansetron (ZOFRAN ODT) 4 MG disintegrating tablet Take 1 tablet (4 mg total) by mouth  every 8 (eight) hours as needed for nausea or vomiting. Patient not taking: Reported on 06/04/2016 12/01/15   Gregor Hams, MD  ondansetron (ZOFRAN) 4 MG tablet Take 1 tablet (4 mg total) by mouth daily as needed for nausea or vomiting. Patient not taking: Reported on 06/04/2016 12/22/15   Earleen Newport, MD  oxyCODONE-acetaminophen (PERCOCET) 5-325 MG tablet Take 1-2 tablets by mouth every 6 (six) hours as needed for moderate pain or severe pain. Patient not taking: Reported on 06/04/2016 12/22/15   Earleen Newport, MD  oxyCODONE-acetaminophen (ROXICET) 5-325 MG tablet Take 1 tablet by mouth every 6 (six) hours as needed. Patient not taking: Reported on 08/21/2016 06/04/16   Loney Hering, MD  sucralfate (CARAFATE) 1 g tablet Take 1 tablet (1 g total) by mouth 4 (four) times daily. Patient not taking: Reported on 02/08/2016 12/22/15 12/21/16  Earleen Newport, MD    Allergies Tramadol and Motrin [ibuprofen]  History reviewed. No pertinent family history.  Social History Social History  Substance Use Topics  . Smoking status: Never Smoker  . Smokeless tobacco: Never Used  . Alcohol use No    Review of Systems Constitutional: No fever/chills Eyes: No visual changes. ENT: No sore throat. Cardiovascular: Denies chest pain. Respiratory: Denies shortness of breath. Gastrointestinal: Positive abdominal pain.  Positive nausea, no vomiting.  Positive diarrhea.  No constipation. Genitourinary: Negative for dysuria. Musculoskeletal: Negative for back pain. Skin: Negative for rash. Neurological: Negative for headaches, focal weakness or numbness.  10-point ROS otherwise negative.  ____________________________________________   PHYSICAL EXAM:  VITAL SIGNS: ED Triage Vitals  Enc Vitals Group     BP 08/20/16 2237 (!) 150/85     Pulse Rate 08/20/16 2237 77     Resp 08/20/16 2237 (!) 21     Temp 08/20/16 2237 98.7 F (37.1 C)     Temp Source 08/20/16 2237 Oral      SpO2 08/20/16 2236 97 %     Weight 08/20/16 2238 180 lb (81.6 kg)     Height 08/20/16 2238 5\' 4"  (1.626 m)     Head Circumference --      Peak Flow --      Pain Score 08/20/16 2237 10     Pain Loc --      Pain Edu? --      Excl. in [place]? --     Constitutional: Alert and oriented x 4 Appears uncomfortable but no diaphoresis speaks in full clear sentences Eyes: PERRL EOMI. Head: Atraumatic. Nose: No congestion/rhinnorhea. Mouth/Throat: No trismus Neck: No stridor.   Cardiovascular: Normal rate, regular rhythm. Grossly normal heart sounds.  Good peripheral circulation. Respiratory: Normal respiratory effort.  No retractions. Lungs CTAB and moving good air Gastrointestinal: Soft nondistended she is tender in her left greater than right lower quadrant but without rebound or guarding or frank peritonitis Musculoskeletal: No lower extremity  edema   Neurologic:  Normal speech and language. No gross focal neurologic deficits are appreciated. Skin:  Skin is warm, dry and intact. No rash noted. Psychiatric: Mood and affect are normal. Speech and behavior are normal.    ____________________________________________   DIFFERENTIAL  Appendicitis, diverticulitis, pyelonephritis, nephrolithiasis, volvulus, acute coronary syndrome ____________________________________________   LABS (all labs ordered are listed, but only abnormal results are displayed)  Labs Reviewed  COMPREHENSIVE METABOLIC PANEL - Abnormal; Notable for the following:       Result Value   Glucose, Bld 191 (*)    GFR calc non Af Amer 57 (*)    All other components within normal limits  URINALYSIS, COMPLETE (UACMP) WITH MICROSCOPIC - Abnormal; Notable for the following:    Color, Urine STRAW (*)    APPearance CLEAR (*)    Hgb urine dipstick SMALL (*)    Squamous Epithelial / LPF 0-5 (*)    All other components within normal limits  CBC WITH DIFFERENTIAL/PLATELET - Abnormal; Notable for the following:    Lymphs Abs 3.7 (*)     All other components within normal limits  LACTIC ACID, PLASMA - Abnormal; Notable for the following:    Lactic Acid, Venous 2.6 (*)    All other components within normal limits  LIPASE, BLOOD  TROPONIN I  LACTIC ACID, PLASMA    Slightly elevated lactic acid of unclear clinical significance labs otherwise unremarkable __________________________________________  EKG   ____________________________________________  RADIOLOGY  CT scans shows uncomplicated diverticulitis ____________________________________________   PROCEDURES  Procedure(s) performed: no  Procedures  Critical Care performed: no  Observation: no ____________________________________________   INITIAL IMPRESSION / ASSESSMENT AND PLAN / ED COURSE  Pertinent labs & imaging results that were available during my care of the patient were reviewed by me and considered in my medical decision making (see chart for details).  The patient arrives quite uncomfortable appearing with a tender abdomen. Given her advanced age and concerned about surgical abdominal processes so I obtained a CT scan with IV contrast. CT does confirm simple diverticulitis. She is afebrile and has no white count but she does have a slightly elevated lactic acid. I offered the patient inpatient admission for IV fluid hydration and antibiotics however she declined stating she would prefer to go home which I think is entirely reasonable. Her pain is too great at this point to go home so I will treat her with a dose of morphine and reevaluate.    ----------------------------------------- 2:28 AM on 08/21/2016 -----------------------------------------  After several hours of observation and intravenous morphine the patient's pain is still significant and she does not feel comfortable going home. I think this is reasonable especially considering her elevated lactate. She will require another dose of IV morphine as well as inpatient  admission.  ____________________________________________   FINAL CLINICAL IMPRESSION(S) / ED DIAGNOSES  Final diagnoses:  Diverticulitis      NEW MEDICATIONS STARTED DURING THIS VISIT:  New Prescriptions   No medications on file     Note:  This document was prepared using Dragon voice recognition software and may include unintentional dictation errors.     Darel Hong, MD 08/21/16 [phone]

## 2016-08-20 NOTE — ED Notes (Signed)
CRITICAL LAB: [facility]IC is 2.6 , [facility], Dr. Mable Paris notified, orders received

## 2016-08-21 ENCOUNTER — Encounter: Payer: Self-pay | Admitting: Internal Medicine

## 2016-08-21 DIAGNOSIS — K5792 Diverticulitis of intestine, part unspecified, without perforation or abscess without bleeding: Secondary | ICD-10-CM | POA: Diagnosis present

## 2016-08-21 LAB — LACTIC ACID, PLASMA: LACTIC ACID, VENOUS: 2.3 mmol/L — AB (ref 0.5–1.9)

## 2016-08-21 LAB — GLUCOSE, CAPILLARY
GLUCOSE-CAPILLARY: 148 mg/dL — AB (ref 65–99)
GLUCOSE-CAPILLARY: 134 mg/dL — AB (ref 65–99)
Glucose-Capillary: 150 mg/dL — ABNORMAL HIGH (ref 65–99)
Glucose-Capillary: 153 mg/dL — ABNORMAL HIGH (ref 65–99)

## 2016-08-21 MED ORDER — SODIUM CHLORIDE 0.9 % IV SOLN
INTRAVENOUS | Status: DC
  Administered 2016-08-21 (×2): via INTRAVENOUS

## 2016-08-21 MED ORDER — PANTOPRAZOLE SODIUM 40 MG PO TBEC
40.0000 mg | DELAYED_RELEASE_TABLET | Freq: Every day | ORAL | Status: DC
  Administered 2016-08-21 – 2016-08-24 (×4): 40 mg via ORAL
  Filled 2016-08-21 (×4): qty 1

## 2016-08-21 MED ORDER — INSULIN ASPART 100 UNIT/ML ~~LOC~~ SOLN
0.0000 [IU] | Freq: Three times a day (TID) | SUBCUTANEOUS | Status: DC
  Administered 2016-08-21: 1 [IU] via SUBCUTANEOUS
  Administered 2016-08-21: 2 [IU] via SUBCUTANEOUS
  Administered 2016-08-21 – 2016-08-22 (×2): 1 [IU] via SUBCUTANEOUS
  Administered 2016-08-22: 2 [IU] via SUBCUTANEOUS
  Administered 2016-08-22 – 2016-08-23 (×2): 1 [IU] via SUBCUTANEOUS
  Administered 2016-08-23 (×2): 2 [IU] via SUBCUTANEOUS
  Administered 2016-08-24: 1 [IU] via SUBCUTANEOUS
  Filled 2016-08-21 (×2): qty 1
  Filled 2016-08-21 (×3): qty 2
  Filled 2016-08-21: qty 1
  Filled 2016-08-21: qty 2
  Filled 2016-08-21 (×2): qty 1

## 2016-08-21 MED ORDER — ACETAMINOPHEN 325 MG PO TABS
650.0000 mg | ORAL_TABLET | Freq: Four times a day (QID) | ORAL | Status: DC | PRN
  Administered 2016-08-22 – 2016-08-23 (×3): 650 mg via ORAL
  Filled 2016-08-21 (×3): qty 2

## 2016-08-21 MED ORDER — MORPHINE SULFATE (PF) 4 MG/ML IV SOLN
4.0000 mg | Freq: Once | INTRAVENOUS | Status: AC
Start: 2016-08-21 — End: 2016-08-21
  Administered 2016-08-21: 4 mg via INTRAVENOUS
  Filled 2016-08-21: qty 1

## 2016-08-21 MED ORDER — MORPHINE SULFATE (PF) 4 MG/ML IV SOLN
4.0000 mg | Freq: Once | INTRAVENOUS | Status: AC
  Administered 2016-08-21: 4 mg via INTRAVENOUS
  Filled 2016-08-21: qty 1

## 2016-08-21 MED ORDER — POTASSIUM CHLORIDE IN NACL 20-0.9 MEQ/L-% IV SOLN
INTRAVENOUS | Status: DC
  Administered 2016-08-21 – 2016-08-23 (×5): via INTRAVENOUS
  Filled 2016-08-21 (×8): qty 1000

## 2016-08-21 MED ORDER — MAGNESIUM OXIDE 400 (241.3 MG) MG PO TABS
400.0000 mg | ORAL_TABLET | Freq: Every day | ORAL | Status: DC
  Administered 2016-08-21 – 2016-08-24 (×4): 400 mg via ORAL
  Filled 2016-08-21 (×4): qty 1

## 2016-08-21 MED ORDER — CIPROFLOXACIN IN D5W 400 MG/200ML IV SOLN
400.0000 mg | Freq: Two times a day (BID) | INTRAVENOUS | Status: DC
  Administered 2016-08-21 – 2016-08-23 (×5): 400 mg via INTRAVENOUS
  Filled 2016-08-21 (×6): qty 200

## 2016-08-21 MED ORDER — METRONIDAZOLE 500 MG PO TABS
500.0000 mg | ORAL_TABLET | Freq: Once | ORAL | Status: AC
  Administered 2016-08-21: 500 mg via ORAL
  Filled 2016-08-21: qty 1

## 2016-08-21 MED ORDER — AMLODIPINE BESYLATE 5 MG PO TABS
5.0000 mg | ORAL_TABLET | Freq: Every day | ORAL | Status: DC
  Administered 2016-08-21 – 2016-08-24 (×4): 5 mg via ORAL
  Filled 2016-08-21 (×4): qty 1

## 2016-08-21 MED ORDER — METRONIDAZOLE IN NACL 5-0.79 MG/ML-% IV SOLN
500.0000 mg | Freq: Three times a day (TID) | INTRAVENOUS | Status: DC
  Administered 2016-08-21 – 2016-08-22 (×5): 500 mg via INTRAVENOUS
  Filled 2016-08-21 (×6): qty 100

## 2016-08-21 MED ORDER — ENOXAPARIN SODIUM 40 MG/0.4ML ~~LOC~~ SOLN
40.0000 mg | SUBCUTANEOUS | Status: DC
  Administered 2016-08-21 – 2016-08-24 (×4): 40 mg via SUBCUTANEOUS
  Filled 2016-08-21 (×4): qty 0.4

## 2016-08-21 MED ORDER — CARVEDILOL 6.25 MG PO TABS
6.2500 mg | ORAL_TABLET | Freq: Every day | ORAL | Status: DC
  Administered 2016-08-21 – 2016-08-24 (×4): 6.25 mg via ORAL
  Filled 2016-08-21 (×4): qty 1

## 2016-08-21 MED ORDER — SODIUM CHLORIDE 0.9 % IV BOLUS (SEPSIS)
1000.0000 mL | Freq: Once | INTRAVENOUS | Status: AC
  Administered 2016-08-21: 1000 mL via INTRAVENOUS

## 2016-08-21 MED ORDER — ONDANSETRON HCL 4 MG/2ML IJ SOLN
4.0000 mg | Freq: Four times a day (QID) | INTRAMUSCULAR | Status: DC | PRN
  Administered 2016-08-22: 4 mg via INTRAVENOUS
  Filled 2016-08-21 (×2): qty 2

## 2016-08-21 MED ORDER — ACETAMINOPHEN 650 MG RE SUPP: 650.0000 mg | Freq: Four times a day (QID) | RECTAL | Status: DC | PRN

## 2016-08-21 MED ORDER — ONDANSETRON HCL 4 MG PO TABS: 4.0000 mg | ORAL_TABLET | Freq: Four times a day (QID) | ORAL | Status: DC | PRN

## 2016-08-21 MED ORDER — AMOXICILLIN-POT CLAVULANATE 875-125 MG PO TABS
1.0000 | ORAL_TABLET | Freq: Once | ORAL | Status: AC
  Administered 2016-08-21: 1 via ORAL
  Filled 2016-08-21: qty 1

## 2016-08-21 MED ORDER — DIAZEPAM 2 MG PO TABS: 2.0000 mg | ORAL_TABLET | Freq: Four times a day (QID) | ORAL | Status: DC | PRN

## 2016-08-21 MED ORDER — INSULIN ASPART 100 UNIT/ML ~~LOC~~ SOLN: 0.0000 [IU] | Freq: Every day | SUBCUTANEOUS | Status: DC

## 2016-08-21 MED ORDER — MORPHINE SULFATE (PF) 2 MG/ML IV SOLN
2.0000 mg | INTRAVENOUS | Status: DC | PRN
  Administered 2016-08-21 – 2016-08-22 (×7): 2 mg via INTRAVENOUS
  Filled 2016-08-21 (×7): qty 1

## 2016-08-21 NOTE — Care Management (Signed)
Patient admitted for diverticulitis.  Patient lives at home alone. PCP [facility].  Pharmacy [facility].  Patient states that at baseline she is independent.  Ambulates with a cane. No RNCM needs identified at this time.

## 2016-08-21 NOTE — H&P (Signed)
[facility] [place] NAME: Alison Warren    MR#:  [id number]  DATE OF BIRTH:  1946-03-27  DATE OF ADMISSION:  08/20/2016  PRIMARY CARE PHYSICIAN: Ezequiel Kayser, MD   REQUESTING/REFERRING PHYSICIAN:   CHIEF COMPLAINT:   Chief Complaint  Patient presents with  . Abdominal Pain    HISTORY OF PRESENT ILLNESS: Alison Warren  is a 71 y.o. female with a known history of Diverticular disease, diabetes mellitus, GERD, hyperlipidemia, hypertension, arthritis presented to the emergency room with abdominal pain and vomiting. Abdominal pain is located in the lower abdomen which was aching in nature 7 out of 10 on scale of 1-10. The pain has been going on for the last 2 days .Patient also has some nausea and vomiting. No history of any hematemesis, hemoptysis and rectal bleed. She was evaluated in the emergency room her lactate level was elevated. Patient was given IV ciprofloxacin and IV metronidazole antibiotics in the emergency room. She was worked up with a CT abdomen which showed a diverticulitis. Hospitalist service was consulted for further care of the patient.  PAST MEDICAL HISTORY:   Past Medical History:  Diagnosis Date  . Arthritis   . Bronchitis    none currently  . Chronic cough   . Costochondritis   . Diabetes mellitus without complication ([place])   . Diverticulitis   . GERD (gastroesophageal reflux disease)   . Hypercholesteremia   . Hypertension   . Wears hearing aid    right ear    PAST SURGICAL HISTORY: Past Surgical History:  Procedure Laterality Date  . CATARACT EXTRACTION W/ INTRAOCULAR LENS IMPLANT    . CATARACT EXTRACTION W/PHACO Right 12/07/2014   Procedure: CATARACT EXTRACTION PHACO AND INTRAOCULAR LENS PLACEMENT ([place]);  Surgeon: Ronnell Freshwater, MD;  Location: [facility];  Service: Ophthalmology;  Laterality: Right;  DIABETIC - oral meds PER DR VIN START TIME 8AM  . CHOLECYSTECTOMY    . COLONOSCOPY     . COLONOSCOPY N/A 02/08/2016   Procedure: COLONOSCOPY;  Surgeon: Lollie Sails, MD;  Location: [facility] ENDOSCOPY;  Service: Endoscopy;  Laterality: N/A;  Diabetes  . MASTOIDECTOMY    . THUMB AMPUTATION     removal of 2nd thumb  . TONSILLECTOMY      SOCIAL HISTORY:  Social History  Substance Use Topics  . Smoking status: Never Smoker  . Smokeless tobacco: Never Used  . Alcohol use No    FAMILY HISTORY:  Family History  Problem Relation Age of Onset  . Heart attack Father   . Diabetes Neg Hx   . COPD Neg Hx     DRUG ALLERGIES:  Allergies  Allergen Reactions  . Tramadol Nausea And Vomiting  . Motrin [Ibuprofen] Rash    REVIEW OF SYSTEMS:   CONSTITUTIONAL: No fever, fatigue or weakness.  EYES: No blurred or double vision.  EARS, NOSE, AND THROAT: No tinnitus or ear pain.  RESPIRATORY: No cough, shortness of breath, wheezing or hemoptysis.  CARDIOVASCULAR: No chest pain, orthopnea, edema.  GASTROINTESTINAL: Has nausea, vomiting, abdominal pain.  No diarrhoea noted. GENITOURINARY: No dysuria, hematuria.  ENDOCRINE: No polyuria, nocturia,  HEMATOLOGY: No anemia, easy bruising or bleeding SKIN: No rash or lesion. MUSCULOSKELETAL: No joint pain or arthritis.   NEUROLOGIC: No tingling, numbness, weakness.  PSYCHIATRY: No anxiety or depression.   MEDICATIONS AT HOME:  Prior to Admission medications   Medication Sig Start Date End Date Taking? Authorizing Provider  acetaminophen (TYLENOL) 500 MG  tablet Take 500 mg by mouth every 6 (six) hours as needed for moderate pain, fever or headache.    Yes [provider]  amLODipine (NORVASC) 5 MG tablet Take 5 mg by mouth daily. AM   Yes [provider]  aspirin 81 MG tablet Take 81 mg by mouth daily. AM   Yes [provider]  carvedilol (COREG) 3.125 MG tablet Take 6.25 mg by mouth daily.   Yes [provider]  metFORMIN (GLUCOPHAGE-XR) 500 MG 24 hr tablet Take 1,500 mg by mouth daily.    Yes [provider]  omeprazole (PRILOSEC) 40 MG capsule Take 40 mg by mouth daily.   Yes [provider]  benzonatate (TESSALON) 200 MG capsule Take 200 mg by mouth 3 (three) times daily as needed for cough.    [provider]  ciprofloxacin (CIPRO) 500 MG tablet Take 1 tablet (500 mg total) by mouth 2 (two) times daily. Patient not taking: Reported on 12/22/2015 12/10/15   Melynda Ripple, MD  diazepam (VALIUM) 2 MG tablet Take 1 tablet (2 mg total) by mouth every 6 (six) hours as needed for anxiety. Patient not taking: Reported on 08/21/2016 06/04/16   Loney Hering, MD  doxycycline (VIBRA-TABS) 100 MG tablet Take 100 mg by mouth 2 (two) times daily.    [provider]  lidocaine (LIDODERM) 5 % Place 1 patch onto the skin every 12 (twelve) hours. Remove & Discard patch within 12 hours or as directed by MD Patient not taking: Reported on 08/21/2016 06/04/16 06/04/17  Loney Hering, MD  magnesium oxide (MAG-OX) 400 MG tablet Take 400 mg by mouth daily. AM    [provider]  metroNIDAZOLE (FLAGYL) 500 MG tablet Take 1 tablet (500 mg total) by mouth 3 (three) times daily. Patient not taking: Reported on 12/22/2015 12/10/15   Melynda Ripple, MD  ondansetron (ZOFRAN ODT) 4 MG disintegrating tablet Take 1 tablet (4 mg total) by mouth every 8 (eight) hours as needed for nausea or vomiting. Patient not taking: Reported on 06/04/2016 12/01/15   Gregor Hams, MD  ondansetron (ZOFRAN) 4 MG tablet Take 1 tablet (4 mg total) by mouth daily as needed for nausea or vomiting. Patient not taking: Reported on 06/04/2016 12/22/15   Earleen Newport, MD  oxyCODONE-acetaminophen (PERCOCET) 5-325 MG tablet Take 1-2 tablets by mouth every 6 (six) hours as needed for moderate pain or severe pain. Patient not taking: Reported on 06/04/2016 12/22/15   Earleen Newport, MD  oxyCODONE-acetaminophen (ROXICET) 5-325 MG tablet Take 1 tablet by mouth every 6 (six) hours  as needed. Patient not taking: Reported on 08/21/2016 06/04/16   Loney Hering, MD  sucralfate (CARAFATE) 1 g tablet Take 1 tablet (1 g total) by mouth 4 (four) times daily. Patient not taking: Reported on 02/08/2016 12/22/15 12/21/16  Earleen Newport, MD      PHYSICAL EXAMINATION:   VITAL SIGNS: Blood pressure (!) 150/86, pulse 72, temperature 98.7 F (37.1 C), temperature source Oral, resp. rate 13, height 5\' 4"  (1.626 m), weight 81.6 kg (180 lb), SpO2 95 %.  GENERAL:  71 y.o.-year-old patient lying in the bed with no acute distress.  EYES: Pupils equal, round, reactive to light and accommodation. No scleral icterus. Extraocular muscles intact.  HEENT: Head atraumatic, normocephalic. Oropharynx and nasopharynx clear.  NECK:  Supple, no jugular venous distention. No thyroid enlargement, no tenderness.  LUNGS: Normal breath sounds bilaterally, no wheezing, rales,rhonchi or crepitation. No use of accessory muscles  of respiration.  CARDIOVASCULAR: S1, S2 normal. No murmurs, rubs, or gallops.  ABDOMEN: Soft, tenderness present around umbilicus and left lower quadrant, nondistended. Bowel sounds present. No organomegaly or mass.  EXTREMITIES: No pedal edema, cyanosis, or clubbing.  NEUROLOGIC: Cranial nerves II through XII are intact. Muscle strength 5/5 in all extremities. Sensation intact. Gait not checked.  PSYCHIATRIC: The patient is alert and oriented x 3.  SKIN: No obvious rash, lesion, or ulcer.   LABORATORY PANEL:   CBC  Recent Labs Lab 08/20/16 2245  WBC 8.8  HGB 14.8  HCT 43.6  PLT 254  MCV 85.7  MCH 29.1  MCHC 33.9  RDW 14.2  LYMPHSABS 3.7*  MONOABS 0.6  EOSABS 0.2  BASOSABS 0.0   ------------------------------------------------------------------------------------------------------------------  Chemistries   Recent Labs Lab 08/20/16 2245  NA 137  K 3.8  CL 102  CO2 25  GLUCOSE 191*  BUN 14  CREATININE 0.97  CALCIUM 9.9  AST 36  ALT 40  ALKPHOS  79  BILITOT 0.3   ------------------------------------------------------------------------------------------------------------------ estimated creatinine clearance is 54.2 mL/min (by C-G formula based on SCr of 0.97 mg/dL). ------------------------------------------------------------------------------------------------------------------ No results for input(s): TSH, T4TOTAL, T3FREE, THYROIDAB in the last 72 hours.  Invalid input(s): FREET3   Coagulation profile No results for input(s): INR, PROTIME in the last 168 hours. ------------------------------------------------------------------------------------------------------------------- No results for input(s): DDIMER in the last 72 hours. -------------------------------------------------------------------------------------------------------------------  Cardiac Enzymes  Recent Labs Lab 08/20/16 2245  TROPONINI <0.03   ------------------------------------------------------------------------------------------------------------------ Invalid input(s): POCBNP  ---------------------------------------------------------------------------------------------------------------  Urinalysis    Component Value Date/Time   COLORURINE STRAW (A) 08/20/2016 2245   APPEARANCEUR CLEAR (A) 08/20/2016 2245   APPEARANCEUR Clear 09/06/2013 0833   LABSPEC 1.005 08/20/2016 2245   LABSPEC 1.008 09/06/2013 0833   PHURINE 5.0 08/20/2016 2245   GLUCOSEU NEGATIVE 08/20/2016 2245   GLUCOSEU >=500 09/06/2013 0833   HGBUR SMALL (A) 08/20/2016 2245   BILIRUBINUR NEGATIVE 08/20/2016 2245   BILIRUBINUR Negative 09/06/2013 0833   KETONESUR NEGATIVE 08/20/2016 2245   PROTEINUR NEGATIVE 08/20/2016 2245   NITRITE NEGATIVE 08/20/2016 2245   LEUKOCYTESUR NEGATIVE 08/20/2016 2245   LEUKOCYTESUR Negative 09/06/2013 0833     RADIOLOGY: Ct Abdomen Pelvis W Contrast  Result Date: 08/20/2016 CLINICAL DATA:  Right flank to lower abdominal pain EXAM: CT ABDOMEN  AND PELVIS WITH CONTRAST TECHNIQUE: Multidetector CT imaging of the abdomen and pelvis was performed using the standard protocol following bolus administration of intravenous contrast. CONTRAST:  130mL ISOVUE-300 IOPAMIDOL (ISOVUE-300) INJECTION 61% COMPARISON:  12/01/2015 CT FINDINGS: Lower chest: Normal size cardiac chambers. No pericardial effusion. Bibasilar dependent atelectasis. Hepatobiliary: Cirrhotic appearing liver without space-occupying mass. No biliary dilatation. Status post cholecystectomy. Pancreas: Normal Spleen: Normal.  Small adjacent splenules are again seen. Adrenals/Urinary Tract: Normal bilateral adrenal glands and kidneys. No obstructive uropathy. Unremarkable appearance of the bladder. Stomach/Bowel: Physiologically distended stomach. Normal small bowel rotation without small-bowel obstruction or inflammation. Pericolonic inflammation at the junction of the descending and sigmoid colon consistent with mild diverticulitis. Circular muscle hypertrophy and diverticulosis along the mid and distal sigmoid. Normal appendix. Vascular/Lymphatic: No significant vascular findings are present. No enlarged abdominal or pelvic lymph nodes. Reproductive: Uterus and bilateral adnexa are unremarkable. Other: No pneumoperitoneum.  No abdominopelvic ascites. Musculoskeletal: Thoracolumbar spondylosis with disc-osteophyte complexes at L1-2 and L3-4. Associated lumbar facet arthropathy. IMPRESSION: 1. Mild diverticulitis at the junction of the descending and sigmoid colon without complication. 2. No obstructive uropathy. 3. Cirrhosis of the liver.  Status post cholecystectomy. Electronically Signed   By: Shanon Brow  Randel Pigg M.D.   On: 08/20/2016 23:59    EKG: Orders placed or performed during the hospital encounter of 08/20/16  . ED EKG  . ED EKG    IMPRESSION AND PLAN: 71 year old female patient with history of diverticular disease, diabetes mellitus, hyperlipidemia, hypertension, arthritis presented to the  emergency room with abdominal pain, nausea and vomiting. Admitting diagnosis 1. Acute diverticulitis 2. Abdominal pain 3. Nausea and vomiting 4. Type 2 diabetes mellitus 5. Hypertension 6.Elevated lactate level Treatment plan It patient to medical floor observation bed IV fluid hydration Start patient on IV ciprofloxacin and IV Flagyl antibiotics Pain management with IV morphine Antiemetics Repeat lactate level  All the records are reviewed and case discussed with ED provider. Management plans discussed with the patient, family and they are in agreement.  CODE STATUS:FULL CODE Surrogate decision maker : son Code Status History    This patient does not have a recorded code status. Please follow your organizational policy for patients in this situation.       TOTAL TIME TAKING CARE OF THIS PATIENT: 50 minutes.    Saundra Shelling M.D on 08/21/2016 at 2:54 AM  Between 7am to 6pm - Pager - [phone]  After 6pm go to [email or web address] - password [email or web address] [facility]  [place] [facility]  Office  [phone]  CC: Primary care physician; Ezequiel Kayser, MD

## 2016-08-21 NOTE — Progress Notes (Signed)
Patient is a member of [employer] here in [place]. Chaplain prayed for patient as she requested.   08/21/16 1200  Clinical Encounter Type  Visited With Patient  Visit Type Initial  Referral From Nurse  Consult/Referral To Chaplain  Spiritual Encounters  Spiritual Needs Prayer

## 2016-08-21 NOTE — Progress Notes (Addendum)
[facility] at [place] NAME: Alison Warren    MR#:  [id number]  DATE OF BIRTH:  1946-04-02  SUBJECTIVE:  CHIEF COMPLAINT:   Chief Complaint  Patient presents with  . Abdominal Pain   Patient is a 71 year old Caucasian female with past medical history significant for history of diverticular disease, diabetes, gastroesophageal reflux, hyperlipidemia, hypertension, arthritis, who presented to the hospital with complaints of lower abdominal pain, vomiting for the past 2 days, no hematemesis, hemoptysis or rectal bleed. CT scan of abdomen revealed diverticulitis, patient was admitted. She was initiated on ciprofloxacin and Flagyl intravenously. She feels a little bit better today, still complains of significant suprapubic abdominal pain, no diarrhea   Review of Systems  Constitutional: Negative for chills, fever and weight loss.  HENT: Negative for congestion.   Eyes: Negative for blurred vision and double vision.  Respiratory: Negative for cough, sputum production, shortness of breath and wheezing.   Cardiovascular: Negative for chest pain, palpitations, orthopnea, leg swelling and PND.  Gastrointestinal: Positive for abdominal pain. Negative for blood in stool, constipation, diarrhea, nausea and vomiting.  Genitourinary: Negative for dysuria, frequency, hematuria and urgency.  Musculoskeletal: Negative for falls.  Neurological: Negative for dizziness, tremors, focal weakness and headaches.  Endo/Heme/Allergies: Does not bruise/bleed easily.  Psychiatric/Behavioral: Negative for depression. The patient does not have insomnia.     VITAL SIGNS: Blood pressure 116/73, pulse 64, temperature 97.8 F (36.6 C), temperature source Oral, resp. rate 18, height 5\' 4"  (1.626 m), weight 82.1 kg (181 lb), SpO2 94 %.  PHYSICAL EXAMINATION:   GENERAL:  71 y.o.-year-old patient lying in the bed with no acute distress.  EYES: Pupils equal, round,  reactive to light and accommodation. No scleral icterus. Extraocular muscles intact.  HEENT: Head atraumatic, normocephalic. Oropharynx and nasopharynx clear.  NECK:  Supple, no jugular venous distention. No thyroid enlargement, no tenderness.  LUNGS: Normal breath sounds bilaterally, no wheezing, rales,rhonchi or crepitation. No use of accessory muscles of respiration.  CARDIOVASCULAR: S1, S2 normal. No murmurs, rubs, or gallops.  ABDOMEN: Soft, significant tenderness in the lower abdomen, but no rebound, voluntary guarding was noted, nondistended. Bowel sounds present. No organomegaly or mass.  EXTREMITIES: No pedal edema, cyanosis, or clubbing.  NEUROLOGIC: Cranial nerves II through XII are intact. Muscle strength 5/5 in all extremities. Sensation intact. Gait not checked.  PSYCHIATRIC: The patient is alert and oriented x 3.  SKIN: No obvious rash, lesion, or ulcer.   ORDERS/RESULTS REVIEWED:   CBC  Recent Labs Lab 08/20/16 2245  WBC 8.8  HGB 14.8  HCT 43.6  PLT 254  MCV 85.7  MCH 29.1  MCHC 33.9  RDW 14.2  LYMPHSABS 3.7*  MONOABS 0.6  EOSABS 0.2  BASOSABS 0.0   ------------------------------------------------------------------------------------------------------------------  Chemistries   Recent Labs Lab 08/20/16 2245  NA 137  K 3.8  CL 102  CO2 25  GLUCOSE 191*  BUN 14  CREATININE 0.97  CALCIUM 9.9  AST 36  ALT 40  ALKPHOS 79  BILITOT 0.3   ------------------------------------------------------------------------------------------------------------------ estimated creatinine clearance is 54.4 mL/min (by C-G formula based on SCr of 0.97 mg/dL). ------------------------------------------------------------------------------------------------------------------ No results for input(s): TSH, T4TOTAL, T3FREE, THYROIDAB in the last 72 hours.  Invalid input(s): FREET3  Cardiac Enzymes  Recent Labs Lab 08/20/16 2245  TROPONINI <0.03    ------------------------------------------------------------------------------------------------------------------ Invalid input(s): POCBNP ---------------------------------------------------------------------------------------------------------------  RADIOLOGY: Ct Abdomen Pelvis W Contrast  Result Date: 08/20/2016 CLINICAL DATA:  Right flank to lower abdominal pain EXAM:  CT ABDOMEN AND PELVIS WITH CONTRAST TECHNIQUE: Multidetector CT imaging of the abdomen and pelvis was performed using the standard protocol following bolus administration of intravenous contrast. CONTRAST:  136mL ISOVUE-300 IOPAMIDOL (ISOVUE-300) INJECTION 61% COMPARISON:  12/01/2015 CT FINDINGS: Lower chest: Normal size cardiac chambers. No pericardial effusion. Bibasilar dependent atelectasis. Hepatobiliary: Cirrhotic appearing liver without space-occupying mass. No biliary dilatation. Status post cholecystectomy. Pancreas: Normal Spleen: Normal.  Small adjacent splenules are again seen. Adrenals/Urinary Tract: Normal bilateral adrenal glands and kidneys. No obstructive uropathy. Unremarkable appearance of the bladder. Stomach/Bowel: Physiologically distended stomach. Normal small bowel rotation without small-bowel obstruction or inflammation. Pericolonic inflammation at the junction of the descending and sigmoid colon consistent with mild diverticulitis. Circular muscle hypertrophy and diverticulosis along the mid and distal sigmoid. Normal appendix. Vascular/Lymphatic: No significant vascular findings are present. No enlarged abdominal or pelvic lymph nodes. Reproductive: Uterus and bilateral adnexa are unremarkable. Other: No pneumoperitoneum.  No abdominopelvic ascites. Musculoskeletal: Thoracolumbar spondylosis with disc-osteophyte complexes at L1-2 and L3-4. Associated lumbar facet arthropathy. IMPRESSION: 1. Mild diverticulitis at the junction of the descending and sigmoid colon without complication. 2. No obstructive uropathy.  3. Cirrhosis of the liver.  Status post cholecystectomy. Electronically Signed   By: Ashley Royalty M.D.   On: 08/20/2016 23:59    EKG:  Orders placed or performed during the hospital encounter of 08/20/16  . ED EKG  . ED EKG    ASSESSMENT AND PLAN:  Active Problems:   Acute diverticulitis  #1. Acute descending and sigmoid colon diverticulitis,, questionable  ischemic colitis, continue patient on Cipro and Flagyl, follow clinically, continue pain medications, IV fluids, clear liquid diet, advance diet as tolerated #2. Relative hypotension, hold blood pressure medications #3. Lactic acidosis, continue IV fluids, follow lactic acid level #4. Diabetes mellitus, continue outpatient medications, sliding scale insulin, blood glucose is ranging around 150  Management plans discussed with the patient, family and they are in agreement.   DRUG ALLERGIES:  Allergies  Allergen Reactions  . Tramadol Nausea And Vomiting  . Motrin [Ibuprofen] Rash    CODE STATUS:     Code Status Orders        Start     Ordered   08/21/16 0351  Full code  Continuous     08/21/16 0350    Code Status History    Date Active Date Inactive Code Status Order ID Comments User Context   This patient has a current code status but no historical code status.      TOTAL TIME TAKING CARE OF THIS PATIENT: 40 minutes.    Theodoro Grist M.D on 08/21/2016 at 1:30 PM  Between 7am to 6pm - Pager - [phone]  After 6pm go to [email or web address] - password [email or web address] [facility]  [place] [facility]  Office  [phone]  CC: Primary care physician; Ezequiel Kayser, MD

## 2016-08-21 NOTE — ED Notes (Signed)
Son, Lavonya Hoerner, [phone], called to update on pt's admission status

## 2016-08-21 NOTE — Care Management Obs Status (Signed)
[facility] [place] NOTIFICATION   Patient Details  Name: Alison Warren MRN: [id number] Date of Birth: May 05, 1945   Medicare Observation Status Notification Given:  Yes    Beverly Sessions, RN 08/21/2016, 3:21 PM

## 2016-08-22 LAB — GLUCOSE, CAPILLARY
GLUCOSE-CAPILLARY: 133 mg/dL — AB (ref 65–99)
Glucose-Capillary: 128 mg/dL — ABNORMAL HIGH (ref 65–99)
Glucose-Capillary: 177 mg/dL — ABNORMAL HIGH (ref 65–99)
Glucose-Capillary: 148 mg/dL — ABNORMAL HIGH (ref 65–99)

## 2016-08-22 MED ORDER — METRONIDAZOLE IN NACL 5-0.79 MG/ML-% IV SOLN
500.0000 mg | Freq: Three times a day (TID) | INTRAVENOUS | Status: DC
  Administered 2016-08-22 – 2016-08-23 (×3): 500 mg via INTRAVENOUS
  Filled 2016-08-22 (×4): qty 100

## 2016-08-22 MED ORDER — METRONIDAZOLE 500 MG PO TABS: 500.0000 mg | ORAL_TABLET | Freq: Three times a day (TID) | ORAL | Status: DC

## 2016-08-22 NOTE — Plan of Care (Signed)
Problem: Activity: Goal: Risk for activity intolerance will decrease Outcome: Progressing Patient is receptive to ambulating in hall way

## 2016-08-22 NOTE — Progress Notes (Addendum)
[facility] [place] NAME: Alison Warren    MR#:  [id number]  DATE OF BIRTH:  May 12, 1945  SUBJECTIVE:  CHIEF COMPLAINT:   Chief Complaint  Patient presents with  . Abdominal Pain   Patient is a 71 year old Caucasian female with past medical history significant for history of diverticular disease, diabetes, gastroesophageal reflux, hyperlipidemia, hypertension, arthritis, who presented to the hospital with complaints of lower abdominal pain, vomiting for the past 2 days, no hematemesis, hemoptysis or rectal bleed. CT scan of abdomen revealed diverticulitis, patient was admitted. She was initiated on ciprofloxacin and Flagyl intravenously. She feels Improved today, still complains of mild suprapubic abdominal pain, no diarrhea, would like to have her diet advanced   Review of Systems  Constitutional: Negative for chills, fever and weight loss.  HENT: Negative for congestion.   Eyes: Negative for blurred vision and double vision.  Respiratory: Negative for cough, sputum production, shortness of breath and wheezing.   Cardiovascular: Negative for chest pain, palpitations, orthopnea, leg swelling and PND.  Gastrointestinal: Positive for abdominal pain. Negative for blood in stool, constipation, diarrhea, nausea and vomiting.  Genitourinary: Negative for dysuria, frequency, hematuria and urgency.  Musculoskeletal: Negative for falls.  Neurological: Negative for dizziness, tremors, focal weakness and headaches.  Endo/Heme/Allergies: Does not bruise/bleed easily.  Psychiatric/Behavioral: Negative for depression. The patient does not have insomnia.     VITAL SIGNS: Blood pressure 139/83, pulse 64, temperature 97.5 F (36.4 C), temperature source Oral, resp. rate 17, height 5\' 4"  (1.626 m), weight 82.1 kg (181 lb), SpO2 98 %.  PHYSICAL EXAMINATION:   GENERAL:  71 y.o.-year-old patient lying in the bed with no acute distress.  EYES:  Pupils equal, round, reactive to light and accommodation. No scleral icterus. Extraocular muscles intact.  HEENT: Head atraumatic, normocephalic. Oropharynx and nasopharynx clear.  NECK:  Supple, no jugular venous distention. No thyroid enlargement, no tenderness.  LUNGS: Normal breath sounds bilaterally, no wheezing, rales,rhonchi or crepitation. No use of accessory muscles of respiration.  CARDIOVASCULAR: S1, S2 normal. No murmurs, rubs, or gallops.  ABDOMEN: Soft, mild tenderness in the lower abdomen, suprapubic, but no rebound, guarding was noted, nondistended. Bowel sounds present. No organomegaly or mass.  EXTREMITIES: No pedal edema, cyanosis, or clubbing.  NEUROLOGIC: Cranial nerves II through XII are intact. Muscle strength 5/5 in all extremities. Sensation intact. Gait not checked.  PSYCHIATRIC: The patient is alert and oriented x 3.  SKIN: No obvious rash, lesion, or ulcer.   ORDERS/RESULTS REVIEWED:   CBC  Recent Labs Lab 08/20/16 2245  WBC 8.8  HGB 14.8  HCT 43.6  PLT 254  MCV 85.7  MCH 29.1  MCHC 33.9  RDW 14.2  LYMPHSABS 3.7*  MONOABS 0.6  EOSABS 0.2  BASOSABS 0.0   ------------------------------------------------------------------------------------------------------------------  Chemistries   Recent Labs Lab 08/20/16 2245  NA 137  K 3.8  CL 102  CO2 25  GLUCOSE 191*  BUN 14  CREATININE 0.97  CALCIUM 9.9  AST 36  ALT 40  ALKPHOS 79  BILITOT 0.3   ------------------------------------------------------------------------------------------------------------------ estimated creatinine clearance is 54.4 mL/min (by C-G formula based on SCr of 0.97 mg/dL). ------------------------------------------------------------------------------------------------------------------ No results for input(s): TSH, T4TOTAL, T3FREE, THYROIDAB in the last 72 hours.  Invalid input(s): FREET3  Cardiac Enzymes  Recent Labs Lab 08/20/16 2245  TROPONINI <0.03    ------------------------------------------------------------------------------------------------------------------ Invalid input(s): POCBNP ---------------------------------------------------------------------------------------------------------------  RADIOLOGY: Ct Abdomen Pelvis W Contrast  Result Date: 08/20/2016 CLINICAL DATA:  Right flank to  lower abdominal pain EXAM: CT ABDOMEN AND PELVIS WITH CONTRAST TECHNIQUE: Multidetector CT imaging of the abdomen and pelvis was performed using the standard protocol following bolus administration of intravenous contrast. CONTRAST:  116mL ISOVUE-300 IOPAMIDOL (ISOVUE-300) INJECTION 61% COMPARISON:  12/01/2015 CT FINDINGS: Lower chest: Normal size cardiac chambers. No pericardial effusion. Bibasilar dependent atelectasis. Hepatobiliary: Cirrhotic appearing liver without space-occupying mass. No biliary dilatation. Status post cholecystectomy. Pancreas: Normal Spleen: Normal.  Small adjacent splenules are again seen. Adrenals/Urinary Tract: Normal bilateral adrenal glands and kidneys. No obstructive uropathy. Unremarkable appearance of the bladder. Stomach/Bowel: Physiologically distended stomach. Normal small bowel rotation without small-bowel obstruction or inflammation. Pericolonic inflammation at the junction of the descending and sigmoid colon consistent with mild diverticulitis. Circular muscle hypertrophy and diverticulosis along the mid and distal sigmoid. Normal appendix. Vascular/Lymphatic: No significant vascular findings are present. No enlarged abdominal or pelvic lymph nodes. Reproductive: Uterus and bilateral adnexa are unremarkable. Other: No pneumoperitoneum.  No abdominopelvic ascites. Musculoskeletal: Thoracolumbar spondylosis with disc-osteophyte complexes at L1-2 and L3-4. Associated lumbar facet arthropathy. IMPRESSION: 1. Mild diverticulitis at the junction of the descending and sigmoid colon without complication. 2. No obstructive uropathy.  3. Cirrhosis of the liver.  Status post cholecystectomy. Electronically Signed   By: Ashley Royalty M.D.   On: 08/20/2016 23:59    EKG:  Orders placed or performed during the hospital encounter of 08/20/16  . ED EKG  . ED EKG    ASSESSMENT AND PLAN:  Active Problems:   Acute diverticulitis  #1. Acute descending and sigmoid colon diverticulitis,, ? ischemic colitis, continue patient on Cipro and Flagyl, Improved clinically, continue pain medications, IV fluids, advance diet to follow liquid diet #2. Relative hypotension, hold blood pressure medications #3. Lactic acidosis, continue IV fluids, improving lactic acid level #4. Diabetes mellitus, continue outpatient medications, sliding scale insulin, blood glucose is ranging around 130-150, follow with advanced diet  Management plans discussed with the patient, family and they are in agreement.   DRUG ALLERGIES:  Allergies  Allergen Reactions  . Tramadol Nausea And Vomiting  . Motrin [Ibuprofen] Rash    CODE STATUS:     Code Status Orders        Start     Ordered   08/21/16 0351  Full code  Continuous     08/21/16 0350    Code Status History    Date Active Date Inactive Code Status Order ID Comments User Context   This patient has a current code status but no historical code status.      TOTAL TIME TAKING CARE OF THIS PATIENT: 30 minutes.    Theodoro Grist M.D on 08/22/2016 at 3:24 PM  Between 7am to 6pm - Pager - [phone]  After 6pm go to [email or web address] - password [email or web address] [facility]  [place] [facility]  Office  [phone]  CC: Primary care physician; Ezequiel Kayser, MD

## 2016-08-23 LAB — CBC
HCT: 42.4 % (ref 35.0–47.0)
Hemoglobin: 14.4 g/dL (ref 12.0–16.0)
MCH: 29.1 pg (ref 26.0–34.0)
MCHC: 33.9 g/dL (ref 32.0–36.0)
MCV: 85.7 fL (ref 80.0–100.0)
PLATELETS: 228 10*3/uL (ref 150–440)
RBC: 4.95 MIL/uL (ref 3.80–5.20)
RDW: 14.3 % (ref 11.5–14.5)
WBC: 5.6 10*3/uL (ref 3.6–11.0)

## 2016-08-23 LAB — GLUCOSE, CAPILLARY
GLUCOSE-CAPILLARY: 177 mg/dL — AB (ref 65–99)
GLUCOSE-CAPILLARY: 182 mg/dL — AB (ref 65–99)
Glucose-Capillary: 150 mg/dL — ABNORMAL HIGH (ref 65–99)
Glucose-Capillary: 162 mg/dL — ABNORMAL HIGH (ref 65–99)

## 2016-08-23 LAB — CREATININE, SERUM
Creatinine, Ser: 0.76 mg/dL (ref 0.44–1.00)
GFR calc Af Amer: >60 mL/min (ref >60)
GFR calc non Af Amer: >60 mL/min (ref >60)

## 2016-08-23 MED ORDER — METRONIDAZOLE 500 MG PO TABS
500.0000 mg | ORAL_TABLET | Freq: Three times a day (TID) | ORAL | Status: DC
  Administered 2016-08-23 – 2016-08-24 (×2): 500 mg via ORAL
  Filled 2016-08-23 (×2): qty 1

## 2016-08-23 MED ORDER — METRONIDAZOLE 500 MG PO TABS: 500.0000 mg | ORAL_TABLET | Freq: Three times a day (TID) | ORAL | Status: DC

## 2016-08-23 MED ORDER — CIPROFLOXACIN HCL 500 MG PO TABS
500.0000 mg | ORAL_TABLET | Freq: Two times a day (BID) | ORAL | Status: DC
  Administered 2016-08-23 – 2016-08-24 (×2): 500 mg via ORAL
  Filled 2016-08-23 (×2): qty 1

## 2016-08-23 NOTE — Progress Notes (Signed)
[place] [facility] [place] NAME: Alison Warren    MR#:  [id number]  DATE OF BIRTH:  02-05-1946  SUBJECTIVE:  CHIEF COMPLAINT:   Chief Complaint  Patient presents with  . Abdominal Pain   Patient is a 71 year old Caucasian female with past medical history significant for history of diverticular disease, diabetes, gastroesophageal reflux, hyperlipidemia, hypertension, arthritis, who presented to the hospital with complaints of lower abdominal pain, vomiting for the past 2 days, no hematemesis, hemoptysis or rectal bleed. CT scan of abdomen revealed diverticulitis, patient was admitted. She was initiated on ciprofloxacin and Flagyl intravenously. She feels Some better, however, still has some abdominal pain, nausea intermittently, not eating much despite advancement of her diet.   Review of Systems  Constitutional: Negative for chills, fever and weight loss.  HENT: Negative for congestion.   Eyes: Negative for blurred vision and double vision.  Respiratory: Negative for cough, sputum production, shortness of breath and wheezing.   Cardiovascular: Negative for chest pain, palpitations, orthopnea, leg swelling and PND.  Gastrointestinal: Positive for abdominal pain. Negative for blood in stool, constipation, diarrhea, nausea and vomiting.  Genitourinary: Negative for dysuria, frequency, hematuria and urgency.  Musculoskeletal: Negative for falls.  Neurological: Negative for dizziness, tremors, focal weakness and headaches.  Endo/Heme/Allergies: Does not bruise/bleed easily.  Psychiatric/Behavioral: Negative for depression. The patient does not have insomnia.     VITAL SIGNS: Blood pressure (!) 154/68, pulse 62, temperature 98.3 F (36.8 C), temperature source Oral, resp. rate 16, height 5\' 4"  (1.626 m), weight 82.1 kg (181 lb), SpO2 96 %.  PHYSICAL EXAMINATION:   GENERAL:  71 y.o.-year-old patient lying in the bed with no acute distress.    EYES: Pupils equal, round, reactive to light and accommodation. No scleral icterus. Extraocular muscles intact.  HEENT: Head atraumatic, normocephalic. Oropharynx and nasopharynx clear.  NECK:  Supple, no jugular venous distention. No thyroid enlargement, no tenderness.  LUNGS: Normal breath sounds bilaterally, no wheezing, rales,rhonchi or crepitation. No use of accessory muscles of respiration.  CARDIOVASCULAR: S1, S2 normal. No murmurs, rubs, or gallops.  ABDOMEN: Soft, mild tenderness in the lower abdomen, suprapubic, but no rebound, guarding was noted, nondistended. Bowel sounds present. No organomegaly or mass.  EXTREMITIES: No pedal edema, cyanosis, or clubbing.  NEUROLOGIC: Cranial nerves II through XII are intact. Muscle strength 5/5 in all extremities. Sensation intact. Gait not checked.  PSYCHIATRIC: The patient is alert and oriented x 3.  SKIN: No obvious rash, lesion, or ulcer.   ORDERS/RESULTS REVIEWED:   CBC  Recent Labs Lab 08/20/16 2245 08/23/16 0524  WBC 8.8 5.6  HGB 14.8 14.4  HCT 43.6 42.4  PLT 254 228  MCV 85.7 85.7  MCH 29.1 29.1  MCHC 33.9 33.9  RDW 14.2 14.3  LYMPHSABS 3.7*  --   MONOABS 0.6  --   EOSABS 0.2  --   BASOSABS 0.0  --    ------------------------------------------------------------------------------------------------------------------  Chemistries   Recent Labs Lab 08/20/16 2245 08/23/16 0524  NA 137  --   K 3.8  --   CL 102  --   CO2 25  --   GLUCOSE 191*  --   BUN 14  --   CREATININE 0.97 0.76  CALCIUM 9.9  --   AST 36  --   ALT 40  --   ALKPHOS 79  --   BILITOT 0.3  --    ------------------------------------------------------------------------------------------------------------------ estimated creatinine clearance is 65.9 mL/min (by C-G formula based  on SCr of 0.76 mg/dL). ------------------------------------------------------------------------------------------------------------------ No results for input(s): TSH,  T4TOTAL, T3FREE, THYROIDAB in the last 72 hours.  Invalid input(s): FREET3  Cardiac Enzymes  Recent Labs Lab 08/20/16 2245  TROPONINI <0.03   ------------------------------------------------------------------------------------------------------------------ Invalid input(s): POCBNP ---------------------------------------------------------------------------------------------------------------  RADIOLOGY: No results found.  EKG:  Orders placed or performed during the hospital encounter of 08/20/16  . ED EKG  . ED EKG    ASSESSMENT AND PLAN:  Active Problems:   Acute diverticulitis  #1. Acute descending and sigmoid colon diverticulitis,, continue patient on Cipro and Flagyl, Improved clinically, continue pain medications, IV fluids, advance diet To soft diet, follow oral intake #2. Relative hypotension, resolved on IV fluids #3. Lactic acidosis,  improved on IV fluids, decrease IV fluid rate  #4. Diabetes mellitus, continue outpatient medications, sliding scale insulin, blood glucose is ranging around 130-150, follow with advanced diet, oral intake remains poor  Management plans discussed with the patient, family and they are in agreement.   DRUG ALLERGIES:  Allergies  Allergen Reactions  . Tramadol Nausea And Vomiting  . Motrin [Ibuprofen] Rash    CODE STATUS:     Code Status Orders        Start     Ordered   08/21/16 0351  Full code  Continuous     08/21/16 0350    Code Status History    Date Active Date Inactive Code Status Order ID Comments User Context   This patient has a current code status but no historical code status.      TOTAL TIME TAKING CARE OF THIS PATIENT: 30 minutes.    Theodoro Grist M.D on 08/23/2016 at 3:06 PM  Between 7am to 6pm - Pager - [phone]  After 6pm go to [email or web address] - password [email or web address] [facility]  [place] [facility]  Office  [phone]  CC: Primary care physician; Ezequiel Kayser, MD

## 2016-08-23 NOTE — Progress Notes (Signed)
PHARMACIST - PHYSICIAN COMMUNICATION DR:   Ether Griffins CONCERNING: Antibiotic IV to Oral Route Change Policy  RECOMMENDATION: This patient is receiving ciprofloxacin and metronidazole by the intravenous route.  Based on criteria approved by the Pharmacy and Therapeutics Committee, the antibiotic(s) is/are being converted to the equivalent oral dose form(s).   DESCRIPTION: These criteria include:  Patient being treated for a respiratory tract infection, urinary tract infection, cellulitis or clostridium difficile associated diarrhea if on metronidazole  The patient is not neutropenic and does not exhibit a GI malabsorption state  The patient is eating (either orally or via tube) and/or has been taking other orally administered medications for a least 24 hours  The patient is improving clinically and has a Tmax < 100.5  If you have questions about this conversion, please contact the [place], PharmD 08/23/16 11:11 AM

## 2016-08-24 DIAGNOSIS — E872 Acidosis, unspecified: Secondary | ICD-10-CM

## 2016-08-24 DIAGNOSIS — I959 Hypotension, unspecified: Secondary | ICD-10-CM

## 2016-08-24 DIAGNOSIS — E119 Type 2 diabetes mellitus without complications: Secondary | ICD-10-CM

## 2016-08-24 DIAGNOSIS — E118 Type 2 diabetes mellitus with unspecified complications: Secondary | ICD-10-CM

## 2016-08-24 HISTORY — DX: Acidosis, unspecified: E87.20

## 2016-08-24 HISTORY — DX: Acidosis: E87.2

## 2016-08-24 LAB — GLUCOSE, CAPILLARY: GLUCOSE-CAPILLARY: 144 mg/dL — AB (ref 65–99)

## 2016-08-24 MED ORDER — DOCUSATE SODIUM 100 MG PO CAPS
100.0000 mg | ORAL_CAPSULE | Freq: Two times a day (BID) | ORAL | Status: DC
  Administered 2016-08-24: 100 mg via ORAL
  Filled 2016-08-24: qty 1

## 2016-08-24 MED ORDER — METRONIDAZOLE 500 MG PO TABS: 500.0000 mg | ORAL_TABLET | Freq: Three times a day (TID) | ORAL | 0 refills | Status: DC

## 2016-08-24 MED ORDER — DOCUSATE SODIUM 100 MG PO CAPS: 100.0000 mg | ORAL_CAPSULE | Freq: Two times a day (BID) | ORAL | 0 refills | Status: DC

## 2016-08-24 MED ORDER — CIPROFLOXACIN HCL 500 MG PO TABS: 500.0000 mg | ORAL_TABLET | Freq: Two times a day (BID) | ORAL | 0 refills | Status: DC

## 2016-08-24 MED ORDER — SENNA 8.6 MG PO TABS: 1.0000 | ORAL_TABLET | Freq: Every evening | ORAL | 0 refills | Status: DC | PRN

## 2016-08-24 NOTE — Discharge Instructions (Signed)

## 2016-08-24 NOTE — Discharge Summary (Signed)
[facility] NAME: Cicely Ortner    MR#:  [id number]  DATE OF BIRTH:  [birth year]  DATE OF ADMISSION:  08/20/2016 ADMITTING PHYSICIAN: Saundra Shelling, MD  DATE OF DISCHARGE: 08/24/2016 12:10 PM  PRIMARY CARE PHYSICIAN: Ezequiel Kayser, MD     ADMISSION DIAGNOSIS:  Diverticulitis [K57.92]  DISCHARGE DIAGNOSIS:  Active Problems:   Acute diverticulitis   Hypotension   Lactic acidosis   Diabetes ([place])   SECONDARY DIAGNOSIS:   Past Medical History:  Diagnosis Date  . Arthritis   . Bronchitis    none currently  . Chronic cough   . Costochondritis   . Diabetes mellitus without complication ([place])   . Diverticulitis   . GERD (gastroesophageal reflux disease)   . Hypercholesteremia   . Hypertension   . Wears hearing aid    right ear    .pro HOSPITAL COURSE:   Patient is a 71 year old Caucasian female with past medical history significant for history of diverticular disease, diabetes, gastroesophageal reflux, hyperlipidemia, hypertension, arthritis, who presented to the hospital with complaints of lower abdominal pain, vomiting for 2 days, no hematemesis, hemoptysis or rectal bleed. CT scan of abdomen revealed diverticulitis, patient was admitted. She was initiated on ciprofloxacin and Flagyl intravenouslyAnd clinically improved. Her diet was advanced and she was able to tolerated well. She was advised to continue antibiotic therapy for the next few days to complete course and follow-up with her primary care physician as well as gastroenterologist as outpatient. Discussion by problem: #1. Acute descending and sigmoid colon diverticulitis,, continue patient on Cipro and Flagyl to complete course, Improved clinically, advance diet as tolerated, follow-up with primary care physician and gastroenterologist as outpatient for colonoscopy as outpatient #2. Relative hypotension, resolved on IV fluids, blood pressure medications  were resumed #3. Lactic acidosis,   resolved on IV fluids #4. Diabetes mellitus, continue outpatient medications, sliding scale insulin, blood glucose is ranging around 130-150, follow with advanced diet, oral intake remains poor DISCHARGE CONDITIONS:   Stable  CONSULTS OBTAINED:    DRUG ALLERGIES:   Allergies  Allergen Reactions  . Tramadol Nausea And Vomiting  . Motrin [Ibuprofen] Rash    DISCHARGE MEDICATIONS:   Discharge Medication List as of 08/24/2016 11:16 AM    START taking these medications   Details  docusate sodium (COLACE) 100 MG capsule Take 1 capsule (100 mg total) by mouth 2 (two) times daily., Starting Thu 08/24/2016, Normal    senna (SENOKOT) 8.6 MG TABS tablet Take 1 tablet (8.6 mg total) by mouth at bedtime as needed for mild constipation., Starting Thu 08/24/2016, Normal      CONTINUE these medications which have CHANGED   Details  ciprofloxacin (CIPRO) 500 MG tablet Take 1 tablet (500 mg total) by mouth 2 (two) times daily., Starting Thu 08/24/2016, Normal    metroNIDAZOLE (FLAGYL) 500 MG tablet Take 1 tablet (500 mg total) by mouth every 8 (eight) hours., Starting Thu 08/24/2016, Normal      CONTINUE these medications which have NOT CHANGED   Details  acetaminophen (TYLENOL) 500 MG tablet Take 500 mg by mouth every 6 (six) hours as needed for moderate pain, fever or headache. , Historical Med    amLODipine (NORVASC) 5 MG tablet Take 5 mg by mouth daily. AM, Historical Med    aspirin 81 MG tablet Take 81 mg by mouth daily. AM, Historical Med    carvedilol (COREG) 3.125 MG tablet Take 6.25 mg by mouth daily.,  Historical Med    metFORMIN (GLUCOPHAGE-XR) 500 MG 24 hr tablet Take 1,500 mg by mouth daily., Historical Med    omeprazole (PRILOSEC) 40 MG capsule Take 40 mg by mouth daily., Historical Med    benzonatate (TESSALON) 200 MG capsule Take 200 mg by mouth 3 (three) times daily as needed for cough., Historical Med    diazepam (VALIUM) 2 MG tablet  Take 1 tablet (2 mg total) by mouth every 6 (six) hours as needed for anxiety., Starting Sun 06/04/2016, Print    lidocaine (LIDODERM) 5 % Place 1 patch onto the skin every 12 (twelve) hours. Remove & Discard patch within 12 hours or as directed by MD, Starting Sun 06/04/2016, Until Mon 06/04/2017, Print    magnesium oxide (MAG-OX) 400 MG tablet Take 400 mg by mouth daily. AM, Historical Med    ondansetron (ZOFRAN ODT) 4 MG disintegrating tablet Take 1 tablet (4 mg total) by mouth every 8 (eight) hours as needed for nausea or vomiting., Starting Wed 12/01/2015, Print    ondansetron (ZOFRAN) 4 MG tablet Take 1 tablet (4 mg total) by mouth daily as needed for nausea or vomiting., Starting Wed 12/22/2015, Print    !! oxyCODONE-acetaminophen (PERCOCET) 5-325 MG tablet Take 1-2 tablets by mouth every 6 (six) hours as needed for moderate pain or severe pain., Starting Wed 12/22/2015, Print    !! oxyCODONE-acetaminophen (ROXICET) 5-325 MG tablet Take 1 tablet by mouth every 6 (six) hours as needed., Starting Sun 06/04/2016, Print    sucralfate (CARAFATE) 1 g tablet Take 1 tablet (1 g total) by mouth 4 (four) times daily., Starting Wed 12/22/2015, Until Thu 12/21/2016, Print     !! - Potential duplicate medications found. Please discuss with provider.    STOP taking these medications     doxycycline (VIBRA-TABS) 100 MG tablet          DISCHARGE INSTRUCTIONS:    The patient is to follow-up with primary care physician within one week after discharge  If you experience worsening of your admission symptoms, develop shortness of breath, life threatening emergency, suicidal or homicidal thoughts you must seek medical attention immediately by calling 911 or calling your MD immediately  if symptoms less severe.  You Must read complete instructions/literature along with all the possible adverse reactions/side effects for all the Medicines you take and that have been prescribed to you. Take any new Medicines  after you have completely understood and accept all the possible adverse reactions/side effects.   Please note  You were cared for by a hospitalist during your hospital stay. If you have any questions about your discharge medications or the care you received while you were in the hospital after you are discharged, you can call the unit and asked to speak with the hospitalist on call if the hospitalist that took care of you is not available. Once you are discharged, your primary care physician will handle any further medical issues. Please note that NO REFILLS for any discharge medications will be authorized once you are discharged, as it is imperative that you return to your primary care physician (or establish a relationship with a primary care physician if you do not have one) for your aftercare needs so that they can reassess your need for medications and monitor your lab values.    Today   CHIEF COMPLAINT:   Chief Complaint  Patient presents with  . Abdominal Pain    HISTORY OF PRESENT ILLNESS:    VITAL SIGNS:  Blood pressure 127/79,  pulse 73, temperature 98 F (36.7 C), temperature source Oral, resp. rate 14, height 5\' 4"  (1.626 m), weight 82.1 kg (181 lb), SpO2 97 %.  I/O:   Intake/Output Summary (Last 24 hours) at 08/24/16 1548 Last data filed at 08/24/16 0804  Gross per 24 hour  Intake             1115 ml  Output                0 ml  Net             1115 ml    PHYSICAL EXAMINATION:  GENERAL:  71 y.o.-year-old patient lying in the bed with no acute distress.  EYES: Pupils equal, round, reactive to light and accommodation. No scleral icterus. Extraocular muscles intact.  HEENT: Head atraumatic, normocephalic. Oropharynx and nasopharynx clear.  NECK:  Supple, no jugular venous distention. No thyroid enlargement, no tenderness.  LUNGS: Normal breath sounds bilaterally, no wheezing, rales,rhonchi or crepitation. No use of accessory muscles of respiration.  CARDIOVASCULAR:  S1, S2 normal. No murmurs, rubs, or gallops.  ABDOMEN: Soft, non-tender, non-distended. Bowel sounds present. No organomegaly or mass.  EXTREMITIES: No pedal edema, cyanosis, or clubbing.  NEUROLOGIC: Cranial nerves II through XII are intact. Muscle strength 5/5 in all extremities. Sensation intact. Gait not checked.  PSYCHIATRIC: The patient is alert and oriented x 3.  SKIN: No obvious rash, lesion, or ulcer.   DATA REVIEW:   CBC  Recent Labs Lab 08/23/16 0524  WBC 5.6  HGB 14.4  HCT 42.4  PLT 228    Chemistries   Recent Labs Lab 08/20/16 2245 08/23/16 0524  NA 137  --   K 3.8  --   CL 102  --   CO2 25  --   GLUCOSE 191*  --   BUN 14  --   CREATININE 0.97 0.76  CALCIUM 9.9  --   AST 36  --   ALT 40  --   ALKPHOS 79  --   BILITOT 0.3  --     Cardiac Enzymes  Recent Labs Lab 08/20/16 2245  TROPONINI <0.03    Microbiology Results  Results for orders placed or performed in visit on 09/06/13  Culture, blood (single)     Status: None   Collection Time: 09/06/13  4:03 AM  Result Value Ref Range Status   Micro Text Report   Final       COMMENT                   NO GROWTH AEROBICALLY/ANAEROBICALLY IN 5 DAYS   ANTIBIOTIC                                                      Influenza A&B Antigens [facility])     Status: None   Collection Time: 09/06/13  4:05 AM  Result Value Ref Range Status   Micro Text Report   Final       COMMENT                   NEGATIVE FOR INFLUENZA A (ANTIGEN ABSENT)   COMMENT                   NEGATIVE FOR INFLUENZA B (ANTIGEN ABSENT)   ANTIBIOTIC  Culture, blood (single)     Status: None   Collection Time: 09/06/13  4:17 AM  Result Value Ref Range Status   Micro Text Report   Final       COMMENT                   NO GROWTH AEROBICALLY/ANAEROBICALLY IN 5 DAYS   ANTIBIOTIC                                                        RADIOLOGY:  No results found.  EKG:    Orders placed or performed during the hospital encounter of 08/20/16  . ED EKG  . ED EKG      Management plans discussed with the patient, family and they are in agreement.  CODE STATUS:  Code Status History    Date Active Date Inactive Code Status Order ID Comments User Context   08/21/2016  3:50 AM 08/24/2016  3:15 PM Full Code [id number]  Saundra Shelling, MD Inpatient      TOTAL TIME TAKING CARE OF THIS PATIENT: 40 minutes.    Theodoro Grist M.D on 08/24/2016 at 3:48 PM  Between 7am to 6pm - Pager - [phone]  After 6pm go to [email or web address] - password [email or web address] [facility]  [facility]  Office  [phone]  CC: Primary care physician; Ezequiel Kayser, MD

## 2016-09-01 ENCOUNTER — Encounter: Payer: Self-pay | Admitting: General Surgery

## 2016-09-01 ENCOUNTER — Other Ambulatory Visit: Payer: Self-pay | Admitting: Gastroenterology

## 2016-09-01 DIAGNOSIS — R933 Abnormal findings on diagnostic imaging of other parts of digestive tract: Secondary | ICD-10-CM

## 2016-09-01 DIAGNOSIS — E559 Vitamin D deficiency, unspecified: Secondary | ICD-10-CM | POA: Insufficient documentation

## 2016-09-20 ENCOUNTER — Ambulatory Visit (INDEPENDENT_AMBULATORY_CARE_PROVIDER_SITE_OTHER): Payer: Medicare Other | Admitting: General Surgery

## 2016-09-20 ENCOUNTER — Encounter: Payer: Self-pay | Admitting: General Surgery

## 2016-09-20 VITALS — BP 142/72 | HR 94 | Resp 12 | Ht 64.0 in | Wt 172.0 lb

## 2016-09-20 DIAGNOSIS — K5732 Diverticulitis of large intestine without perforation or abscess without bleeding: Secondary | ICD-10-CM | POA: Diagnosis not present

## 2016-09-20 MED ORDER — POLYETHYLENE GLYCOL 3350 17 GM/SCOOP PO POWD: 1.0000 | Freq: Every day | ORAL | 0 refills | Status: DC

## 2016-09-20 NOTE — Patient Instructions (Addendum)
Use  Miralax daily for 6 weeks one capful a day.

## 2016-09-20 NOTE — Progress Notes (Signed)
Patient ID: Alison Warren, female   DOB: March 30, 1946, 71 y.o.   MRN: [id number]  Chief Complaint  Patient presents with  . Abdominal Pain    HPI Alison Warren is a 71 y.o. female is here today for abdominal pain and diverticulitis. She states the pain, nausea and vomitting and dark black stools started on 08/18/16 it lasted 2 days and she went to the ER on 08/20/16, She was discharged 08/24/16.  The pain was located in her lower right abdominal and left abdominal quadrant. The pain is described as a sharp pain.  She denies nausea, fever. Reports her bowels are back to normal-denies diarrhea and constipation. She had a CT done on 08/20/16. She reports a little tenderness today-not as bad as it was.  She did have an episode of abdominal pain in 01/2016 after which she had a colonoscopy.  Reports 3 previous episodes with abdominal pain in which she had to call rescue. Each time the pain was about the same. The last episode was the worst. (Getting a clear picture of the frequency of her episodic pain was difficult.)  HPI  Past Medical History:  Diagnosis Date  . Arthritis   . Bronchitis    none currently  . Chronic cough   . Costochondritis   . Diabetes mellitus without complication ([place])    8-9 years  . Diverticulitis   . Diverticulitis 08/2016  . GERD (gastroesophageal reflux disease)   . Hypercholesteremia   . Hypertension   . Wears hearing aid    right ear    Past Surgical History:  Procedure Laterality Date  . CATARACT EXTRACTION W/ INTRAOCULAR LENS IMPLANT    . CATARACT EXTRACTION W/PHACO Right 12/07/2014   Procedure: CATARACT EXTRACTION PHACO AND INTRAOCULAR LENS PLACEMENT ([place]);  Surgeon: Ronnell Freshwater, MD;  Location: [facility];  Service: Ophthalmology;  Laterality: Right;  DIABETIC - oral meds  . CHOLECYSTECTOMY  2007  . COLONOSCOPY N/A 02/08/2016   Procedure: COLONOSCOPY;  Surgeon: Lollie Sails, MD;  Location: [facility] ENDOSCOPY;  Service: Endoscopy;   Laterality: N/A;  Diabetes  . MASTOIDECTOMY    . THUMB AMPUTATION     removal of 2nd thumb  . TONSILLECTOMY      Family History  Problem Relation Age of Onset  . Cancer Mother 30       Cervical  . Heart attack Father   . Diabetes Neg Hx   . COPD Neg Hx     Social History Social History  Substance Use Topics  . Smoking status: Never Smoker  . Smokeless tobacco: Never Used  . Alcohol use No    Allergies  Allergen Reactions  . Tramadol Nausea And Vomiting  . Motrin [Ibuprofen] Rash    Current Outpatient Prescriptions  Medication Sig Dispense Refill  . acetaminophen (TYLENOL) 500 MG tablet Take 500 mg by mouth every 6 (six) hours as needed for moderate pain, fever or headache.     Marland Kitchen amLODipine (NORVASC) 5 MG tablet Take 5 mg by mouth daily. AM    . aspirin 81 MG tablet Take 81 mg by mouth daily. AM    . carvedilol (COREG) 3.125 MG tablet Take 6.25 mg by mouth 2 (two) times daily with a meal.     . docusate sodium (COLACE) 100 MG capsule Take 1 capsule (100 mg total) by mouth 2 (two) times daily. 10 capsule 0  . metFORMIN (GLUCOPHAGE-XR) 500 MG 24 hr tablet Take 1,500 mg by mouth daily.    Marland Kitchen  omeprazole (PRILOSEC) 40 MG capsule Take 40 mg by mouth daily.    . polyethylene glycol powder (GLYCOLAX/MIRALAX) powder Take 255 g by mouth daily. One capful daily 255 g 0   No current facility-administered medications for this visit.     Review of Systems Review of Systems  Constitutional: Negative.   Respiratory: Negative.   Cardiovascular: Negative.     Blood pressure (!) 142/72, pulse 94, resp. rate 12, height 5\' 4"  (1.626 m), weight 172 lb (78 kg).  Physical Exam Physical Exam  Constitutional: She is oriented to person, place, and time. She appears well-developed and well-nourished.  Eyes: Conjunctivae are normal. No scleral icterus.  Neck: Neck supple. No thyromegaly present.  Cardiovascular: Normal rate, regular rhythm, normal heart sounds and intact distal pulses.    Pulmonary/Chest: Effort normal and breath sounds normal.  Abdominal: Soft. Bowel sounds are normal. She exhibits no distension.  Well healed scars from previous cholecystectomy   Neurological: She is alert and oriented to person, place, and time.  Skin: Skin is warm and dry.    Data Reviewed CT scans of the abdomen and pelvis completed between May 2018 in March 2007 were reviewed.  Sigmoid diverticulitis noted on the most recent study, mild without complication. Similar reports dating back to 2007.  No evidence of perforation, abscess, severe inflammatory changes on review of multiple scans.  July 2017 exam raised question of an abnormality for which an MRI of the liver was obtained with a subtle portal venous phase enhancement noted in the right lobe. 6-12 month follow-up recommended (scheduled for 10/18/2016).  Hospital records from Aug 20, 2016 admission reviewed. The patient presented with mild hypotension and elevated serum lactate both of which resolved with IV fluids. Treated with IV Cipro and Flagyl with rapid resolution of symptoms.  Laboratory studies obtained at the time of her 08/20/2016 admission showed a white blood cell count of 8800 with 49% polys, hemoglobin 14.8, normal platelet count.    Laboratory studies obtained during her 12/10/2015 emergency department evaluation for diverticulitis showed a white blood cell count of 8600 with 62% polys, 20% lymphocytes and hemoglobin 15.7.  02/08/2016 colonoscopy described a 3 mm polyp in the proximal transverse colon. Multiple small largemouth diverticuli in the sigmoid and descending colon. Internal hemorrhoids.  Pathology of 02/08/2016 described a tubular adenoma without dysplasia.    Assessment    Long history of intermittent episodes of diverticulitis, cluster of symptoms the last few months.  Laboratory suggesting inflammatory rather than infectious source for her episodic pain.    Plan    This patient seems to be  presenting more within picture of intermittent inflammation rather infection as the source for her lower abdominal pain. The difficulty of completing exam raised some questions about her general functional status and ability to tolerate major colonic surgery. As there has been no evidence of perforation or abscess formation in the past, the patient is unlikely to develop those symptoms in the future.  Prior to plunging he had with a colon resection I think that an effort to maintain more regular bowel habits is appropriate.  She's been asked to use one capsule of MiraLAX in 8 ounces of fluid daily to see if this allows for more regular elimination and resolution of her episodic abdominal pain.     Patient to use Miralax daily for 6 weeks.   HPI, Physical Exam, Assessment and Plan have been scribed under the direction and in the presence of Hervey Ard, MD.  Verlene Mayer,  CMA  I have completed the exam and reviewed the above documentation for accuracy and completeness.  I agree with the above.  Haematologist has been used and any errors in dictation or transcription are unintentional.  Hervey Ard, M.D., F.A.C.S.    Robert Bellow 09/22/2016, 7:34 AM

## 2016-09-21 ENCOUNTER — Encounter: Payer: Self-pay | Admitting: *Deleted

## 2016-09-22 DIAGNOSIS — K5732 Diverticulitis of large intestine without perforation or abscess without bleeding: Secondary | ICD-10-CM | POA: Insufficient documentation

## 2016-10-18 ENCOUNTER — Ambulatory Visit
Admission: RE | Admit: 2016-10-18 | Discharge: 2016-10-18 | Disposition: A | Discharge status: Home / Self Care | Payer: Medicare Other | Source: Ambulatory Visit | Attending: Gastroenterology | Admitting: Gastroenterology

## 2016-10-18 ENCOUNTER — Other Ambulatory Visit
Admission: RE | Admit: 2016-10-18 | Discharge: 2016-10-18 | Disposition: A | Discharge status: Home / Self Care | Payer: Medicare Other | Source: Ambulatory Visit | Attending: Gastroenterology | Admitting: Gastroenterology

## 2016-10-18 ENCOUNTER — Other Ambulatory Visit: Payer: Self-pay | Admitting: Gastroenterology

## 2016-10-18 DIAGNOSIS — K7460 Unspecified cirrhosis of liver: Secondary | ICD-10-CM | POA: Diagnosis not present

## 2016-10-18 DIAGNOSIS — R933 Abnormal findings on diagnostic imaging of other parts of digestive tract: Secondary | ICD-10-CM

## 2016-10-18 DIAGNOSIS — K5732 Diverticulitis of large intestine without perforation or abscess without bleeding: Secondary | ICD-10-CM | POA: Insufficient documentation

## 2016-10-18 DIAGNOSIS — Z9049 Acquired absence of other specified parts of digestive tract: Secondary | ICD-10-CM | POA: Diagnosis not present

## 2016-10-18 DIAGNOSIS — K760 Fatty (change of) liver, not elsewhere classified: Secondary | ICD-10-CM | POA: Diagnosis not present

## 2016-10-18 LAB — BASIC METABOLIC PANEL
ANION GAP: 10 (ref 5–15)
BUN: 13 mg/dL (ref 6–20)
CHLORIDE: 99 mmol/L — AB (ref 101–111)
CO2: 27 mmol/L (ref 22–32)
Calcium: 9.7 mg/dL (ref 8.9–10.3)
Creatinine, Ser: 0.73 mg/dL (ref 0.44–1.00)
GFR calc non Af Amer: >60 mL/min (ref >60)
GLUCOSE: 143 mg/dL — AB (ref 65–99)
POTASSIUM: 4.1 mmol/L (ref 3.5–5.1)
Sodium: 136 mmol/L (ref 135–145)

## 2016-10-18 MED ORDER — GADOBENATE DIMEGLUMINE 529 MG/ML IV SOLN
15.0000 mL | Freq: Once | INTRAVENOUS | Status: AC | PRN
  Administered 2016-10-18: 15 mL via INTRAVENOUS

## 2016-10-31 ENCOUNTER — Ambulatory Visit (INDEPENDENT_AMBULATORY_CARE_PROVIDER_SITE_OTHER): Payer: Medicare Other | Admitting: General Surgery

## 2016-10-31 ENCOUNTER — Encounter: Payer: Self-pay | Admitting: General Surgery

## 2016-10-31 VITALS — BP 130/80 | HR 74 | Resp 14 | Ht 60.0 in | Wt 173.0 lb

## 2016-10-31 DIAGNOSIS — K5732 Diverticulitis of large intestine without perforation or abscess without bleeding: Secondary | ICD-10-CM

## 2016-10-31 NOTE — Progress Notes (Signed)
Patient ID: Alison Warren, female   DOB: 21-Nov-1945, 71 y.o.   MRN: [id number]  Chief Complaint  Patient presents with  . Follow-up    HPI Alison Warren is a 71 y.o. female here today for her six week follow up diverticulitis. Patient states she is doing well ever since she started on Miralax. Moving her bowels daily.   She does report some sense of urgency with bowel movements, but complete resolution of the previously identified lower abdominal pain. HPI  Past Medical History:  Diagnosis Date  . Arthritis   . Bronchitis    none currently  . Chronic cough   . Costochondritis   . Diabetes mellitus without complication ([place])    8-9 years  . Diverticulitis   . Diverticulitis 08/2016  . GERD (gastroesophageal reflux disease)   . Hypercholesteremia   . Hypertension   . Wears hearing aid    right ear    Past Surgical History:  Procedure Laterality Date  . CATARACT EXTRACTION W/ INTRAOCULAR LENS IMPLANT    . CATARACT EXTRACTION W/PHACO Right 12/07/2014   Procedure: CATARACT EXTRACTION PHACO AND INTRAOCULAR LENS PLACEMENT ([place]);  Surgeon: Ronnell Freshwater, MD;  Location: [facility];  Service: Ophthalmology;  Laterality: Right;  DIABETIC - oral meds  . CHOLECYSTECTOMY  2007  . COLONOSCOPY N/A 02/08/2016   Procedure: COLONOSCOPY;  Surgeon: Lollie Sails, MD;  Location: [facility] ENDOSCOPY;  Service: Endoscopy;  Laterality: N/A;  Diabetes  . MASTOIDECTOMY    . THUMB AMPUTATION     removal of 2nd thumb  . TONSILLECTOMY      Family History  Problem Relation Age of Onset  . Cancer Mother 30       Cervical  . Heart attack Father   . Diabetes Neg Hx   . COPD Neg Hx     Social History Social History  Substance Use Topics  . Smoking status: Never Smoker  . Smokeless tobacco: Never Used  . Alcohol use No    Allergies  Allergen Reactions  . Tramadol Nausea And Vomiting  . Motrin [Ibuprofen] Rash    Current Outpatient Prescriptions  Medication Sig  Dispense Refill  . acetaminophen (TYLENOL) 500 MG tablet Take 500 mg by mouth every 6 (six) hours as needed for moderate pain, fever or headache.     Marland Kitchen amLODipine (NORVASC) 5 MG tablet Take 5 mg by mouth daily. AM    . aspirin 81 MG tablet Take 81 mg by mouth daily. AM    . Calcium Carbonate-Vit D-Min (CALCIUM 1200 PO) Take by mouth daily.    . carvedilol (COREG) 3.125 MG tablet Take 6.25 mg by mouth 2 (two) times daily with a meal.     . docusate sodium (COLACE) 100 MG capsule Take 1 capsule (100 mg total) by mouth 2 (two) times daily. 10 capsule 0  . metFORMIN (GLUCOPHAGE-XR) 500 MG 24 hr tablet Take 1,500 mg by mouth daily.    Marland Kitchen omeprazole (PRILOSEC) 40 MG capsule Take 40 mg by mouth daily.    . polyethylene glycol powder (GLYCOLAX/MIRALAX) powder Take 255 g by mouth daily. One capful daily 255 g 0   No current facility-administered medications for this visit.     Review of Systems Review of Systems  Constitutional: Negative.   Respiratory: Negative.   Cardiovascular: Negative.     Blood pressure 130/80, pulse 74, resp. rate 14, height 5' (1.524 m), weight 173 lb (78.5 kg).  Physical Exam Physical Exam  Constitutional: She is  oriented to person, place, and time. She appears well-developed and well-nourished.  Cardiovascular: Normal rate, regular rhythm and normal heart sounds.   Pulmonary/Chest: Effort normal and breath sounds normal.  Abdominal: Soft. Normal appearance and bowel sounds are normal. There is no hepatomegaly. There is no tenderness.    Neurological: She is alert and oriented to person, place, and time.  Skin: Skin is warm and dry.    Data Reviewed Liver MRI October 18, 2016: IMPRESSION: 1. Early morphologic changes of cirrhosis. No enhancing hepatic lesion. 2. Mild hepatic steatosis. 3. Normal biliary tree post cholecystectomy. 4. Normal pancreas. This study was a follow up to an MRCP of August 2017. Cirrhosis identified at least as early as this  time.  Assessment    Good response to MiraLAX with improve daily defecation. Resolution of lower abdominal pain.  MRI findings as noted above.    Plan    No indication for surgical intervention at this time.    Stay on the Miralax daily. Patient to return as needed. The patient is aware to call back for any questions or concerns.  HPI, Physical Exam, Assessment and Plan have been scribed under the direction and in the presence of Hervey Ard, MD.  Gaspar Cola, CMA  I have completed the exam and reviewed the above documentation for accuracy and completeness.  I agree with the above.  Haematologist has been used and any errors in dictation or transcription are unintentional.  Hervey Ard, M.D., F.A.C.S.  Robert Bellow 10/31/2016, 12:12 PM

## 2016-10-31 NOTE — Patient Instructions (Addendum)
  Stay on the Miralax daily. Patient to return as needed. The patient is aware to call back for any questions or concerns.

## 2017-02-26 ENCOUNTER — Other Ambulatory Visit: Payer: Self-pay | Admitting: Internal Medicine

## 2017-02-26 DIAGNOSIS — Z1239 Encounter for other screening for malignant neoplasm of breast: Secondary | ICD-10-CM

## 2017-02-26 DIAGNOSIS — K760 Fatty (change of) liver, not elsewhere classified: Secondary | ICD-10-CM | POA: Insufficient documentation

## 2017-08-25 IMAGING — CT CT ABD-PELV W/ CM
2 of 5 series · 16 of 46 positions shown, 18 images · IV contrast (APPLIED)
Comparison: CT dated 11/12/2008

CLINICAL DATA: 70-year-old female with abdominal pain.

EXAM:
CT ABDOMEN AND PELVIS WITH CONTRAST
TECHNIQUE: Multidetector CT imaging of the abdomen and pelvis was performed
using the standard protocol following bolus administration of
intravenous contrast.
CONTRAST:  100mL [id number] IOPAMIDOL (SK2E65-HRR) INJECTION 61%

[Series 2: axial st · axial · 0.90mm/px · z∈[-762,-327]mm · 13 of 99 slices shown, 15 images]
[im 6/99  soft-tissue]
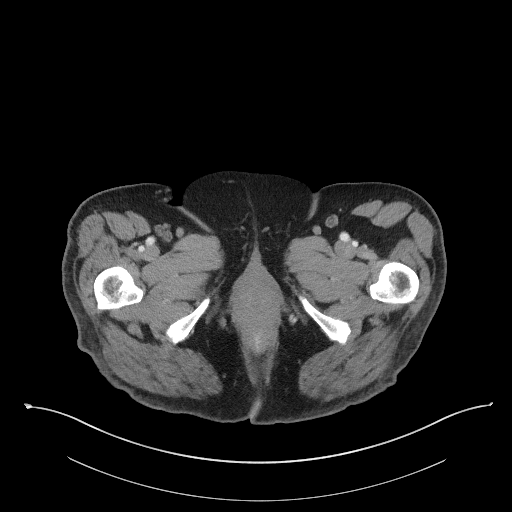
[im 6/99  bone]
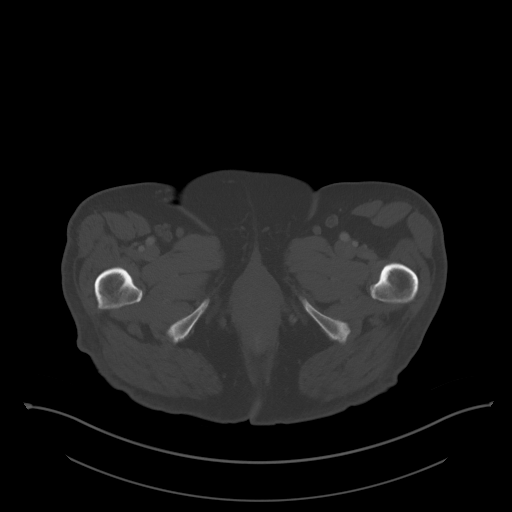
[im 11/99  soft-tissue]
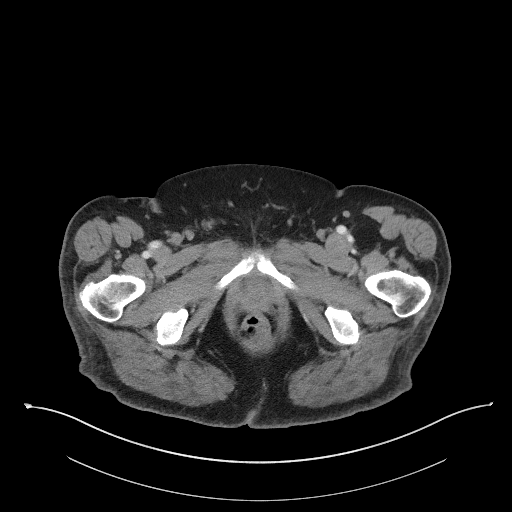
[im 22/99  soft-tissue]
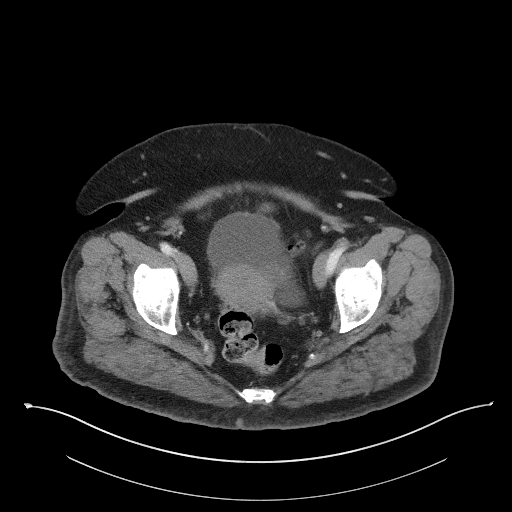
[im 28/99  soft-tissue]
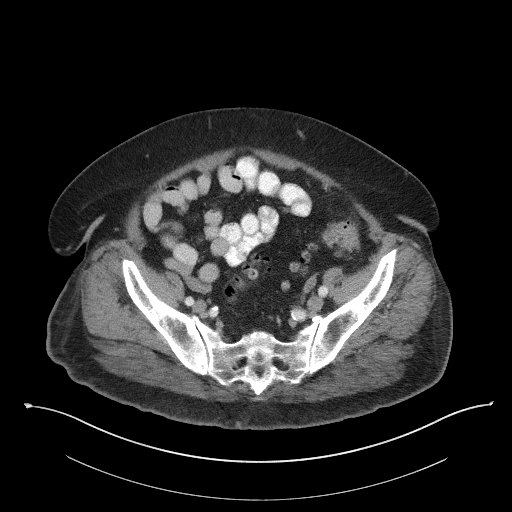
[im 33/99  soft-tissue]
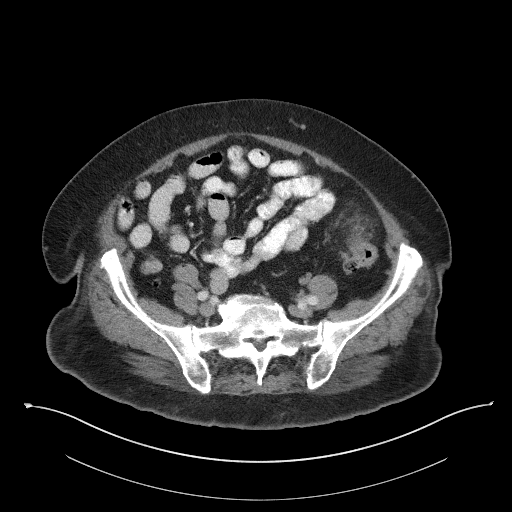
[im 44/99  soft-tissue]
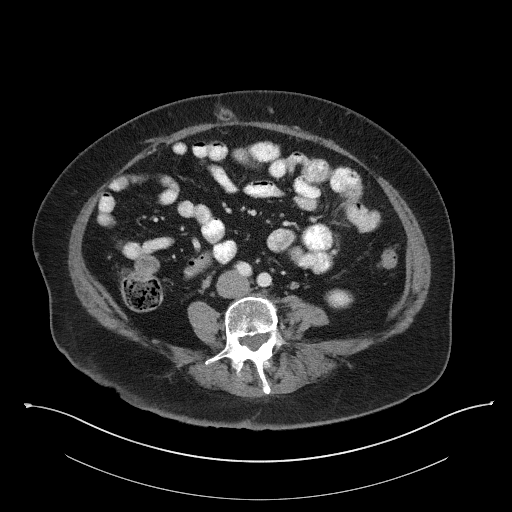
[im 50/99  soft-tissue]
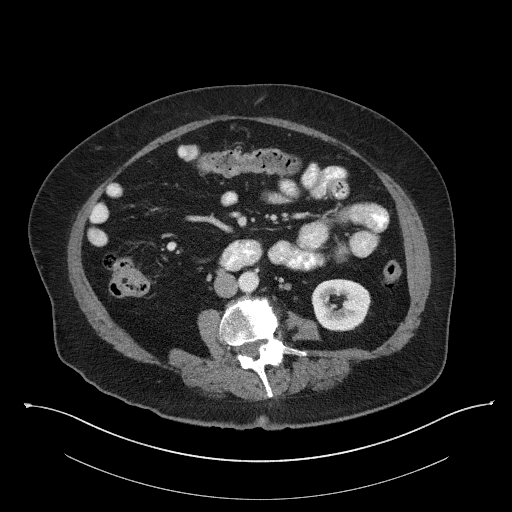
[im 55/99  soft-tissue]
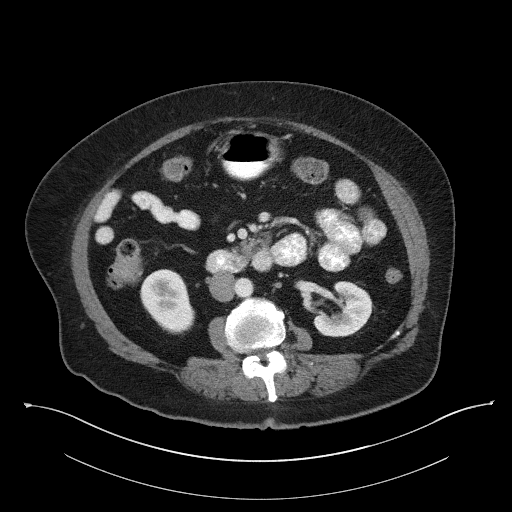
[im 66/99  soft-tissue]
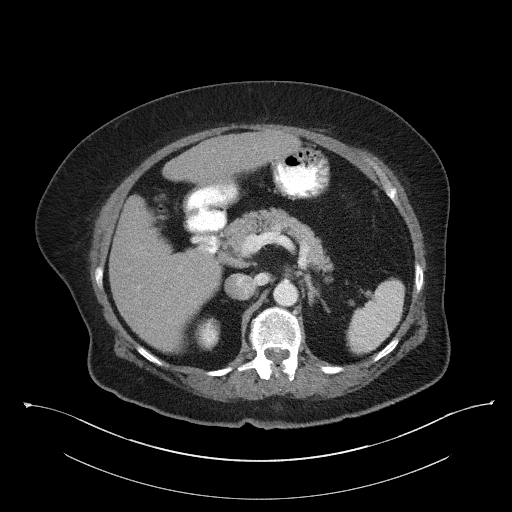
[im 66/99  bone]
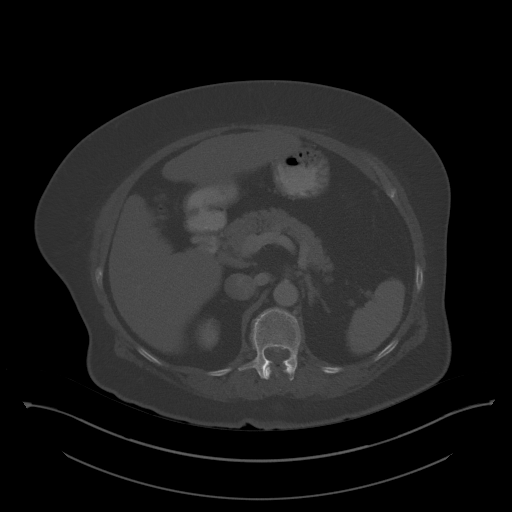
[im 71/99  soft-tissue]
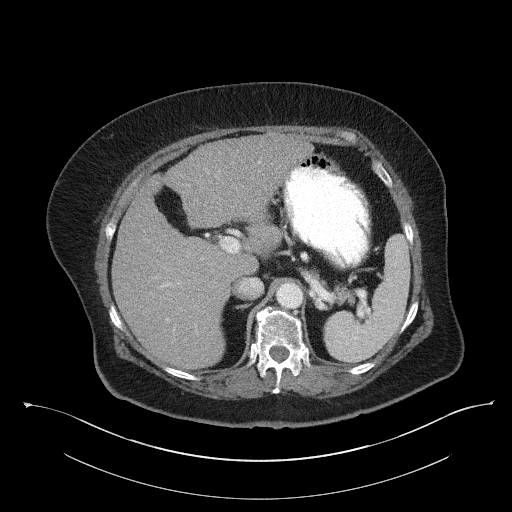
[im 77/99  soft-tissue]
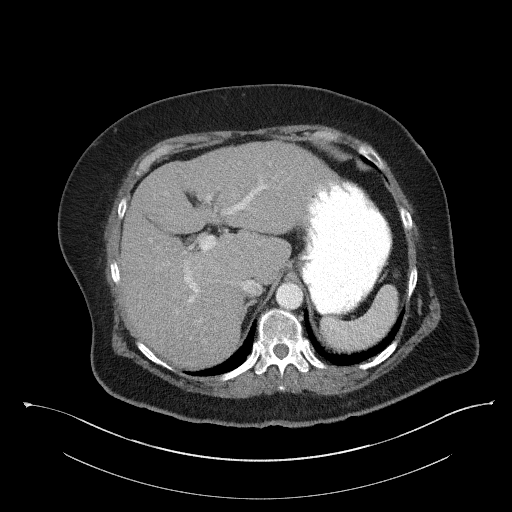
[im 88/99  soft-tissue]
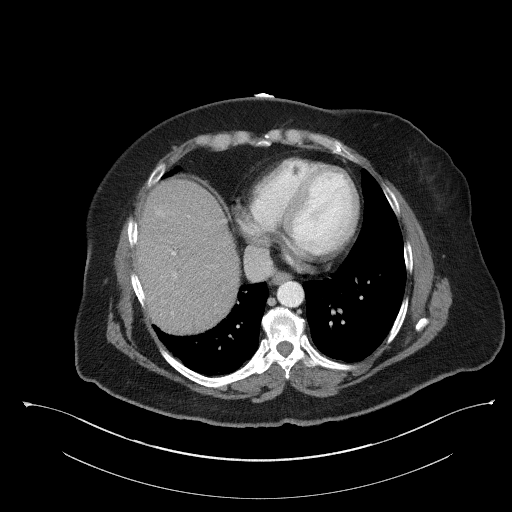
[im 93/99  soft-tissue]
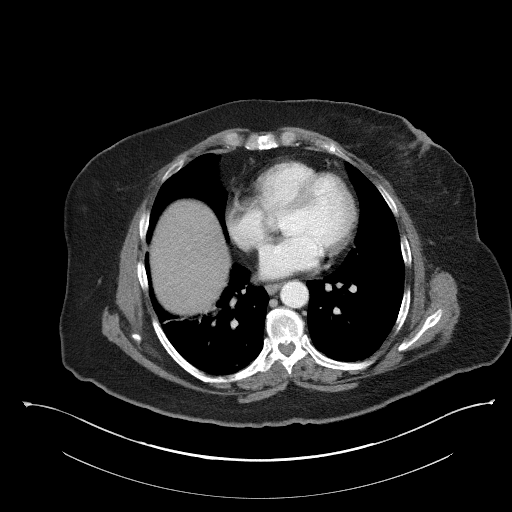

[Series 5: coronal st · coronal · 0.77mm/px · 3 of 99 slices shown]
[im 33/99  soft-tissue]
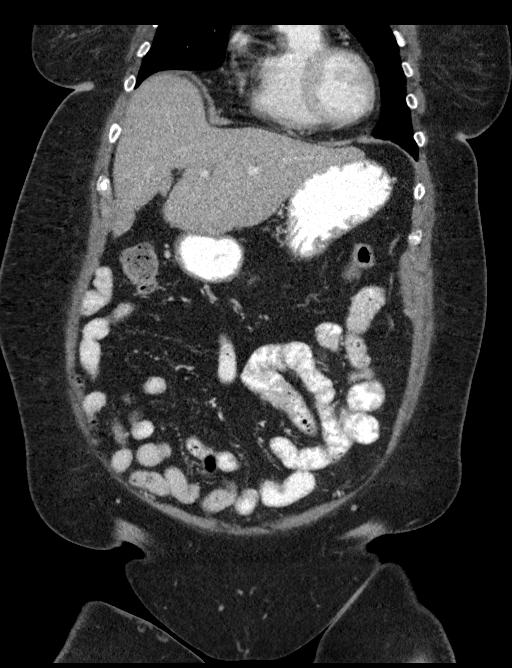
[im 44/99  soft-tissue]
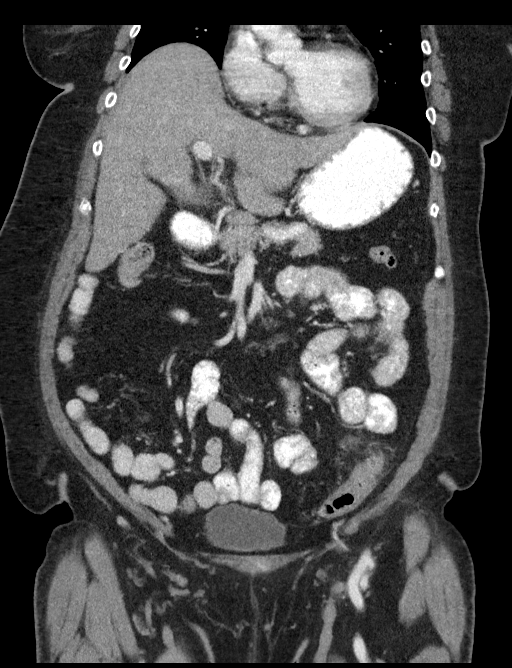
[im 55/99  soft-tissue]
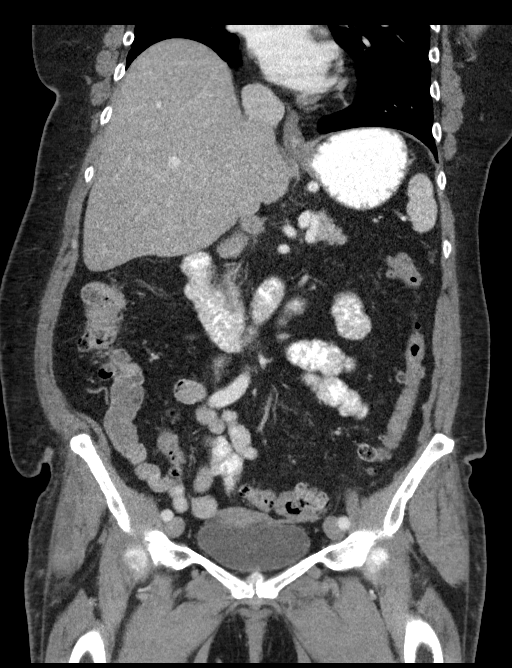

[16 of 46 positions shown; findings below may reference images not displayed]

FINDINGS: Right lung base linear atelectasis/ scarring. The visualized lung
bases are otherwise clear. No intra-abdominal free air or free
fluid.

Cirrhosis. A 9 mm enhancing focus in the right lobe of the liver
(series 2, image 12) is not well characterized but may represent a
regenerating nodule, a flash filling hemangioma, or a portal venous
shunting. Other etiologies including neoplasm are not excluded. MRI
without and with contrast is recommended for further
characterization. Cholecystectomy. The pancreas, spleen, adrenal
glands, kidneys, visualized ureters, and urinary bladder appear
unremarkable. The uterus is anteverted and grossly unremarkable.

There is extensive sigmoid diverticulosis with muscular hypertrophy.
There is focal active inflammatory changes of the sigmoid compatible
with acute diverticulitis. There is no fluid collection or abscess
or evidence of diverticular perforation. There are scattered colonic
diverticula along the course of the colon. There is no evidence of
bowel obstruction. Normal appendix.

The abdominal aorta and IVC appear unremarkable. No portal venous
gas identified. There is no adenopathy. The abdominal wall soft
tissues appear unremarkable. There is multilevel degenerative
changes of the spine. No acute fracture.
IMPRESSION: Sigmoid diverticulitis.  No abscess or perforation.

Cirrhosis. A 9 mm enhancing focus in the right lobe of the liver is
not well characterized. MRI without and with contrast is recommended
for further characterization.

## 2017-09-14 DIAGNOSIS — E669 Obesity, unspecified: Secondary | ICD-10-CM | POA: Insufficient documentation

## 2017-09-14 DIAGNOSIS — E66811 Obesity, class 1: Secondary | ICD-10-CM | POA: Insufficient documentation

## 2018-04-30 DIAGNOSIS — E538 Deficiency of other specified B group vitamins: Secondary | ICD-10-CM | POA: Insufficient documentation

## 2018-05-09 DIAGNOSIS — M1712 Unilateral primary osteoarthritis, left knee: Secondary | ICD-10-CM | POA: Insufficient documentation

## 2018-11-08 ENCOUNTER — Other Ambulatory Visit: Payer: Self-pay | Admitting: Nurse Practitioner

## 2018-11-08 DIAGNOSIS — M79671 Pain in right foot: Secondary | ICD-10-CM

## 2018-11-08 DIAGNOSIS — I8391 Asymptomatic varicose veins of right lower extremity: Secondary | ICD-10-CM

## 2018-11-11 ENCOUNTER — Ambulatory Visit
Admission: RE | Admit: 2018-11-11 | Discharge: 2018-11-11 | Disposition: A | Discharge status: Home / Self Care | Payer: Medicare Other | Source: Ambulatory Visit | Attending: Nurse Practitioner | Admitting: Nurse Practitioner

## 2018-11-11 ENCOUNTER — Other Ambulatory Visit: Payer: Self-pay

## 2018-11-11 DIAGNOSIS — M79671 Pain in right foot: Secondary | ICD-10-CM | POA: Insufficient documentation

## 2018-11-11 DIAGNOSIS — I8391 Asymptomatic varicose veins of right lower extremity: Secondary | ICD-10-CM

## 2018-11-21 DIAGNOSIS — E1169 Type 2 diabetes mellitus with other specified complication: Secondary | ICD-10-CM | POA: Insufficient documentation

## 2018-11-21 DIAGNOSIS — E785 Hyperlipidemia, unspecified: Secondary | ICD-10-CM | POA: Insufficient documentation

## 2018-11-21 DIAGNOSIS — I429 Cardiomyopathy, unspecified: Secondary | ICD-10-CM | POA: Insufficient documentation

## 2018-11-21 DIAGNOSIS — K219 Gastro-esophageal reflux disease without esophagitis: Secondary | ICD-10-CM | POA: Insufficient documentation

## 2018-11-22 ENCOUNTER — Encounter: Payer: Self-pay | Admitting: Podiatry

## 2018-11-22 ENCOUNTER — Other Ambulatory Visit: Payer: Self-pay | Admitting: Podiatry

## 2018-11-22 ENCOUNTER — Other Ambulatory Visit: Payer: Self-pay

## 2018-11-22 ENCOUNTER — Ambulatory Visit (INDEPENDENT_AMBULATORY_CARE_PROVIDER_SITE_OTHER): Payer: Medicare Other

## 2018-11-22 ENCOUNTER — Ambulatory Visit (INDEPENDENT_AMBULATORY_CARE_PROVIDER_SITE_OTHER): Payer: Medicare Other | Admitting: Podiatry

## 2018-11-22 VITALS — Temp 98.2°F

## 2018-11-22 DIAGNOSIS — M778 Other enthesopathies, not elsewhere classified: Secondary | ICD-10-CM

## 2018-11-22 DIAGNOSIS — B351 Tinea unguium: Secondary | ICD-10-CM

## 2018-11-22 DIAGNOSIS — M79676 Pain in unspecified toe(s): Secondary | ICD-10-CM | POA: Diagnosis not present

## 2018-11-22 DIAGNOSIS — Z89021 Acquired absence of right finger(s): Secondary | ICD-10-CM

## 2018-11-22 DIAGNOSIS — M779 Enthesopathy, unspecified: Secondary | ICD-10-CM

## 2018-11-22 DIAGNOSIS — M659 Synovitis and tenosynovitis, unspecified: Secondary | ICD-10-CM | POA: Diagnosis not present

## 2018-11-22 MED ORDER — CLOTRIMAZOLE-BETAMETHASONE 1-0.05 % EX CREA: 1.0000 "application " | TOPICAL_CREAM | Freq: Two times a day (BID) | CUTANEOUS | 3 refills | Status: DC

## 2018-11-22 MED ORDER — MELOXICAM 15 MG PO TABS: 15.0000 mg | ORAL_TABLET | Freq: Every day | ORAL | 1 refills | Status: DC

## 2018-11-22 NOTE — Progress Notes (Signed)
   Subjective:  Patient presents today for pain and tenderness to the right ankle.  Patient states that she has had this right ankle pain for the past 1 month.  She experiences sharp stabbing constant pain when walking.  Patient denies injury.  She does have a foot deformity to the right foot that is been present since birth.  She also complains of hypertrophic thickened discolored nails bilateral 1-5.  Patient presents for further treatment and evaluation.  Patient also complains of bilateral foot pain that itches significantly.  She says that at times her feet burn with itching.  This is been ongoing for several months.    Objective / Physical Exam:  General:  The patient is alert and oriented x3 in no acute distress. Dermatology:  Skin is warm, dry and supple bilateral lower extremities. Negative for open lesions or macerations.  Hyperkeratotic dystrophic nails noted 1-5 bilateral however the patient only has 3 toes present on the right foot, congenital.  Dermatitis noted with peeling of the skin on the weightbearing surfaces of the feet Vascular:  Palpable pedal pulses bilaterally. No edema or erythema noted. Capillary refill within normal limits. Neurological:  Epicritic and protective threshold grossly intact bilaterally.  Musculoskeletal Exam:  Pain on palpation to the anterior lateral medial aspects of the patient's right ankle. Mild edema noted.  Range of motion within normal limits to all pedal and ankle joints bilateral. Muscle strength 5/5 in all groups bilateral.   Radiographic Exam:  Normal osseous mineralization.  Partial development of right foot, congenital noted with loss of tarsal and metatarsal bones.  There are only 3 rays of the foot noted on radiological exam.  Consistent with oligo tactilely  Assessment: 1.  Ankle synovitis right 2.  Congenital Oligodactyly Right 3.  Dystrophic nails/onychomycosis bilateral 4.  Tinea pedis bilateral  Plan of Care:  1. Patient was  evaluated. 2. injection of 0.5 mL Celestone Soluspan injected in the patient's right ankle. 3.  Prescription for meloxicam 15 mg daily 4.  Prescription for Lotrisone cream applied 2 times daily 5.  Recommend good supportive shoes 6.  Mechanical debridement of nails 1-5 performed bilaterally without incident or bleeding 7.  Return to clinic in 6 weeks  Edrick Kins, DPM [facility]  Dr. Edrick Kins, [address]                                        [place], [place] [address]                Office [phone]  Fax [phone]

## 2018-12-14 ENCOUNTER — Other Ambulatory Visit: Payer: Self-pay

## 2018-12-14 ENCOUNTER — Encounter: Payer: Self-pay | Admitting: Gynecology

## 2018-12-14 ENCOUNTER — Ambulatory Visit
Admission: EM | Admit: 2018-12-14 | Discharge: 2018-12-14 | Disposition: A | Discharge status: Home / Self Care | Payer: Medicare Other | Attending: Family Medicine | Admitting: Family Medicine

## 2018-12-14 DIAGNOSIS — M5442 Lumbago with sciatica, left side: Secondary | ICD-10-CM | POA: Diagnosis not present

## 2018-12-14 LAB — URINALYSIS, COMPLETE (UACMP) WITH MICROSCOPIC
Bacteria, UA: NONE SEEN
Bilirubin Urine: NEGATIVE
Glucose, UA: 500 mg/dL — AB
Hgb urine dipstick: NEGATIVE
Ketones, ur: NEGATIVE mg/dL
Leukocytes,Ua: NEGATIVE
Nitrite: NEGATIVE
Protein, ur: NEGATIVE mg/dL
Specific Gravity, Urine: 1.02 (ref 1.005–1.030)
WBC, UA: NONE SEEN WBC/hpf (ref 0–5)
pH: 5 (ref 5.0–8.0)

## 2018-12-14 MED ORDER — PREDNISONE 20 MG PO TABS: 40.0000 mg | ORAL_TABLET | Freq: Every day | ORAL | 0 refills | Status: DC

## 2018-12-14 NOTE — Discharge Instructions (Addendum)
Take medication as prescribed. Ice. Take Tylenol as needed.   Follow up with your primary care physician this week as needed. Return to Urgent care for new or worsening concerns.

## 2018-12-14 NOTE — ED Triage Notes (Signed)
Per patient with left side back pain x yesterday. Patient state no injury and not sure what causing the pain.

## 2018-12-14 NOTE — ED Provider Notes (Signed)
[facility] ____________________________________________  Time seen: Approximately 12:22 PM  I have reviewed the triage vital signs and the nursing notes.   HISTORY  Chief Complaint Back Pain   HPI Alison Warren is a 72 y.o. female presenting for evaluation of left lower back pain present for the last 2 days.  States pain is mostly with movement.  States last night when she laid down in bed and got comfortable she felt fine.  States pain has caused her some issues with bending down.  States she has had occasional pain described as a burning radiation pain down her left posterior leg but reports not constant.  Denies paresthesias, decreased range of motion, weakness.  Denies fall or recent injury.  States she often has some low back pain regularly that comes and goes.  States she has had something similar many years ago.  Did take 1 dose of Tylenol without resolution.  Denies other aggravating or alleviating factors.  Denies dysuria, fevers, abdominal pain, chest pain, atypical shortness of breath or recent sickness.  Reports otherwise doing well.  Ezequiel Kayser, MD: PCP   Past Medical History:  Diagnosis Date  . Arthritis   . Bronchitis    none currently  . Chronic cough   . Costochondritis   . Diabetes mellitus without complication ([place])    8-9 years  . Diverticulitis   . Diverticulitis 08/2016  . GERD (gastroesophageal reflux disease)   . Hypercholesteremia   . Hypertension   . Wears hearing aid    right ear    Patient Active Problem List   Diagnosis Date Noted  . Cardiomyopathy ([place]) 11/21/2018  . GERD (gastroesophageal reflux disease) 11/21/2018  . Hyperlipidemia associated with type 2 diabetes mellitus ([place]) 11/21/2018  . Localized osteoarthritis of left knee 05/09/2018  . B12 deficiency 04/30/2018  . Obesity (BMI 30.0-34.9) 09/14/2017  . Hepatic steatosis 02/26/2017  . Diverticulitis of colon 09/22/2016  . Vitamin D deficiency 09/01/2016  .  Hypotension 08/24/2016  . Lactic acidosis 08/24/2016  . Diabetes ([place]) 08/24/2016  . Acute diverticulitis 08/21/2016  . Hx of adenomatous colonic polyps 02/24/2016  . Cirrhosis of liver without ascites ([place]) 12/16/2015  . Osteopenia of multiple sites 09/09/2015  . Seasonal allergic rhinitis due to pollen 08/24/2015  . 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 High risk medication use 02/23/2015  . Hypomagnesemia 08/19/2014  . Abnormal mammogram with microcalcification 07/02/2014  . Muscle cramp, nocturnal 08/20/2013  . Hearing loss of both ears 08/19/2013    Past Surgical History:  Procedure Laterality Date  . CATARACT EXTRACTION W/ INTRAOCULAR LENS IMPLANT    . CATARACT EXTRACTION W/PHACO Right 12/07/2014   Procedure: CATARACT EXTRACTION PHACO AND INTRAOCULAR LENS PLACEMENT ([place]);  Surgeon: Ronnell Freshwater, MD;  Location: [facility];  Service: Ophthalmology;  Laterality: Right;  DIABETIC - oral meds  . CHOLECYSTECTOMY  2007  . COLONOSCOPY N/A 02/08/2016   Procedure: COLONOSCOPY;  Surgeon: Lollie Sails, MD;  Location: [facility];  Service: Endoscopy;  Laterality: N/A;  Diabetes  . MASTOIDECTOMY    . THUMB AMPUTATION     removal of 2nd thumb  . TONSILLECTOMY       No current facility-administered medications for this encounter.   Current Outpatient Medications:  .  acetaminophen (TYLENOL) 500 MG tablet, Take 500 mg by mouth every 6 (six) hours as needed for moderate pain, fever or headache. , Disp: , Rfl:  .  albuterol (VENTOLIN HFA) 108 (90 Base) MCG/ACT inhaler, Inhale into the  lungs., Disp: , Rfl:  .  amLODipine (NORVASC) 5 MG tablet, Take 5 mg by mouth daily. AM, Disp: , Rfl:  .  aspirin 81 MG tablet, Take 81 mg by mouth daily. AM, Disp: , Rfl:  .  Calcium Carbonate-Vit D-Min (CALCIUM 1200 PO), Take by mouth daily., Disp: , Rfl:  .  capsaicin (ZOSTRIX) 0.025 % cream, Apply topically., Disp: , Rfl:  .  carvedilol (COREG) 3.125 MG tablet, Take 6.25  mg by mouth 2 (two) times daily with a meal. , Disp: , Rfl:  .  Cholecalciferol (VITAMIN D3) 50 MCG (2000 UT) capsule, Take by mouth., Disp: , Rfl:  .  clotrimazole-betamethasone (LOTRISONE) cream, Apply 1 application topically 2 (two) times daily., Disp: 45 g, Rfl: 3 .  docusate sodium (COLACE) 100 MG capsule, Take 1 capsule (100 mg total) by mouth 2 (two) times daily., Disp: 10 capsule, Rfl: 0 .  glucose blood (ONETOUCH ULTRA) test strip, USE AS DIRECTED DAILY, Disp: , Rfl:  .  magnesium oxide (MAG-OX) 400 MG tablet, Take by mouth., Disp: , Rfl:  .  metFORMIN (GLUCOPHAGE-XR) 500 MG 24 hr tablet, Take 1,500 mg by mouth daily., Disp: , Rfl:  .  omeprazole (PRILOSEC) 40 MG capsule, Take 40 mg by mouth daily., Disp: , Rfl:  .  predniSONE (DELTASONE) 20 MG tablet, Take 2 tablets (40 mg total) by mouth daily., Disp: 10 tablet, Rfl: 0  Allergies Tramadol and Motrin [ibuprofen]  Family History  Problem Relation Age of Onset  . Cancer Mother 30       Cervical  . Heart attack Father   . Diabetes Neg Hx   . COPD Neg Hx     Social History Social History   Tobacco Use  . Smoking status: Never Smoker  . Smokeless tobacco: Never Used  Substance Use Topics  . Alcohol use: No  . Drug use: No    Review of Systems Constitutional: No fever Cardiovascular: Denies chest pain. Respiratory: Denies shortness of breath. Gastrointestinal: No abdominal pain.  No nausea, no vomiting.  No diarrhea.  No constipation. Genitourinary: Negative for dysuria. Musculoskeletal: Positive for back pain. Skin: Negative for rash. Neurological: Negative for focal weakness or numbness.   ____________________________________________   PHYSICAL EXAM:  VITAL SIGNS: ED Triage Vitals  Enc Vitals Group     BP 12/14/18 1142 124/75     Pulse Rate 12/14/18 1142 77     Resp 12/14/18 1142 16     Temp 12/14/18 1142 98.1 F (36.7 C)     Temp Source 12/14/18 1142 Oral     SpO2 12/14/18 1142 97 %     Weight  12/14/18 1135 170 lb (77.1 kg)     Height 12/14/18 1135 5\' 4"  (1.626 m)     Head Circumference --      [place] Flow --      Pain Score 12/14/18 1134 7     Pain Loc --      Pain Edu? --      Excl. in [place]? --     Constitutional: Alert and oriented. Well appearing and in no acute distress. Eyes: Conjunctivae are normal.  ENT      Head: Normocephalic and atraumatic. Cardiovascular: Normal rate, regular rhythm. Grossly normal heart sounds.  Good peripheral circulation. Respiratory: Normal respiratory effort without tachypnea nor retractions. Breath sounds are clear and equal bilaterally. No wheezes, rales, rhonchi. Gastrointestinal: Soft and nontender.  No CVA tenderness. Musculoskeletal:   No midline cervical or thoracic tenderness to  palpation. Bilateral pedal pulses equal and easily palpated.   Except: Minimal midline lumbar tenderness to palpation, and mild tenderness left paralumbar tenderness into the left sciatic notch, no rash, no hip or pelvis tenderness, able to fully weight-bear independently to each leg as well as to lift each leg without difficulty, no saddle anesthesia, amatory with steady gait. Neurologic:  Normal speech and language. No gross focal neurologic deficits are appreciated.  Skin:  Skin is warm, dry and intact. No rash noted. Psychiatric: Mood and affect are normal. Speech and behavior are normal. Patient exhibits appropriate insight and judgment   ___________________________________________   LABS (all labs ordered are listed, but only abnormal results are displayed)  Labs Reviewed  URINALYSIS, COMPLETE (UACMP) WITH MICROSCOPIC - Abnormal; Notable for the following components:      Result Value   Glucose, UA 500 (*)    All other components within normal limits   ____________________________________________   PROCEDURES Procedures   INITIAL IMPRESSION / ASSESSMENT AND PLAN / ED COURSE  Pertinent labs & imaging results that were available during my care of  the patient were reviewed by me and considered in my medical decision making (see chart for details).  Well-appearing patient.  No acute distress.  Lower back pain.  Lower back sciatica.  Suspect osteoarthritis.  Is no injury or trauma will defer x-ray at this time, patient agrees.  Patient did take Tylenol.  Allergic to Motrin.  Reports has tolerated prednisone well in the past.  Will treat with low-dose prednisone for 5 days.  Ice, heat, supportive care.Discussed indication, risks and benefits of medications with patient.  Discussed follow up with Primary care physician this week. Discussed follow up and return parameters including no resolution or any worsening concerns. Patient verbalized understanding and agreed to plan.   ____________________________________________   FINAL CLINICAL IMPRESSION(S) / ED DIAGNOSES  Final diagnoses:  Acute midline low back pain with left-sided sciatica     ED Discharge Orders         Ordered    predniSONE (DELTASONE) 20 MG tablet  Daily     12/14/18 1213           Note: This dictation was prepared with Dragon dictation along with smaller phrase technology. Any transcriptional errors that result from this process are unintentional.         Marylene Land, NP 12/14/18 1225

## 2019-01-03 ENCOUNTER — Other Ambulatory Visit: Payer: Self-pay

## 2019-01-03 ENCOUNTER — Ambulatory Visit (INDEPENDENT_AMBULATORY_CARE_PROVIDER_SITE_OTHER): Payer: Medicare Other | Admitting: Podiatry

## 2019-01-03 DIAGNOSIS — Z89021 Acquired absence of right finger(s): Secondary | ICD-10-CM

## 2019-01-03 DIAGNOSIS — M65071 Abscess of tendon sheath, right ankle and foot: Secondary | ICD-10-CM

## 2019-01-03 DIAGNOSIS — M659 Synovitis and tenosynovitis, unspecified: Secondary | ICD-10-CM

## 2019-01-03 DIAGNOSIS — B351 Tinea unguium: Secondary | ICD-10-CM

## 2019-01-03 DIAGNOSIS — M79676 Pain in unspecified toe(s): Secondary | ICD-10-CM

## 2019-01-06 NOTE — Progress Notes (Signed)
   Subjective: 73 year old female presenting today for follow up evaluation of right ankle pain, fungal nails and tinea pedis bilaterally. She states she is doing well. She reports an improvement in the ankle pain and states the injection and Meloxicam have helped reduce the pain to mild and only intermittent. She states she has been using the Lotrisone cream with some relief but she is still concerned about the skin. There are no modifying factors noted. Patient is here for further evaluation and treatment.   Past Medical History:  Diagnosis Date  . Arthritis   . Bronchitis    none currently  . Chronic cough   . Costochondritis   . Diabetes mellitus without complication ([place])    8-9 years  . Diverticulitis   . Diverticulitis 08/2016  . GERD (gastroesophageal reflux disease)   . Hypercholesteremia   . Hypertension   . Wears hearing aid    right ear     Objective / Physical Exam:  General:  The patient is alert and oriented x3 in no acute distress. Dermatology:  Skin is warm, dry and supple bilateral lower extremities. Negative for open lesions or macerations.  Hyperkeratotic dystrophic nails noted 1-5 bilateral however the patient only has 3 toes present on the right foot, congenital.  Dermatitis noted with peeling of the skin on the weightbearing surfaces of the feet Vascular:  Palpable pedal pulses bilaterally. No edema or erythema noted. Capillary refill within normal limits. Neurological:  Epicritic and protective threshold grossly intact bilaterally.  Musculoskeletal Exam:  Pain on palpation to the anterior lateral medial aspects of the patient's right ankle. Mild edema noted. Oligodactyly noted to the right foot congenital.  Assessment: 1. Ankle synovitis right - improved 2. Congenital Oligodactyly Right 3. Dystrophic nails/onychomycosis bilateral 4. Tinea pedis bilateral - improved   Plan of Care:  1. Patient was evaluated. 2. Injection of 0.5 mL Celestone Soluspan  injected in the patient's right ankle. 3. Continue using Lotrisone cream daily.  4. Return to clinic in 3 months for routine care.   Edrick Kins, DPM [facility]  Dr. Edrick Kins, [address]                                        [place], [place] [address]                Office [phone]  Fax [phone]

## 2019-04-07 ENCOUNTER — Ambulatory Visit: Payer: Medicare Other | Admitting: Podiatry

## 2020-01-12 ENCOUNTER — Other Ambulatory Visit: Payer: Self-pay | Admitting: Gastroenterology

## 2020-01-12 DIAGNOSIS — R1031 Right lower quadrant pain: Secondary | ICD-10-CM

## 2020-01-19 ENCOUNTER — Other Ambulatory Visit: Payer: Self-pay

## 2020-01-19 ENCOUNTER — Ambulatory Visit
Admission: RE | Admit: 2020-01-19 | Discharge: 2020-01-19 | Disposition: A | Discharge status: Home / Self Care | Payer: Medicare Other | Source: Ambulatory Visit | Attending: Gastroenterology | Admitting: Gastroenterology

## 2020-01-19 DIAGNOSIS — I700 Atherosclerosis of aorta: Secondary | ICD-10-CM | POA: Insufficient documentation

## 2020-01-19 DIAGNOSIS — R1031 Right lower quadrant pain: Secondary | ICD-10-CM | POA: Diagnosis not present

## 2020-01-19 MED ORDER — IOHEXOL 300 MG/ML  SOLN
100.0000 mL | Freq: Once | INTRAMUSCULAR | Status: AC | PRN
  Administered 2020-01-19: 100 mL via INTRAVENOUS

## 2020-02-09 DIAGNOSIS — M4716 Other spondylosis with myelopathy, lumbar region: Secondary | ICD-10-CM | POA: Insufficient documentation

## 2020-02-12 ENCOUNTER — Other Ambulatory Visit (HOSPITAL_COMMUNITY): Payer: Self-pay | Admitting: Physical Medicine & Rehabilitation

## 2020-02-12 ENCOUNTER — Other Ambulatory Visit: Payer: Self-pay | Admitting: Physical Medicine & Rehabilitation

## 2020-02-12 DIAGNOSIS — M5416 Radiculopathy, lumbar region: Secondary | ICD-10-CM

## 2020-02-12 DIAGNOSIS — M5442 Lumbago with sciatica, left side: Secondary | ICD-10-CM | POA: Insufficient documentation

## 2020-02-12 DIAGNOSIS — G8929 Other chronic pain: Secondary | ICD-10-CM | POA: Insufficient documentation

## 2020-02-25 ENCOUNTER — Other Ambulatory Visit: Payer: Medicare Other

## 2020-02-27 ENCOUNTER — Encounter: Admission: RE | Payer: Self-pay | Source: Home / Self Care

## 2020-02-27 ENCOUNTER — Ambulatory Visit: Admission: RE | Admit: 2020-02-27 | Payer: Medicare Other | Source: Home / Self Care

## 2020-02-27 SURGERY — ESOPHAGOGASTRODUODENOSCOPY (EGD) WITH PROPOFOL
Anesthesia: General

## 2020-03-01 ENCOUNTER — Ambulatory Visit
Admission: RE | Admit: 2020-03-01 | Discharge: 2020-03-01 | Disposition: A | Discharge status: Home / Self Care | Payer: Medicare Other | Source: Ambulatory Visit | Attending: Physical Medicine & Rehabilitation | Admitting: Physical Medicine & Rehabilitation

## 2020-03-01 ENCOUNTER — Other Ambulatory Visit: Payer: Self-pay

## 2020-03-01 DIAGNOSIS — M5416 Radiculopathy, lumbar region: Secondary | ICD-10-CM | POA: Insufficient documentation

## 2020-03-12 DIAGNOSIS — M62838 Other muscle spasm: Secondary | ICD-10-CM | POA: Insufficient documentation

## 2020-03-12 DIAGNOSIS — E1165 Type 2 diabetes mellitus with hyperglycemia: Secondary | ICD-10-CM | POA: Diagnosis not present

## 2020-03-12 DIAGNOSIS — Z7982 Long term (current) use of aspirin: Secondary | ICD-10-CM | POA: Diagnosis not present

## 2020-03-12 DIAGNOSIS — I10 Essential (primary) hypertension: Secondary | ICD-10-CM | POA: Diagnosis not present

## 2020-03-12 DIAGNOSIS — Z79899 Other long term (current) drug therapy: Secondary | ICD-10-CM | POA: Insufficient documentation

## 2020-03-12 DIAGNOSIS — I7090 Unspecified atherosclerosis: Secondary | ICD-10-CM | POA: Insufficient documentation

## 2020-03-12 DIAGNOSIS — M5412 Radiculopathy, cervical region: Secondary | ICD-10-CM | POA: Diagnosis not present

## 2020-03-12 DIAGNOSIS — Z7984 Long term (current) use of oral hypoglycemic drugs: Secondary | ICD-10-CM | POA: Diagnosis not present

## 2020-03-12 DIAGNOSIS — M25511 Pain in right shoulder: Secondary | ICD-10-CM | POA: Diagnosis present

## 2020-03-13 ENCOUNTER — Emergency Department: Payer: Medicare Other

## 2020-03-13 ENCOUNTER — Other Ambulatory Visit: Payer: Self-pay

## 2020-03-13 ENCOUNTER — Emergency Department
Admission: EM | Admit: 2020-03-13 | Discharge: 2020-03-13 | Disposition: A | Discharge status: Home / Self Care | Payer: Medicare Other | Attending: Emergency Medicine | Admitting: Emergency Medicine

## 2020-03-13 DIAGNOSIS — M62838 Other muscle spasm: Secondary | ICD-10-CM

## 2020-03-13 DIAGNOSIS — R739 Hyperglycemia, unspecified: Secondary | ICD-10-CM

## 2020-03-13 DIAGNOSIS — M5412 Radiculopathy, cervical region: Secondary | ICD-10-CM

## 2020-03-13 DIAGNOSIS — I7090 Unspecified atherosclerosis: Secondary | ICD-10-CM

## 2020-03-13 LAB — CBC
HCT: 45.9 % (ref 36.0–46.0)
Hemoglobin: 14.6 g/dL (ref 12.0–15.0)
MCH: 28.9 pg (ref 26.0–34.0)
MCHC: 31.8 g/dL (ref 30.0–36.0)
MCV: 90.9 fL (ref 80.0–100.0)
Platelets: 218 10*3/uL (ref 150–400)
RBC: 5.05 MIL/uL (ref 3.87–5.11)
RDW: 13.5 % (ref 11.5–15.5)
WBC: 8.7 10*3/uL (ref 4.0–10.5)
nRBC: 0 % (ref 0.0–0.2)

## 2020-03-13 LAB — BASIC METABOLIC PANEL
Anion gap: 12 (ref 5–15)
BUN: 11 mg/dL (ref 8–23)
CO2: 23 mmol/L (ref 22–32)
Calcium: 9.7 mg/dL (ref 8.9–10.3)
Chloride: 99 mmol/L (ref 98–111)
Creatinine, Ser: 0.76 mg/dL (ref 0.44–1.00)
GFR, Estimated: >60 mL/min (ref >60)
Glucose, Bld: 227 mg/dL — ABNORMAL HIGH (ref 70–99)
Potassium: 4 mmol/L (ref 3.5–5.1)
Sodium: 134 mmol/L — ABNORMAL LOW (ref 135–145)

## 2020-03-13 LAB — TROPONIN I (HIGH SENSITIVITY)
Troponin I (High Sensitivity): 5 ng/L (ref <18)
Troponin I (High Sensitivity): 5 ng/L (ref <18)

## 2020-03-13 MED ORDER — AMLODIPINE BESYLATE 5 MG PO TABS
5.0000 mg | ORAL_TABLET | Freq: Every day | ORAL | Status: DC
  Administered 2020-03-13: 5 mg via ORAL
  Filled 2020-03-13: qty 1

## 2020-03-13 MED ORDER — METFORMIN HCL ER 750 MG PO TB24
1500.0000 mg | ORAL_TABLET | Freq: Every day | ORAL | Status: DC
  Filled 2020-03-13: qty 2

## 2020-03-13 MED ORDER — CARVEDILOL 6.25 MG PO TABS
6.2500 mg | ORAL_TABLET | Freq: Two times a day (BID) | ORAL | Status: DC
  Administered 2020-03-13: 6.25 mg via ORAL
  Filled 2020-03-13: qty 1

## 2020-03-13 MED ORDER — GABAPENTIN 100 MG PO CAPS
100.0000 mg | ORAL_CAPSULE | Freq: Once | ORAL | Status: AC
  Administered 2020-03-13: 100 mg via ORAL
  Filled 2020-03-13: qty 1

## 2020-03-13 MED ORDER — ACETAMINOPHEN 500 MG PO TABS
1000.0000 mg | ORAL_TABLET | Freq: Once | ORAL | Status: AC
  Administered 2020-03-13: 1000 mg via ORAL
  Filled 2020-03-13: qty 2

## 2020-03-13 MED ORDER — LIDOCAINE 5 % EX PTCH
1.0000 | MEDICATED_PATCH | CUTANEOUS | Status: DC
  Administered 2020-03-13: 1 via TRANSDERMAL
  Filled 2020-03-13: qty 1

## 2020-03-13 NOTE — ED Notes (Signed)
Attempted to call [name] for pt to be discharged. Left voicemail. Awaiting call back.

## 2020-03-13 NOTE — ED Notes (Signed)
Ems note: pt with hypertension, greater than 474 systolic, no ekg obtained by ems. Per ems pt has neck pain and back pain.

## 2020-03-13 NOTE — ED Triage Notes (Addendum)
Pt endorsing 10/10 right shoulder pain and headache starting tonight, denies injury, endorses shortness of breath, denies nausea/vomiting, diaphoresis, or dizziness. States she started taking gabapentin on 11/29 for chronic back pain and is worried this is related. Per pt she was hypertensive with EMS with systolic BP in 820V, BP 906/89 in triage.

## 2020-03-13 NOTE — ED Notes (Signed)
Patient denies pain and is resting comfortably.  

## 2020-03-13 NOTE — ED Notes (Signed)
Pt assisted to restroom to void.  

## 2020-03-13 NOTE — ED Provider Notes (Signed)
[facility] Emergency Department Provider Note  ____________________________________________   First MD Initiated Contact with Patient 03/13/20 [id number]     (approximate)  I have reviewed the triage vital signs and the nursing notes.   HISTORY  Chief Complaint Shoulder Pain   HPI Alison Warren is a 74 y.o. female with past medical history of asthma and chronic bronchitis, DM, HTN, HDL, GERD, and chronic arthritis in her back and bilateral shoulders who presents for assessment of some pain in her right shoulder and right neck that began yesterday.  Patient states she had some pain in her mid back last week and was started on gabapentin by her PCP but feels the pain is moved up into her right shoulder and thinks gabapentin did not help when she initially took it so stopped taking it.  Denies any recent falls or injuries.  Denies any clear precipitating events including any twisting heavy lifting or other clear precipitating factors.  No prior similar episodes.  Aggravating factors include turning the head or twisting it and any palpation of the right neck.  No alleviating factors.  Patient denies any other acute sick symptoms including fevers, chills, change in her chronic cough, chest pain, acute abdominal pain, mid or lower back pain last couple of days, urinary symptoms, diarrhea, rash or other sick symptoms.  Denies EtOH or illicit drug use.  He has never been a smoker.         Past Medical History:  Diagnosis Date  . Arthritis   . Bronchitis    none currently  . Chronic cough   . Costochondritis   . Diabetes mellitus without complication ([place])    8-9 years  . Diverticulitis   . Diverticulitis 08/2016  . GERD (gastroesophageal reflux disease)   . Hypercholesteremia   . Hypertension   . Wears hearing aid    right ear    Patient Active Problem List   Diagnosis Date Noted  . Cardiomyopathy ([place]) 11/21/2018  . GERD (gastroesophageal reflux disease)  11/21/2018  . Hyperlipidemia associated with type 2 diabetes mellitus ([place]) 11/21/2018  . Localized osteoarthritis of left knee 05/09/2018  . B12 deficiency 04/30/2018  . Obesity (BMI 30.0-34.9) 09/14/2017  . Hepatic steatosis 02/26/2017  . Diverticulitis of colon 09/22/2016  . Vitamin D deficiency 09/01/2016  . Hypotension 08/24/2016  . Lactic acidosis 08/24/2016  . Diabetes ([place]) 08/24/2016  . Acute diverticulitis 08/21/2016  . Hx of adenomatous colonic polyps 02/24/2016  . Cirrhosis of liver without ascites ([place]) 12/16/2015  . Osteopenia of multiple sites 09/09/2015  . Seasonal allergic rhinitis due to pollen 08/24/2015  . 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 High risk medication use 02/23/2015  . Hypomagnesemia 08/19/2014  . Abnormal mammogram with microcalcification 07/02/2014  . Muscle cramp, nocturnal 08/20/2013  . Hearing loss of both ears 08/19/2013    Past Surgical History:  Procedure Laterality Date  . CATARACT EXTRACTION W/ INTRAOCULAR LENS IMPLANT    . CATARACT EXTRACTION W/PHACO Right 12/07/2014   Procedure: CATARACT EXTRACTION PHACO AND INTRAOCULAR LENS PLACEMENT ([place]);  Surgeon: Ronnell Freshwater, MD;  Location: [place];  Service: Ophthalmology;  Laterality: Right;  DIABETIC - oral meds  . CHOLECYSTECTOMY  2007  . COLONOSCOPY N/A 02/08/2016   Procedure: COLONOSCOPY;  Surgeon: Lollie Sails, MD;  Location: [facility] ENDOSCOPY;  Service: Endoscopy;  Laterality: N/A;  Diabetes  . MASTOIDECTOMY    . THUMB AMPUTATION     removal of 2nd thumb  . TONSILLECTOMY  Prior to Admission medications   Medication Sig Start Date End Date Taking? Authorizing Provider  acetaminophen (TYLENOL) 500 MG tablet Take 500 mg by mouth every 6 (six) hours as needed for moderate pain, fever or headache.     [provider]  albuterol (VENTOLIN HFA) 108 (90 Base) MCG/ACT inhaler Inhale into the lungs. 11/06/17   [provider]  amLODipine  (NORVASC) 5 MG tablet Take 5 mg by mouth daily. AM    [provider]  aspirin 81 MG tablet Take 81 mg by mouth daily. AM    [provider]  Calcium Carbonate-Vit D-Min (CALCIUM 1200 PO) Take by mouth daily.    [provider]  carvedilol (COREG) 3.125 MG tablet Take 6.25 mg by mouth 2 (two) times daily with a meal.     [provider]  Cholecalciferol (VITAMIN D3) 50 MCG (2000 UT) capsule Take by mouth.    [provider]  clotrimazole-betamethasone (LOTRISONE) cream Apply 1 application topically 2 (two) times daily. 11/22/18   Edrick Kins, DPM  docusate sodium (COLACE) 100 MG capsule Take 1 capsule (100 mg total) by mouth 2 (two) times daily. 08/24/16   Theodoro Grist, MD  glucose blood (ONETOUCH ULTRA) test strip USE AS DIRECTED DAILY 12/12/17   [provider]  metFORMIN (GLUCOPHAGE-XR) 500 MG 24 hr tablet Take 1,500 mg by mouth daily.    [provider]  omeprazole (PRILOSEC) 40 MG capsule Take 40 mg by mouth daily.    [provider]  OneTouch Delica Lancets 23J MISC See admin instructions. 11/21/18   [provider]    Allergies Tramadol and Motrin [ibuprofen]  Family History  Problem Relation Age of Onset  . Cancer Mother 30       Cervical  . Heart attack Father   . Diabetes Neg Hx   . COPD Neg Hx     Social History Social History   Tobacco Use  . Smoking status: Never Smoker  . Smokeless tobacco: Never Used  Vaping Use  . Vaping Use: Never used  Substance Use Topics  . Alcohol use: No  . Drug use: No    Review of Systems  Review of Systems  Constitutional: Negative for chills and fever.  HENT: Negative for sore throat.   Eyes: Negative for pain.  Respiratory: Negative for cough and stridor.   Cardiovascular: Negative for chest pain.  Gastrointestinal: Negative for vomiting.  Musculoskeletal: Positive for joint pain ( R shoulder), myalgias ( R shoulder, R neck) and neck pain.  Skin:  Negative for rash.  Neurological: Negative for seizures, loss of consciousness and headaches.  Psychiatric/Behavioral: Negative for suicidal ideas.  All other systems reviewed and are negative.     ____________________________________________   PHYSICAL EXAM:  VITAL SIGNS: ED Triage Vitals  Enc Vitals Group     BP 03/13/20 0011 (!) 163/87     Pulse Rate 03/13/20 0011 78     Resp 03/13/20 0011 18     Temp 03/13/20 0011 98.3 F (36.8 C)     Temp Source 03/13/20 0011 Oral     SpO2 03/13/20 0011 96 %     Weight 03/13/20 0011 170 lb (77.1 kg)     Height 03/13/20 0011 5\' 4"  (1.626 m)     Head Circumference --      Peak Flow --      Pain Score 03/13/20 0019 10     Pain Loc --      Pain Edu? --  Excl. in [place]? --    Vitals:   03/13/20 0603 03/13/20 0847  BP: (!) 150/97 (!) 155/96  Pulse: 78 77  Resp: 16 18  Temp:  98.2 F (36.8 C)  SpO2: 94% 95%   Physical Exam Vitals and nursing note reviewed.  Constitutional:      General: She is not in acute distress.    Appearance: She is well-developed.  HENT:     Head: Normocephalic and atraumatic.     Right Ear: External ear normal.     Left Ear: External ear normal.     Nose: Nose normal.  Eyes:     Conjunctiva/sclera: Conjunctivae normal.  Cardiovascular:     Rate and Rhythm: Normal rate and regular rhythm.     Heart sounds: No murmur heard.   Pulmonary:     Effort: Pulmonary effort is normal. No respiratory distress.     Breath sounds: Normal breath sounds.  Abdominal:     Palpations: Abdomen is soft.     Tenderness: There is no abdominal tenderness.  Musculoskeletal:     Cervical back: Neck supple.  Skin:    General: Skin is warm and dry.     Capillary Refill: Capillary refill takes less than 2 seconds.  Neurological:     Mental Status: She is alert and oriented to person, place, and time.  Psychiatric:        Mood and Affect: Mood normal.     Cranial nerves II through XII grossly intact.  Patient has full  and symmetric strength in her bilateral upper extremities.  Sensation is intact in the distribution of the radial ulnar and median nerves in the bilateral upper extremities.  2+ radial pulse.  There is no overlying skin changes over the posterior scalp neck or shoulders including erythema, induration, warmth, fluctuance or other skin changes.  Her bilateral shoulders have no effusion.  She is very tender over her right trapezius muscle and right-sided occipital scalp muscles.  She describes an electric shocklike sensation whenever she twists her head.  Or tilted to the right.  No tenderness over the C/T or L-spine. ____________________________________________   LABS (all labs ordered are listed, but only abnormal results are displayed)  Labs Reviewed  BASIC METABOLIC PANEL - Abnormal; Notable for the following components:      Result Value   Sodium 134 (*)    Glucose, Bld 227 (*)    All other components within normal limits  CBC  TROPONIN I (HIGH SENSITIVITY)  TROPONIN I (HIGH SENSITIVITY)   ____________________________________________  EKG  Sinus rhythm with a ventricular of 79, normal axis, unremarkable intervals, nonspecific ST change in V2 without other clear evidence of acute ischemia or significant arrhythmia. ____________________________________________  RADIOLOGY  ED MD interpretation: Right hemidiaphragm slightly elevated.  No overt consolidation, pneumothorax, large effusion,or other significant arrangement.  Official radiology report(s): DG Chest 2 View  Result Date: 03/13/2020 CLINICAL DATA:  Neck pain hypertension EXAM: CHEST - 2 VIEW COMPARISON:  June 03, 2016 FINDINGS: The heart size and mediastinal contours are within normal limits. Again noted is elevation of the right hemidiaphragm. Both lungs are clear. The visualized skeletal structures are unremarkable. IMPRESSION: No active cardiopulmonary disease. Electronically Signed   By: Prudencio Pair M.D.   On: 03/13/2020  00:47   CT Cervical Spine Wo Contrast  Result Date: 03/13/2020 CLINICAL DATA:  Chronic neck pain EXAM: CT CERVICAL SPINE WITHOUT CONTRAST TECHNIQUE: Multidetector CT imaging of the cervical spine was performed without intravenous contrast. Multiplanar  CT image reconstructions were also generated. COMPARISON:  None. FINDINGS: Alignment: S-shaped scoliosis of the cervical spine. No evidence of acute vertebral body subluxation. Skull base and vertebrae: No fracture line or displaced fracture fragment. No acute or suspicious osseous lesion. Soft tissues and spinal canal: No prevertebral fluid or swelling. No visible canal hematoma. Disc levels: C2-3: Mild disc-osteophytic bulge. No significant central canal or neural foramen stenosis. C3-4: Mild disc-osteophytic bulge and degenerative hypertrophy of the uncovertebral and RIGHT facet joints, causing moderate LEFT neural foramen stenosis and mild RIGHT neural foramen stenosis. No significant central canal stenosis. C4-5: Mild disc-osteophytic bulge and mild degenerative hypertrophy of the posterior facets and uncovertebral joints, causing mild LEFT neural foramen stenosis. No significant central canal or RIGHT neural foramen stenosis. C5-6: Diffuse disc-osteophytic bulge and mild degenerative hypertrophy of the uncovertebral and facet joints, causing mild bilateral neural foramen stenoses and mild central canal stenosis. C6-7: Diffuse disc-osteophytic bulge and degenerative hypertrophy of the uncovertebral and facet joints causing moderate to severe LEFT neural foramen stenosis and moderate central canal stenosis with probable flattening of the cervical cord. Mild RIGHT neural foramen stenosis. C7-T1: Mild disc-osteophytic bulge and degenerative hypertrophy of the uncovertebral joints causing mild LEFT neural foramen stenosis and mild central canal stenosis. Upper chest: No acute findings. Incidental note made of a normal variant takeoff of the RIGHT subclavian artery  via the proximal descending thoracic aorta. Other: Bilateral carotid atherosclerosis. IMPRESSION: 1. No acute findings within the cervical spine. 2. S-shaped scoliosis of the cervical spine. 3. Multilevel disc-osteophytic bulge and degenerative hypertrophy of the uncovertebral and facet joints, most prominent at C6-7 where there is moderate to severe LEFT neural foramen stenosis and moderate central canal stenosis at C6-7 with probable flattening of the cervical cord. 4. Additional mild neural foramen stenoses bilaterally at C3-4 through C7-T1. 5. Bilateral carotid atherosclerosis. Aortic Atherosclerosis (ICD10-I70.0). Electronically Signed   By: Franki Cabot M.D.   On: 03/13/2020 09:37    ____________________________________________   PROCEDURES  Procedure(s) performed (including Critical Care):  Procedures   ____________________________________________   INITIAL IMPRESSION / ASSESSMENT AND PLAN / ED COURSE        Patient presents with [place] to history exam for assessment of some right-sided shoulder pain and right-sided neck pain that began yesterday.  It is seemingly nontraumatic.  Patient is afebrile slightly hypertensive on arrival.  Otherwise a stable vital signs.  She is neurovascular intact in bilateral upper extremities and has some tenderness over her right trapezius muscles and right occipital muscles but no point tenderness step-offs or deformities over the C-spine T or L-spine.  No overlying skin changes that would suggest an acute infectious process.  BMP remarkable for glucose of 227 consistent with patient's known history of diabetes with no evidence of acidosis or significant electrolyte or metabolic derangements.  CBC unremarkable.  Troponins are nonelevated x2/2 hours.  Chest x-ray shows no evidence of pneumothorax, pneumonia, rib fracture, proximal humerus fracture or shoulder dislocation or other fracture at the shoulder joint.  CT C-spine shows no evidence of acute  fracture or dislocation.  However patient is noted to have fairly severe and diffuse degenerative changes including some significant bilateral foraminal stenosis and disc bulging as well as diffuse osteophytes.  I discussed with on-call radiologist Dr. Ena Dawley comment about probable flattening of the cervical cord and he stated this was chronic and there was no indication this was acute.  Given no acute findings on CT C-spine with impression of cervical radiculopathy and associated muscle spasm  patient neurovascularly intact in stating she had significant provement after lidocaine patch was applied and given Tylenol I believe she is safe for discharge.  Advised patient that her blood sugar was elevated today and that this should be rechecked by her PCP in the next couple days.   Discharged in stable condition.  Strict return precautions advised discussed.  ____________________________________________   FINAL CLINICAL IMPRESSION(S) / ED DIAGNOSES  Final diagnoses:  Cervical radiculopathy  Muscle spasm  Hyperglycemia  Atherosclerosis    Medications  lidocaine (LIDODERM) 5 % 1 patch (1 patch Transdermal Patch Applied 03/13/20 0852)  amLODipine (NORVASC) tablet 5 mg (5 mg Oral Given 03/13/20 0853)  carvedilol (COREG) tablet 6.25 mg (6.25 mg Oral Given 03/13/20 0853)  metFORMIN (GLUCOPHAGE-XR) 24 hr tablet 1,500 mg (has no administration in time range)  acetaminophen (TYLENOL) tablet 1,000 mg (1,000 mg Oral Given 03/13/20 0853)  gabapentin (NEURONTIN) capsule 100 mg (100 mg Oral Given 03/13/20 5643)     ED Discharge Orders    None       Note:  This document was prepared using Dragon voice recognition software and may include unintentional dictation errors.   Lucrezia Starch, MD 03/13/20 [phone]

## 2020-03-13 NOTE — Discharge Instructions (Addendum)
Your CT Neck showed: IMPRESSION: 1. No acute findings within the cervical spine. 2. S-shaped scoliosis of the cervical spine. 3. Multilevel disc-osteophytic bulge and degenerative hypertrophy of the uncovertebral and facet joints, most prominent at C6-7 where there is moderate to severe LEFT neural foramen stenosis and moderate central canal stenosis at C6-7 with probable flattening of the cervical cord. 4. Additional mild neural foramen stenoses bilaterally at C3-4 through C7-T1. 5. Bilateral carotid atherosclerosis.   Aortic Atherosclerosis (ICD10-I70.0).  This is consistent with severe degenerative changes and arthritis in your neck which I suspect is likely a significant contributing factor to your symptoms.

## 2020-03-23 DIAGNOSIS — M5030 Other cervical disc degeneration, unspecified cervical region: Secondary | ICD-10-CM | POA: Insufficient documentation

## 2020-12-17 ENCOUNTER — Other Ambulatory Visit: Payer: Self-pay | Admitting: Internal Medicine

## 2020-12-20 ENCOUNTER — Encounter: Payer: Self-pay | Admitting: Internal Medicine

## 2020-12-20 ENCOUNTER — Ambulatory Visit (INDEPENDENT_AMBULATORY_CARE_PROVIDER_SITE_OTHER): Payer: Medicare Other | Admitting: Internal Medicine

## 2020-12-20 ENCOUNTER — Other Ambulatory Visit: Payer: Self-pay

## 2020-12-20 VITALS — BP 118/72 | HR 81 | Temp 98.0°F | Ht 64.0 in | Wt 166.0 lb

## 2020-12-20 DIAGNOSIS — E785 Hyperlipidemia, unspecified: Secondary | ICD-10-CM | POA: Diagnosis not present

## 2020-12-20 DIAGNOSIS — I700 Atherosclerosis of aorta: Secondary | ICD-10-CM | POA: Diagnosis not present

## 2020-12-20 DIAGNOSIS — E1169 Type 2 diabetes mellitus with other specified complication: Secondary | ICD-10-CM | POA: Diagnosis not present

## 2020-12-20 DIAGNOSIS — K219 Gastro-esophageal reflux disease without esophagitis: Secondary | ICD-10-CM | POA: Diagnosis not present

## 2020-12-20 DIAGNOSIS — E118 Type 2 diabetes mellitus with unspecified complications: Secondary | ICD-10-CM

## 2020-12-20 DIAGNOSIS — E538 Deficiency of other specified B group vitamins: Secondary | ICD-10-CM | POA: Diagnosis not present

## 2020-12-20 DIAGNOSIS — K7460 Unspecified cirrhosis of liver: Secondary | ICD-10-CM | POA: Diagnosis not present

## 2020-12-20 DIAGNOSIS — Z23 Encounter for immunization: Secondary | ICD-10-CM

## 2020-12-20 DIAGNOSIS — I10 Essential (primary) hypertension: Secondary | ICD-10-CM

## 2020-12-20 MED ORDER — SHINGRIX 50 MCG/0.5ML IM SUSR: 0.5000 mL | Freq: Once | INTRAMUSCULAR | 1 refills | Status: AC

## 2020-12-20 NOTE — Patient Instructions (Addendum)
Cut the amlodipine in half - take only 1/2 per day.  Elevate your legs when you can.  I think you itch from Dry Skin.  Use a moisturizing lotion like Cerave.

## 2020-12-20 NOTE — Progress Notes (Signed)
Date:  12/20/2020   Name:  Alison Warren   DOB:  Jun 27, 1945   MRN:  [id number]   Chief Complaint: Establish Care, Rash (Pt states she itches all over sometimes, comes and goes, and moles are popping up, small and brown), and Leg Swelling (X4 months, Swelling from calf muscle on down to feet, both feet)  Rash This is a recurrent problem. She was exposed to nothing. Pertinent negatives include no cough, fatigue, fever or shortness of breath.  Diabetes She presents for her follow-up diabetic visit. She has type 2 diabetes mellitus. Her disease course has been stable. Pertinent negatives for hypoglycemia include no headaches, nervousness/anxiousness or tremors. Pertinent negatives for diabetes include no chest pain, no fatigue, no polydipsia and no polyuria. Current diabetic treatment includes oral agent (monotherapy). She is compliant with treatment all of the time. She is following a generally healthy diet. An ACE inhibitor/angiotensin II receptor blocker is not being taken. Eye exam is current.  Hyperlipidemia This is a chronic problem. The problem is controlled. Pertinent negatives include no chest pain or shortness of breath. Current antihyperlipidemic treatment includes ezetimibe. There are no compliance problems.   Gastroesophageal Reflux She complains of heartburn. She reports no abdominal pain, no chest pain or no coughing. This is a recurrent problem. The problem occurs occasionally. The heartburn is located in the substernum. The heartburn does not wake her from sleep. Pertinent negatives include no fatigue. She has tried a PPI for the symptoms. The treatment provided significant relief.  Hypertension This is a chronic problem. The problem is controlled. Pertinent negatives include no chest pain, headaches, palpitations or shortness of breath. Past treatments include calcium channel blockers and beta blockers.   Lab Results  Component Value Date   CREATININE 0.76 03/13/2020   BUN 11  03/13/2020   NA 134 (L) 03/13/2020   K 4.0 03/13/2020   CL 99 03/13/2020   CO2 23 03/13/2020   No results found for: CHOL, HDL, LDLCALC, LDLDIRECT, TRIG, CHOLHDL No results found for: TSH No results found for: HGBA1C Lab Results  Component Value Date   WBC 8.7 03/13/2020   HGB 14.6 03/13/2020   HCT 45.9 03/13/2020   MCV 90.9 03/13/2020   PLT 218 03/13/2020   Lab Results  Component Value Date   ALT 40 08/20/2016   AST 36 08/20/2016   ALKPHOS 79 08/20/2016   BILITOT 0.3 08/20/2016     Review of Systems  Constitutional:  Negative for appetite change, fatigue, fever and unexpected weight change.  HENT:  Negative for tinnitus and trouble swallowing.   Eyes:  Negative for visual disturbance.  Respiratory:  Negative for cough, chest tightness and shortness of breath.   Cardiovascular:  Negative for chest pain, palpitations and leg swelling.  Gastrointestinal:  Positive for heartburn. Negative for abdominal pain.  Endocrine: Negative for polydipsia and polyuria.  Genitourinary:  Negative for dysuria and hematuria.  Musculoskeletal:  Negative for arthralgias.  Skin:  Positive for rash.  Neurological:  Negative for tremors, numbness and headaches.  Psychiatric/Behavioral:  Positive for dysphoric mood (mild sx but does not want medication). Negative for sleep disturbance. The patient is not nervous/anxious.    Patient Active Problem List   Diagnosis Date Noted   Degenerative disc disease, cervical 03/23/2020   Chronic bilateral low back pain with bilateral sciatica 02/12/2020   Lumbar spondylosis with myelopathy 02/09/2020   Aortic atherosclerosis ([place]) 01/19/2020   Cardiomyopathy ([place]) 11/21/2018   GERD (gastroesophageal reflux disease) 11/21/2018  Hyperlipidemia associated with type 2 diabetes mellitus ([place]) 11/21/2018   Localized osteoarthritis of left knee 05/09/2018   B12 deficiency 04/30/2018   Obesity (BMI 30.0-34.9) 09/14/2017   Hepatic steatosis 02/26/2017    Diverticulitis of colon 09/22/2016   Vitamin D deficiency 09/01/2016   Type II diabetes mellitus with complication ([place]) [id number]   Hx of adenomatous colonic polyps 02/24/2016   Cirrhosis of liver without ascites ([place]) 12/16/2015   Osteopenia of multiple sites 09/09/2015   Seasonal allergic rhinitis due to pollen 08/24/2015   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/2015   High risk medication use 02/23/2015   Abnormal mammogram with microcalcification 07/02/2014   Muscle cramp, nocturnal 08/20/2013   Hearing loss of both ears 08/19/2013    Allergies  Allergen Reactions   Duloxetine Nausea And Vomiting   Tramadol Nausea And Vomiting   Motrin [Ibuprofen] Rash    Past Surgical History:  Procedure Laterality Date   CATARACT EXTRACTION W/PHACO Right 12/07/2014   Procedure: CATARACT EXTRACTION PHACO AND INTRAOCULAR LENS PLACEMENT ([place]);  Surgeon: Ronnell Freshwater, MD;  Location: [place];  Service: Ophthalmology;  Laterality: Right;  DIABETIC - oral meds   CHOLECYSTECTOMY  2007   COLONOSCOPY N/A 02/08/2016   Procedure: COLONOSCOPY;  Surgeon: Lollie Sails, MD;  Location: [facility] ENDOSCOPY;  Service: Endoscopy;  Laterality: N/A;  Diabetes   MASTOIDECTOMY     THUMB AMPUTATION     removal of 2nd thumb   TONSILLECTOMY      Social History   Tobacco Use   Smoking status: Never   Smokeless tobacco: Never  Vaping Use   Vaping Use: Never used  Substance Use Topics   Alcohol use: No   Drug use: No     Medication list has been reviewed and updated.  Current Meds  Medication Sig   acetaminophen (TYLENOL) 500 MG tablet Take 500 mg by mouth every 6 (six) hours as needed for moderate pain, fever or headache.    amLODipine (NORVASC) 10 MG tablet Take 10 mg by mouth daily.   aspirin 81 MG tablet Take 81 mg by mouth daily. AM   carvedilol (COREG) 3.125 MG tablet Take 6.25 mg by mouth 2 (two) times daily with a meal.    docusate sodium (COLACE) 100 MG capsule Take 1  capsule (100 mg total) by mouth 2 (two) times daily.   ezetimibe (ZETIA) 10 MG tablet Take by mouth.   glucose blood (ONETOUCH ULTRA) test strip USE AS DIRECTED DAILY   metFORMIN (GLUCOPHAGE-XR) 500 MG 24 hr tablet Take 2,000 mg by mouth daily.   omeprazole (PRILOSEC) 40 MG capsule Take 40 mg by mouth daily.   OneTouch Delica Lancets [id number] MISC See admin instructions.    PHQ 2/9 Scores 12/20/2020  PHQ - 2 Score 2  PHQ- 9 Score 9    GAD 7 : Generalized Anxiety Score 12/20/2020  Nervous, Anxious, on Edge 1  Control/stop worrying 1  Worry too much - different things 1  Trouble relaxing 1  Restless 1  Easily annoyed or irritable 1  Afraid - awful might happen 1  Total GAD 7 Score 7    BP Readings from Last 3 Encounters:  12/20/20 118/72  03/13/20 (!) 155/96  12/14/18 124/75    Physical Exam Vitals and nursing note reviewed.  Constitutional:      General: She is not in acute distress.    Appearance: Normal appearance. She is well-developed.  HENT:     Head: Normocephalic and atraumatic.  Neck:  Vascular: No carotid bruit.  Cardiovascular:     Rate and Rhythm: Normal rate and regular rhythm.     Pulses: Normal pulses.  Pulmonary:     Effort: Pulmonary effort is normal. No respiratory distress.     Breath sounds: No wheezing or rhonchi.  Musculoskeletal:        General: Deformity (congenital deformity of right foot) present.     Cervical back: Normal range of motion.     Right lower leg: Edema present.     Left lower leg: Edema present.  Lymphadenopathy:     Cervical: No cervical adenopathy.  Skin:    General: Skin is warm and dry.     Capillary Refill: Capillary refill takes less than 2 seconds.     Findings: No rash.  Neurological:     General: No focal deficit present.     Mental Status: She is alert and oriented to person, place, and time.  Psychiatric:        Mood and Affect: Mood normal.        Behavior: Behavior normal.    Wt Readings from Last 3  Encounters:  12/20/20 166 lb (75.3 kg)  03/13/20 170 lb (77.1 kg)  12/14/18 170 lb (77.1 kg)    BP 118/72   Pulse 81   Temp 98 F (36.7 C) (Oral)   Ht '5\' 4"'$  (1.626 m)   Wt 166 lb (75.3 kg)   SpO2 95%   BMI 28.49 kg/m   Assessment and Plan: 1. Type II diabetes mellitus with complication (HCC) Clinically stable by exam and report without s/s of hypoglycemia. DM complicated by hypertension and dyslipidemia. Tolerating medications well without side effects or other concerns. - Comprehensive metabolic panel - Hemoglobin A1c  2. Hyperlipidemia associated with type 2 diabetes mellitus ([place]) Recently started Zetia.  No side effects. - Lipid panel  3. Aortic atherosclerosis ([place]) Now on Zetia  4. Gastroesophageal reflux disease, unspecified whether esophagitis present Symptoms well controlled on daily PPI No red flag signs such as weight loss, n/v, melena Will continue omeprazole. - CBC with Differential/Platelet  5. Cirrhosis of liver without ascites, unspecified hepatic cirrhosis type ([place]) Last CT in 2021 showed no hepatoma. Followed by [facility]. No hx of alcohol use. No family hx of cirrhosis. - Comprehensive metabolic panel  6. B12 deficiency Not currently on supplement - Vitamin B12  7. Benign essential hypertension Clinically stable exam with well controlled BP. Having edema on amlodipine 10 mg. Will cut dose in half, elevate legs and follow up in 3 months.   Partially dictated using Editor, commissioning. Any errors are unintentional.  Halina Maidens, MD [facility]  12/20/2020

## 2020-12-21 LAB — LIPID PANEL
Chol/HDL Ratio: 4.1 ratio (ref 0.0–4.4)
Cholesterol, Total: 207 mg/dL — ABNORMAL HIGH (ref 100–199)
HDL: 50 mg/dL (ref >39)
LDL Chol Calc (NIH): 97 mg/dL (ref 0–99)
Triglycerides: 362 mg/dL — ABNORMAL HIGH (ref 0–149)
VLDL Cholesterol Cal: 60 mg/dL — ABNORMAL HIGH (ref 5–40)

## 2020-12-21 LAB — COMPREHENSIVE METABOLIC PANEL
ALT: 36 IU/L — ABNORMAL HIGH (ref 0–32)
AST: 41 IU/L — ABNORMAL HIGH (ref 0–40)
Albumin/Globulin Ratio: 1.5 (ref 1.2–2.2)
Albumin: 4.6 g/dL (ref 3.7–4.7)
Alkaline Phosphatase: 117 IU/L (ref 44–121)
BUN/Creatinine Ratio: 12 (ref 12–28)
BUN: 12 mg/dL (ref 8–27)
Bilirubin Total: 0.3 mg/dL (ref 0.0–1.2)
CO2: 24 mmol/L (ref 20–29)
Calcium: 10.3 mg/dL (ref 8.7–10.3)
Chloride: 98 mmol/L (ref 96–106)
Creatinine, Ser: 1.01 mg/dL — ABNORMAL HIGH (ref 0.57–1.00)
Globulin, Total: 3.1 g/dL (ref 1.5–4.5)
Glucose: 178 mg/dL — ABNORMAL HIGH (ref 65–99)
Potassium: 4.8 mmol/L (ref 3.5–5.2)
Sodium: 139 mmol/L (ref 134–144)
Total Protein: 7.7 g/dL (ref 6.0–8.5)
eGFR: 58 mL/min/{1.73_m2} — ABNORMAL LOW (ref >59)

## 2020-12-21 LAB — HEMOGLOBIN A1C
Est. average glucose Bld gHb Est-mCnc: 166 mg/dL
Hgb A1c MFr Bld: 7.4 % — ABNORMAL HIGH (ref 4.8–5.6)

## 2020-12-21 LAB — CBC WITH DIFFERENTIAL/PLATELET
Basophils Absolute: 0.1 10*3/uL (ref 0.0–0.2)
Basos: 1 %
EOS (ABSOLUTE): 0.1 10*3/uL (ref 0.0–0.4)
Eos: 2 %
Hematocrit: 44.9 % (ref 34.0–46.6)
Hemoglobin: 14.6 g/dL (ref 11.1–15.9)
Immature Grans (Abs): 0 10*3/uL (ref 0.0–0.1)
Immature Granulocytes: 0 %
Lymphocytes Absolute: 2.4 10*3/uL (ref 0.7–3.1)
Lymphs: 31 %
MCH: 28.5 pg (ref 26.6–33.0)
MCHC: 32.5 g/dL (ref 31.5–35.7)
MCV: 88 fL (ref 79–97)
Monocytes Absolute: 0.7 10*3/uL (ref 0.1–0.9)
Monocytes: 9 %
Neutrophils Absolute: 4.6 10*3/uL (ref 1.4–7.0)
Neutrophils: 57 %
Platelets: 274 10*3/uL (ref 150–450)
RBC: 5.12 x10E6/uL (ref 3.77–5.28)
RDW: 13.7 % (ref 11.7–15.4)
WBC: 8 10*3/uL (ref 3.4–10.8)

## 2020-12-21 LAB — VITAMIN B12: Vitamin B-12: 397 pg/mL (ref 232–1245)

## 2020-12-24 NOTE — Progress Notes (Signed)
Called pt left VM to call back. ? ?PEC nurse may give results to patient if they return call to clinic, a CRM has been created. ? ?KP

## 2021-02-04 ENCOUNTER — Encounter: Payer: Self-pay | Admitting: Internal Medicine

## 2021-02-04 ENCOUNTER — Ambulatory Visit: Payer: Self-pay

## 2021-02-04 ENCOUNTER — Ambulatory Visit (INDEPENDENT_AMBULATORY_CARE_PROVIDER_SITE_OTHER): Payer: Medicare Other | Admitting: Internal Medicine

## 2021-02-04 ENCOUNTER — Other Ambulatory Visit: Payer: Self-pay

## 2021-02-04 VITALS — BP 120/70 | HR 77 | Temp 97.8°F | Ht 64.0 in | Wt 162.0 lb

## 2021-02-04 DIAGNOSIS — M545 Low back pain, unspecified: Secondary | ICD-10-CM | POA: Diagnosis not present

## 2021-02-04 DIAGNOSIS — R1031 Right lower quadrant pain: Secondary | ICD-10-CM

## 2021-02-04 LAB — POCT URINALYSIS DIPSTICK
Bilirubin, UA: NEGATIVE
Blood, UA: NEGATIVE
Glucose, UA: NEGATIVE
Ketones, UA: NEGATIVE
Leukocytes, UA: NEGATIVE
Nitrite, UA: NEGATIVE
Protein, UA: NEGATIVE
Spec Grav, UA: >=1.03 — AB (ref 1.010–1.025)
Urobilinogen, UA: 0.2 E.U./dL
pH, UA: 6 (ref 5.0–8.0)

## 2021-02-04 MED ORDER — ONDANSETRON HCL 4 MG PO TABS: 4.0000 mg | ORAL_TABLET | Freq: Three times a day (TID) | ORAL | 0 refills | Status: DC | PRN

## 2021-02-04 MED ORDER — AMOXICILLIN-POT CLAVULANATE 875-125 MG PO TABS: 1.0000 | ORAL_TABLET | Freq: Two times a day (BID) | ORAL | 0 refills | Status: AC

## 2021-02-04 NOTE — Telephone Encounter (Signed)
Pt. Reports 2 days of diarrhea and back pain. No vomiting. Has abdominal pain as well. "Sometimes it will double me up." No other symptoms. PEC made appointment for today.      Answer Assessment - Initial Assessment Questions 1. DIARRHEA SEVERITY: "How bad is the diarrhea?" "How many more stools have you had in the past 24 hours than normal?"    - NO DIARRHEA (SCALE 0)   - MILD (SCALE 1-3): Few loose or mushy BMs; increase of 1-3 stools over normal daily number of stools; mild increase in ostomy output.   -  MODERATE (SCALE 4-7): Increase of 4-6 stools daily over normal; moderate increase in ostomy output. * SEVERE (SCALE 8-10; OR 'WORST POSSIBLE'): Increase of 7 or more stools daily over normal; moderate increase in ostomy output; incontinence.     4-7 2. ONSET: "When did the diarrhea begin?"      2 days ago 3. BM CONSISTENCY: "How loose or watery is the diarrhea?"      Watery 4. VOMITING: "Are you also vomiting?" If Yes, ask: "How many times in the past 24 hours?"      No 5. ABDOMINAL PAIN: "Are you having any abdominal pain?" If Yes, ask: "What does it feel like?" (e.g., crampy, dull, intermittent, constant)      Yes 6. ABDOMINAL PAIN SEVERITY: If present, ask: "How bad is the pain?"  (e.g., Scale 1-10; mild, moderate, or severe)   - MILD (1-3): doesn't interfere with normal activities, abdomen soft and not tender to touch    - MODERATE (4-7): interferes with normal activities or awakens from sleep, abdomen tender to touch    - SEVERE (8-10): excruciating pain, doubled over, unable to do any normal activities       Severe 7. ORAL INTAKE: If vomiting, "Have you been able to drink liquids?" "How much liquids have you had in the past 24 hours?"     Yes 8. HYDRATION: "Any signs of dehydration?" (e.g., dry mouth [not just dry lips], too weak to stand, dizziness, new weight loss) "When did you last urinate?"     No 9. EXPOSURE: "Have you traveled to a foreign country recently?" "Have you  been exposed to anyone with diarrhea?" "Could you have eaten any food that was spoiled?"     No 10. ANTIBIOTIC USE: "Are you taking antibiotics now or have you taken antibiotics in the past 2 months?"       No 11. OTHER SYMPTOMS: "Do you have any other symptoms?" (e.g., fever, blood in stool)       No 12. PREGNANCY: "Is there any chance you are pregnant?" "When was your last menstrual period?"       No  Protocols used: [facility]

## 2021-02-04 NOTE — Progress Notes (Signed)
Date:  02/04/2021   Name:  Alison Warren   DOB:  [birth year]   MRN:  [id number]   Chief Complaint: Diarrhea (Nausea. 3 -4 days. 10 pain level on right side of navel and into side. Tried Nsaids and tylenol and no relief. ) Three days of RLQ abdominal pain that comes in waves and feels like a twisting pain.  Yesterday had some nausea but no vomiting.  Today had watery non bloody diarrhea.  Diarrhea  This is a new problem. The current episode started today. The problem occurs 2 to 4 times per day. The stool consistency is described as Watery. The patient states that diarrhea does not awaken her from sleep. Associated symptoms include abdominal pain. Pertinent negatives include no bloating, chills, fever, headaches, sweats or vomiting.  Abdominal Pain This is a new problem. Episode onset: three days ago. The problem has been gradually worsening. The pain is located in the RLQ. The pain is moderate. The quality of the pain is cramping. The abdominal pain radiates to the right flank. Associated symptoms include diarrhea and nausea. Pertinent negatives include no constipation, fever, headaches, hematochezia, hematuria or vomiting. Nothing aggravates the pain.    Lab Results  Component Value Date   CREATININE 1.01 (H) 12/20/2020   BUN 12 12/20/2020   NA 139 12/20/2020   K 4.8 12/20/2020   CL 98 12/20/2020   CO2 24 12/20/2020   Lab Results  Component Value Date   CHOL 207 (H) 12/20/2020   HDL 50 12/20/2020   LDLCALC 97 12/20/2020   TRIG 362 (H) 12/20/2020   CHOLHDL 4.1 12/20/2020   No results found for: TSH Lab Results  Component Value Date   HGBA1C 7.4 (H) 12/20/2020   Lab Results  Component Value Date   WBC 8.0 12/20/2020   HGB 14.6 12/20/2020   HCT 44.9 12/20/2020   MCV 88 12/20/2020   PLT 274 12/20/2020   Lab Results  Component Value Date   ALT 36 (H) 12/20/2020   AST 41 (H) 12/20/2020   ALKPHOS 117 12/20/2020   BILITOT 0.3 12/20/2020     Review of Systems   Constitutional:  Negative for chills and fever.  Respiratory:  Negative for chest tightness and shortness of breath.   Cardiovascular:  Negative for chest pain.  Gastrointestinal:  Positive for abdominal pain, diarrhea and nausea. Negative for bloating, blood in stool, constipation, hematochezia, rectal pain and vomiting.  Genitourinary:  Negative for hematuria.  Neurological:  Negative for dizziness, light-headedness and headaches.   Patient Active Problem List   Diagnosis Date Noted  . Benign essential hypertension 12/20/2020  . Degenerative disc disease, cervical 03/23/2020  . Chronic bilateral low back pain with bilateral sciatica 02/12/2020  . Lumbar spondylosis with myelopathy 02/09/2020  . Aortic atherosclerosis ([place]) 01/19/2020  . Cardiomyopathy ([place]) 11/21/2018  . GERD (gastroesophageal reflux disease) 11/21/2018  . Hyperlipidemia associated with type 2 diabetes mellitus ([place]) 11/21/2018  . Localized osteoarthritis of left knee 05/09/2018  . B12 deficiency 04/30/2018  . Obesity (BMI 30.0-34.9) 09/14/2017  . Hepatic steatosis 02/26/2017  . Diverticulitis of colon 09/22/2016  . Vitamin D deficiency 09/01/2016  . Type II diabetes mellitus with complication ([place]) 62/22/9798  . Hx of adenomatous colonic polyps 02/24/2016  . NAFLD (nonalcoholic fatty liver disease) 12/16/2015  . Osteopenia of multiple sites 09/09/2015  . Seasonal allergic rhinitis due to pollen 08/24/2015  . 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 High risk medication use 02/23/2015  . Abnormal mammogram with  microcalcification 07/02/2014  . Muscle cramp, nocturnal 08/20/2013  . Hearing loss of both ears 08/19/2013    Allergies  Allergen Reactions  . Duloxetine Nausea And Vomiting  . Tramadol Nausea And Vomiting  . Motrin [Ibuprofen] Rash    Past Surgical History:  Procedure Laterality Date  . CATARACT EXTRACTION W/PHACO Right 12/07/2014   Procedure: CATARACT EXTRACTION PHACO AND INTRAOCULAR LENS  PLACEMENT ([facility]);  Surgeon: Ronnell Freshwater, MD;  Location: [place];  Service: Ophthalmology;  Laterality: Right;  DIABETIC - oral meds  . CHOLECYSTECTOMY  2007  . COLONOSCOPY N/A 02/08/2016   Procedure: COLONOSCOPY;  Surgeon: Lollie Sails, MD;  Location: [facility] ENDOSCOPY;  Service: Endoscopy;  Laterality: N/A;  Diabetes  . MASTOIDECTOMY    . THUMB AMPUTATION     removal of 2nd thumb  . TONSILLECTOMY      Social History   Tobacco Use  . Smoking status: Never  . Smokeless tobacco: Never  Vaping Use  . Vaping Use: Never used  Substance Use Topics  . Alcohol use: No  . Drug use: No     Medication list has been reviewed and updated.  Current Meds  Medication Sig  . acetaminophen (TYLENOL) 500 MG tablet Take 500 mg by mouth every 6 (six) hours as needed for moderate pain, fever or headache.   Marland Kitchen amLODipine (NORVASC) 10 MG tablet Take 10 mg by mouth daily.  Marland Kitchen aspirin 81 MG tablet Take 81 mg by mouth daily. AM  . carvedilol (COREG) 3.125 MG tablet Take 6.25 mg by mouth 2 (two) times daily with a meal.   . docusate sodium (COLACE) 100 MG capsule Take 1 capsule (100 mg total) by mouth 2 (two) times daily.  Marland Kitchen ezetimibe (ZETIA) 10 MG tablet Take by mouth.  Marland Kitchen glucose blood (ONETOUCH ULTRA) test strip USE AS DIRECTED DAILY  . metFORMIN (GLUCOPHAGE-XR) 500 MG 24 hr tablet Take 2,000 mg by mouth daily.  Marland Kitchen omeprazole (PRILOSEC) 40 MG capsule Take 40 mg by mouth daily.  Glory Rosebush Delica Lancets 47Q MISC See admin instructions.    PHQ 2/9 Scores 02/04/2021 12/20/2020  PHQ - 2 Score 2 2  PHQ- 9 Score 9 9    GAD 7 : Generalized Anxiety Score 02/04/2021 12/20/2020  Nervous, Anxious, on Edge 1 1  Control/stop worrying 1 1  Worry too much - different things 1 1  Trouble relaxing 1 1  Restless 1 1  Easily annoyed or irritable 1 1  Afraid - awful might happen 1 1  Total GAD 7 Score 7 7  Anxiety Difficulty Not difficult at all -    BP Readings from Last 3  Encounters:  02/04/21 120/70  12/20/20 118/72  03/13/20 (!) 155/96    Physical Exam Vitals and nursing note reviewed.  Constitutional:      General: She is not in acute distress.    Appearance: Normal appearance. She is well-developed. She is ill-appearing.  HENT:     Head: Normocephalic and atraumatic.  Cardiovascular:     Rate and Rhythm: Normal rate and regular rhythm.     Pulses: Normal pulses.  Pulmonary:     Effort: Pulmonary effort is normal. No respiratory distress.     Breath sounds: No wheezing or rhonchi.  Abdominal:     General: Bowel sounds are normal. There is no distension.     Palpations: Abdomen is soft. There is no hepatomegaly, splenomegaly or mass.     Tenderness: There is abdominal tenderness in the right  lower quadrant. There is no right CVA tenderness, left CVA tenderness, guarding or rebound.  Musculoskeletal:     Cervical back: Normal range of motion.  Lymphadenopathy:     Cervical: No cervical adenopathy.  Skin:    General: Skin is warm and dry.     Findings: No rash.  Neurological:     Mental Status: She is alert and oriented to person, place, and time.  Psychiatric:        Mood and Affect: Mood normal.        Behavior: Behavior normal.    Wt Readings from Last 3 Encounters:  02/04/21 162 lb (73.5 kg)  12/20/20 166 lb (75.3 kg)  03/13/20 170 lb (77.1 kg)    BP 120/70   Pulse 77   Temp 97.8 F (36.6 C) (Oral)   Ht 5\' 4"  (1.626 m)   Wt 162 lb (73.5 kg)   SpO2 97%   BMI 27.81 kg/m   Assessment and Plan: 1. Continuous RLQ abdominal pain Abdominal exam is benign except for pain. UA negative. She appears moderately uncomfortable and is hesitant to go to the ED Will treat with Augmentin and Zofran, check CBC If her pain worsens or she is unable to keep down meds and fluid she should go to the ED for further evaluation. - CBC with Differential/Platelet - amoxicillin-clavulanate (AUGMENTIN) 875-125 MG tablet; Take 1 tablet by mouth 2  (two) times daily for 10 days.  Dispense: 20 tablet; Refill: 0 - ondansetron (ZOFRAN) 4 MG tablet; Take 1 tablet (4 mg total) by mouth every 8 (eight) hours as needed for nausea or vomiting.  Dispense: 20 tablet; Refill: 0  2. Acute bilateral low back pain without sciatica - POCT Urinalysis Dipstick   Partially dictated using Editor, commissioning. Any errors are unintentional.  Halina Maidens, MD [facility]  02/04/2021

## 2021-02-05 LAB — CBC WITH DIFFERENTIAL/PLATELET
Basophils Absolute: 0.1 10*3/uL (ref 0.0–0.2)
Basos: 1 %
EOS (ABSOLUTE): 0.2 10*3/uL (ref 0.0–0.4)
Eos: 2 %
Hematocrit: 45.3 % (ref 34.0–46.6)
Hemoglobin: 14.6 g/dL (ref 11.1–15.9)
Immature Grans (Abs): 0 10*3/uL (ref 0.0–0.1)
Immature Granulocytes: 0 %
Lymphocytes Absolute: 3 10*3/uL (ref 0.7–3.1)
Lymphs: 39 %
MCH: 28.3 pg (ref 26.6–33.0)
MCHC: 32.2 g/dL (ref 31.5–35.7)
MCV: 88 fL (ref 79–97)
Monocytes Absolute: 0.5 10*3/uL (ref 0.1–0.9)
Monocytes: 6 %
Neutrophils Absolute: 4 10*3/uL (ref 1.4–7.0)
Neutrophils: 52 %
Platelets: 266 10*3/uL (ref 150–450)
RBC: 5.15 x10E6/uL (ref 3.77–5.28)
RDW: 13.9 % (ref 11.7–15.4)
WBC: 7.8 10*3/uL (ref 3.4–10.8)

## 2021-02-07 ENCOUNTER — Telehealth: Payer: Self-pay

## 2021-02-07 NOTE — Telephone Encounter (Signed)
For your information  

## 2021-02-07 NOTE — Telephone Encounter (Signed)
Copied from [place] [id number]. Topic: General - Other >> Feb 07, 2021  9:34 AM Loma [place] wrote: Reason for CRM: Pt has called and is feeling better and is going to finish the med and is drinking  the Gatorade. She really appreciates Dr B and want her to know she is a wonderful dr. No FU needed

## 2021-02-17 ENCOUNTER — Telehealth: Payer: Self-pay | Admitting: Internal Medicine

## 2021-02-17 NOTE — Telephone Encounter (Signed)
Copied from [place] [id number]. Topic: Medicare AWV >> Feb 17, 2021 10:19 AM Cher Nakai R wrote: Left message for patient to call back and schedule Medicare Annual Wellness Visit (AWV) in office.   If unable to come into the office for AWV,  please offer to do virtually or by telephone.  No hx of AWV eligible for AWVI as of 04/10/2009 per [employer]  Please schedule at anytime with [facility] Health Advisor.      40 Minutes appointment   Any questions, please call me at [phone]

## 2021-02-21 ENCOUNTER — Ambulatory Visit (INDEPENDENT_AMBULATORY_CARE_PROVIDER_SITE_OTHER): Payer: Medicare Other

## 2021-02-21 DIAGNOSIS — Z0000 Encounter for general adult medical examination without abnormal findings: Secondary | ICD-10-CM | POA: Diagnosis not present

## 2021-02-21 NOTE — Progress Notes (Signed)
Subjective:   Alison Warren is a 75 y.o. female who presents for an Initial Medicare Annual Wellness Visit.  Virtual Visit via Telephone Note  I connected with  Alison Warren on 02/21/21 at  3:20 PM EST by telephone and verified that I am speaking with the correct person using two identifiers.  Location: Patient: home Provider: [facility] Persons participating in the virtual visit: [place]   I discussed the limitations, risks, security and privacy concerns of performing an evaluation and management service by telephone and the availability of in person appointments. The patient expressed understanding and agreed to proceed.  Interactive audio and video telecommunications were attempted between this nurse and patient, however failed, due to patient having technical difficulties OR patient did not have access to video capability.  We continued and completed visit with audio only.  Some vital signs may be absent or patient reported.   Clemetine Marker, LPN   Review of Systems     Cardiac Risk Factors include: advanced age (>51men, >62 women);diabetes mellitus;dyslipidemia;hypertension     Objective:    There were no vitals filed for this visit. There is no height or weight on file to calculate BMI.  Advanced Directives 02/21/2021 03/13/2020 12/14/2018 08/21/2016 08/20/2016 06/03/2016 02/08/2016  Does Patient Have a Medical Advance Directive? No No Yes No No No No  Would patient like information on creating a medical advance directive? No - Patient declined No - Patient declined - No - Patient declined No - Patient declined No - Patient declined -    Current Medications (verified) Outpatient Encounter Medications as of 02/21/2021  Medication Sig   acetaminophen (TYLENOL) 500 MG tablet Take 500 mg by mouth every 6 (six) hours as needed for moderate pain, fever or headache.    amLODipine (NORVASC) 10 MG tablet Take 10 mg by mouth daily.   aspirin 81 MG tablet Take 81 mg  by mouth daily. AM   carvedilol (COREG) 3.125 MG tablet Take 3.125 mg by mouth 2 (two) times daily with a meal.   ezetimibe (ZETIA) 10 MG tablet Take by mouth.   glucose blood (ONETOUCH ULTRA) test strip USE AS DIRECTED DAILY   metFORMIN (GLUCOPHAGE-XR) 500 MG 24 hr tablet Take 2,000 mg by mouth daily.   omeprazole (PRILOSEC) 40 MG capsule Take 40 mg by mouth daily.   ondansetron (ZOFRAN) 4 MG tablet Take 1 tablet (4 mg total) by mouth every 8 (eight) hours as needed for nausea or vomiting.   OneTouch Delica Lancets 84X MISC See admin instructions.   docusate sodium (COLACE) 100 MG capsule Take 1 capsule (100 mg total) by mouth 2 (two) times daily. (Patient not taking: Reported on 02/21/2021)   No facility-administered encounter medications on file as of 02/21/2021.    Allergies (verified) Duloxetine, Tramadol, and Motrin [ibuprofen]   History: Past Medical History:  Diagnosis Date   Arthritis    Bronchitis    none currently   Chronic cough    Costochondritis    Diabetes mellitus without complication ([place])    8-9 years   Diverticulitis    Diverticulitis 08/2016   GERD (gastroesophageal reflux disease)    Hypercholesteremia    Hypertension    Hypomagnesemia 08/19/2014   Lactic acidosis 08/24/2016   Wears hearing aid    right ear   Past Surgical History:  Procedure Laterality Date   CATARACT EXTRACTION W/PHACO Right 12/07/2014   Procedure: CATARACT EXTRACTION PHACO AND INTRAOCULAR LENS PLACEMENT ([place]);  Surgeon: Ronnell Freshwater, MD;  Location: [facility];  Service: Ophthalmology;  Laterality: Right;  DIABETIC - oral meds   CHOLECYSTECTOMY  2007   COLONOSCOPY N/A 02/08/2016   Procedure: COLONOSCOPY;  Surgeon: Lollie Sails, MD;  Location: [facility] ENDOSCOPY;  Service: Endoscopy;  Laterality: N/A;  Diabetes   MASTOIDECTOMY     THUMB AMPUTATION     removal of 2nd thumb   TONSILLECTOMY     Family History  Problem Relation Age of Onset   Cancer Mother 18        Cervical   Heart attack Father    Diabetes Neg Hx    COPD Neg Hx    Social History   Socioeconomic History   Marital status: Widowed    Spouse name: Not on file   Number of children: 2   Years of education: Not on file   Highest education level: Not on file  Occupational History   Occupation: retired  Tobacco Use   Smoking status: Never   Smokeless tobacco: Never  Vaping Use   Vaping Use: Never used  Substance and Sexual Activity   Alcohol use: No   Drug use: No   Sexual activity: Not Currently  Other Topics Concern   Not on file  Social History Narrative   Pt lives alone   Social Determinants of Health   Financial Resource Strain: Low Risk    Difficulty of Paying Living Expenses: Not hard at all  Food Insecurity: No Food Insecurity   Worried About Charity fundraiser in the Last Year: Never true   Arboriculturist in the Last Year: Never true  Transportation Needs: No Transportation Needs   Lack of Transportation (Medical): No   Lack of Transportation (Non-Medical): No  Physical Activity: Inactive   Days of Exercise per Week: 0 days   Minutes of Exercise per Session: 0 min  Stress: No Stress Concern Present   Feeling of Stress : Not at all  Social Connections: Socially Isolated   Frequency of Communication with Friends and Family: More than three times a week   Frequency of Social Gatherings with Friends and Family: Once a week   Attends Religious Services: Never   Marine scientist or Organizations: No   Attends Archivist Meetings: Never   Marital Status: Widowed    Tobacco Counseling Counseling given: Not Answered   Clinical Intake:  Pre-visit preparation completed: Yes  Pain : No/denies pain     Nutritional Risks: None Diabetes: Yes CBG done?: No Did pt. bring in CBG monitor from home?: No  How often do you need to have someone help you when you read instructions, pamphlets, or other written materials from your doctor or  pharmacy?: 1 - Never  Nutrition Risk Assessment:  Has the patient had any N/V/D within the last 2 months?  Yes  Does the patient have any non-healing wounds?  No  Has the patient had any unintentional weight loss or weight gain?  No   Diabetes:  Is the patient diabetic?  Yes  If diabetic, was a CBG obtained today?  No  Did the patient bring in their glucometer from home?  No  How often do you monitor your CBG's? daily.   Financial Strains and Diabetes Management:  Are you having any financial strains with the device, your supplies or your medication? No .  Does the patient want to be seen by Chronic Care Management for management of their diabetes?  No  Would the patient like  to be referred to a Nutritionist or for Diabetic Management?  No   Diabetic Exams:  Diabetic Eye Exam: Overdue for diabetic eye exam. Pt has been advised about the importance in completing this exam.   Diabetic Foot Exam: Completed 12/20/20.   Interpreter Needed?: No  Information entered by :: Clemetine Marker LPN   Activities of Daily Living In your present state of health, do you have any difficulty performing the following activities: 02/21/2021 12/20/2020  Hearing? N Y  Vision? Y Y  Difficulty concentrating or making decisions? N Y  Walking or climbing stairs? Y Y  Dressing or bathing? N N  Doing errands, shopping? N Y  Conservation officer, nature and eating ? N -  Using the Toilet? N -  In the past six months, have you accidently leaked urine? Y -  Do you have problems with loss of bowel control? N -  Managing your Medications? N -  Managing your Finances? N -  Housekeeping or managing your Housekeeping? N -  Some recent data might be hidden    Patient Care Team: Glean Hess, MD as PCP - General (Internal Medicine) Poggi, Marshall Cork, MD as Consulting Physician (Orthopedic Surgery) Ok Edwards, NP as Nurse Practitioner (Gastroenterology)  Indicate any recent Medical Services you may have  received from other than [facility] providers in the past year (date may be approximate).     Assessment:   This is a routine wellness examination for Eilyn.  Hearing/Vision screen Hearing Screening - Comments:: Pt wears hearing aids maintained by  ENT Vision Screening - Comments:: Annual vision screenings done at [facility]; past due for exam  Dietary issues and exercise activities discussed: Current Exercise Habits: The patient does not participate in regular exercise at present, Exercise limited by: orthopedic condition(s)   Goals Addressed             This Visit's Progress    DIET - INCREASE WATER INTAKE       Recommend drinking 6-8 glasses of water per day        Depression Screen PHQ 2/9 Scores 02/21/2021 02/04/2021 12/20/2020  PHQ - 2 Score 0 2 2  PHQ- 9 Score 1 9 9     Fall Risk Fall Risk  02/21/2021 02/04/2021 12/20/2020  Falls in the past year? 0 0 0  Number falls in past yr: 0 0 0  Injury with Fall? 0 0 0  Risk for fall due to : No Fall Risks No Fall Risks No Fall Risks  Follow up Falls prevention discussed Falls evaluation completed Falls evaluation completed    [place]:  Any stairs in or around the home? Yes  If so, are there any without handrails? No  Home free of loose throw rugs in walkways, pet beds, electrical cords, etc? Yes  Adequate lighting in your home to reduce risk of falls? Yes   ASSISTIVE DEVICES UTILIZED TO PREVENT FALLS:  Life alert? No  Use of a cane, walker or w/c? Yes  Grab bars in the bathroom? Yes  Shower chair or bench in shower? Yes  Elevated toilet seat or a handicapped toilet? No   TIMED UP AND GO:  Was the test performed? No . Telephonic visit  Cognitive Function: Normal cognitive status assessed by direct observation by this Nurse Health Advisor. No abnormalities found.          Immunizations Immunization History  Administered Date(s) Administered   Fluad Quad(high  Dose 65+) 12/20/2020  Influenza Inj Mdck Quad Pf 03/05/2019   Influenza Split 02/20/2014, 02/23/2015   Influenza, High Dose Seasonal PF 02/06/2017, 12/14/2017   Influenza-Unspecified 06/02/2003, 02/19/2013   Moderna Sars-Covid-2 Vaccination 08/18/2019, 09/15/2019   Pneumococcal Conjugate-13 02/24/2015   Pneumococcal Polysaccharide-23 01/09/2008, 09/15/2013    TDAP status: Due, Education has been provided regarding the importance of this vaccine. Advised may receive this vaccine at local pharmacy or Health Dept. Aware to provide a copy of the vaccination record if obtained from local pharmacy or Health Dept. Verbalized acceptance and understanding.  Flu Vaccine status: Up to date  Pneumococcal vaccine status: Up to date  Covid-19 vaccine status: Completed vaccines  Qualifies for Shingles Vaccine? Yes   Zostavax completed No   Shingrix Completed?: No.    Education has been provided regarding the importance of this vaccine. Patient has been advised to call insurance company to determine out of pocket expense if they have not yet received this vaccine. Advised may also receive vaccine at local pharmacy or Health Dept. Verbalized acceptance and understanding.  Screening Tests Health Maintenance  Topic Date Due   OPHTHALMOLOGY EXAM  Never done   URINE MICROALBUMIN  Never done   Hepatitis C Screening  Never done   TETANUS/TDAP  Never done   Zoster Vaccines- Shingrix (1 of 2) Never done   DEXA SCAN  Never done   COVID-19 Vaccine (3 - Moderna risk series) 10/13/2019   HEMOGLOBIN A1C  06/19/2021   FOOT EXAM  12/20/2021   Pneumonia Vaccine 68+ Years old  Completed   INFLUENZA VACCINE  Completed   HPV VACCINES  Aged Out    Health Maintenance  Health Maintenance Due  Topic Date Due   OPHTHALMOLOGY EXAM  Never done   URINE MICROALBUMIN  Never done   Hepatitis C Screening  Never done   TETANUS/TDAP  Never done   Zoster Vaccines- Shingrix (1 of 2) Never done   DEXA SCAN  Never done    COVID-19 Vaccine (3 - Moderna risk series) 10/13/2019    Colorectal cancer screening: Type of screening: Colonoscopy. Completed 02/08/16. Repeat every 5 years. Pt advised due for repeat screening due to hx of adenomatous polyps; pt would like to discuss with Dr. Army Melia regarding repeat screening  Mammogram status: No longer required due to age.  Bone density status: no longer required due to age  Lung Cancer Screening: (Low Dose CT Chest recommended if Age 67-80 years, 30 pack-year currently smoking OR have quit w/in 15years.) does not qualify.    Additional Screening:  Hepatitis C Screening: does qualify; postponed  Vision Screening: Recommended annual ophthalmology exams for early detection of glaucoma and other disorders of the eye. Is the patient up to date with their annual eye exam?  No  Who is the provider or what is the name of the office in which the patient attends annual eye exams? [facility].   Dental Screening: Recommended annual dental exams for proper oral hygiene  Community Resource Referral / Chronic Care Management: CRR required this visit?  No   CCM required this visit?  No      Plan:     I have personally reviewed and noted the following in the patient's chart:   Medical and social history Use of alcohol, tobacco or illicit drugs  Current medications and supplements including opioid prescriptions. Patient is not currently taking opioid prescriptions. Functional ability and status Nutritional status Physical activity Advanced directives List of other physicians Hospitalizations, surgeries, and ER visits in previous  12 months Vitals Screenings to include cognitive, depression, and falls Referrals and appointments  In addition, I have reviewed and discussed with patient certain preventive protocols, quality metrics, and best practice recommendations. A written personalized care plan for preventive services as well as general preventive  health recommendations were provided to patient.     Clemetine Marker, LPN   [id number]   Nurse Notes: none

## 2021-02-21 NOTE — Patient Instructions (Signed)
Alison Warren , Thank you for taking time to come for your Medicare Wellness Visit. I appreciate your ongoing commitment to your health goals. Please review the following plan we discussed and let me know if I can assist you in the future.   Screening recommendations/referrals: Colonoscopy: done 02/08/16; due for repeat colonoscopy Mammogram: no longer required Bone Density: no longer required Recommended yearly ophthalmology/optometry visit for glaucoma screening and checkup Recommended yearly dental visit for hygiene and checkup  Vaccinations: Influenza vaccine: done 12/20/20 Pneumococcal vaccine: done 02/24/15 Tdap vaccine: du Shingles vaccine: Shingrix discussed. Please contact your pharmacy for coverage information.  Covid-19:done 08/18/19 & 09/15/19  Advanced directives: Advance directive discussed with you today. Even though you declined this today please call our office should you change your mind and we can give you the proper paperwork for you to fill out.   Conditions/risks identified: Recommend drinking 6-8 glasses of water per day   Next appointment: Follow up in one year for your annual wellness visit    Preventive Care 65 Years and Older, Female Preventive care refers to lifestyle choices and visits with your health care provider that can promote health and wellness. What does preventive care include? A yearly physical exam. This is also called an annual well check. Dental exams once or twice a year. Routine eye exams. Ask your health care provider how often you should have your eyes checked. Personal lifestyle choices, including: Daily care of your teeth and gums. Regular physical activity. Eating a healthy diet. Avoiding tobacco and drug use. Limiting alcohol use. Practicing safe sex. Taking low-dose aspirin every day. Taking vitamin and mineral supplements as recommended by your health care provider. What happens during an annual well check? The services and  screenings done by your health care provider during your annual well check will depend on your age, overall health, lifestyle risk factors, and family history of disease. Counseling  Your health care provider may ask you questions about your: Alcohol use. Tobacco use. Drug use. Emotional well-being. Home and relationship well-being. Sexual activity. Eating habits. History of falls. Memory and ability to understand (cognition). Work and work Statistician. Reproductive health. Screening  You may have the following tests or measurements: Height, weight, and BMI. Blood pressure. Lipid and cholesterol levels. These may be checked every 5 years, or more frequently if you are over 95 years old. Skin check. Lung cancer screening. You may have this screening every year starting at age 72 if you have a 30-pack-year history of smoking and currently smoke or have quit within the past 15 years. Fecal occult blood test (FOBT) of the stool. You may have this test every year starting at age 45. Flexible sigmoidoscopy or colonoscopy. You may have a sigmoidoscopy every 5 years or a colonoscopy every 10 years starting at age 72. Hepatitis C blood test. Hepatitis B blood test. Sexually transmitted disease (STD) testing. Diabetes screening. This is done by checking your blood sugar (glucose) after you have not eaten for a while (fasting). You may have this done every 1-3 years. Bone density scan. This is done to screen for osteoporosis. You may have this done starting at age 69. Mammogram. This may be done every 1-2 years. Talk to your health care provider about how often you should have regular mammograms. Talk with your health care provider about your test results, treatment options, and if necessary, the need for more tests. Vaccines  Your health care provider may recommend certain vaccines, such as: Influenza vaccine. This is recommended  every year. Tetanus, diphtheria, and acellular pertussis (Tdap,  Td) vaccine. You may need a Td booster every 10 years. Zoster vaccine. You may need this after age 57. Pneumococcal 13-valent conjugate (PCV13) vaccine. One dose is recommended after age 81. Pneumococcal polysaccharide (PPSV23) vaccine. One dose is recommended after age 82. Talk to your health care provider about which screenings and vaccines you need and how often you need them. This information is not intended to replace advice given to you by your health care provider. Make sure you discuss any questions you have with your health care provider. Document Released: 04/23/2015 Document Revised: 12/15/2015 Document Reviewed: 01/26/2015 Elsevier Interactive Patient Education  2017 [place] Prevention in the Home Falls can cause injuries. They can happen to people of all ages. There are many things you can do to make your home safe and to help prevent falls. What can I do on the outside of my home? Regularly fix the edges of walkways and driveways and fix any cracks. Remove anything that might make you trip as you walk through a door, such as a raised step or threshold. Trim any bushes or trees on the path to your home. Use bright outdoor lighting. Clear any walking paths of anything that might make someone trip, such as rocks or tools. Regularly check to see if handrails are loose or broken. Make sure that both sides of any steps have handrails. Any raised decks and porches should have guardrails on the edges. Have any leaves, snow, or ice cleared regularly. Use sand or salt on walking paths during winter. Clean up any spills in your garage right away. This includes oil or grease spills. What can I do in the bathroom? Use night lights. Install grab bars by the toilet and in the tub and shower. Do not use towel bars as grab bars. Use non-skid mats or decals in the tub or shower. If you need to sit down in the shower, use a plastic, non-slip stool. Keep the floor dry. Clean up any  water that spills on the floor as soon as it happens. Remove soap buildup in the tub or shower regularly. Attach bath mats securely with double-sided non-slip rug tape. Do not have throw rugs and other things on the floor that can make you trip. What can I do in the bedroom? Use night lights. Make sure that you have a light by your bed that is easy to reach. Do not use any sheets or blankets that are too big for your bed. They should not hang down onto the floor. Have a firm chair that has side arms. You can use this for support while you get dressed. Do not have throw rugs and other things on the floor that can make you trip. What can I do in the kitchen? Clean up any spills right away. Avoid walking on wet floors. Keep items that you use a lot in easy-to-reach places. If you need to reach something above you, use a strong step stool that has a grab bar. Keep electrical cords out of the way. Do not use floor polish or wax that makes floors slippery. If you must use wax, use non-skid floor wax. Do not have throw rugs and other things on the floor that can make you trip. What can I do with my stairs? Do not leave any items on the stairs. Make sure that there are handrails on both sides of the stairs and use them. Fix handrails that are broken  or loose. Make sure that handrails are as long as the stairways. Check any carpeting to make sure that it is firmly attached to the stairs. Fix any carpet that is loose or worn. Avoid having throw rugs at the top or bottom of the stairs. If you do have throw rugs, attach them to the floor with carpet tape. Make sure that you have a light switch at the top of the stairs and the bottom of the stairs. If you do not have them, ask someone to add them for you. What else can I do to help prevent falls? Wear shoes that: Do not have high heels. Have rubber bottoms. Are comfortable and fit you well. Are closed at the toe. Do not wear sandals. If you use a  stepladder: Make sure that it is fully opened. Do not climb a closed stepladder. Make sure that both sides of the stepladder are locked into place. Ask someone to hold it for you, if possible. Clearly mark and make sure that you can see: Any grab bars or handrails. First and last steps. Where the edge of each step is. Use tools that help you move around (mobility aids) if they are needed. These include: Canes. Walkers. Scooters. Crutches. Turn on the lights when you go into a dark area. Replace any light bulbs as soon as they burn out. Set up your furniture so you have a clear path. Avoid moving your furniture around. If any of your floors are uneven, fix them. If there are any pets around you, be aware of where they are. Review your medicines with your doctor. Some medicines can make you feel dizzy. This can increase your chance of falling. Ask your doctor what other things that you can do to help prevent falls. This information is not intended to replace advice given to you by your health care provider. Make sure you discuss any questions you have with your health care provider. Document Released: 01/21/2009 Document Revised: 09/02/2015 Document Reviewed: 05/01/2014 Elsevier Interactive Patient Education  2017 [employer].

## 2021-03-21 ENCOUNTER — Other Ambulatory Visit: Payer: Self-pay

## 2021-03-21 ENCOUNTER — Emergency Department
Admission: EM | Admit: 2021-03-21 | Discharge: 2021-03-21 | Disposition: A | Discharge status: Home / Self Care | Payer: Medicare Other | Attending: Emergency Medicine | Admitting: Emergency Medicine

## 2021-03-21 ENCOUNTER — Encounter: Payer: Self-pay | Admitting: Internal Medicine

## 2021-03-21 ENCOUNTER — Emergency Department: Payer: Medicare Other

## 2021-03-21 ENCOUNTER — Ambulatory Visit (INDEPENDENT_AMBULATORY_CARE_PROVIDER_SITE_OTHER): Payer: Medicare Other | Admitting: Internal Medicine

## 2021-03-21 ENCOUNTER — Other Ambulatory Visit: Payer: Self-pay | Admitting: Internal Medicine

## 2021-03-21 VITALS — BP 122/78 | HR 69 | Ht 64.0 in | Wt 172.0 lb

## 2021-03-21 DIAGNOSIS — E785 Hyperlipidemia, unspecified: Secondary | ICD-10-CM | POA: Diagnosis not present

## 2021-03-21 DIAGNOSIS — M5412 Radiculopathy, cervical region: Secondary | ICD-10-CM | POA: Diagnosis not present

## 2021-03-21 DIAGNOSIS — I499 Cardiac arrhythmia, unspecified: Secondary | ICD-10-CM | POA: Diagnosis not present

## 2021-03-21 DIAGNOSIS — M62830 Muscle spasm of back: Secondary | ICD-10-CM | POA: Insufficient documentation

## 2021-03-21 DIAGNOSIS — R6889 Other general symptoms and signs: Secondary | ICD-10-CM | POA: Diagnosis not present

## 2021-03-21 DIAGNOSIS — I429 Cardiomyopathy, unspecified: Secondary | ICD-10-CM

## 2021-03-21 DIAGNOSIS — Z79899 Other long term (current) drug therapy: Secondary | ICD-10-CM | POA: Diagnosis not present

## 2021-03-21 DIAGNOSIS — E118 Type 2 diabetes mellitus with unspecified complications: Secondary | ICD-10-CM

## 2021-03-21 DIAGNOSIS — I10 Essential (primary) hypertension: Secondary | ICD-10-CM | POA: Insufficient documentation

## 2021-03-21 DIAGNOSIS — Z743 Need for continuous supervision: Secondary | ICD-10-CM | POA: Diagnosis not present

## 2021-03-21 DIAGNOSIS — I517 Cardiomegaly: Secondary | ICD-10-CM | POA: Diagnosis not present

## 2021-03-21 DIAGNOSIS — E1169 Type 2 diabetes mellitus with other specified complication: Secondary | ICD-10-CM | POA: Insufficient documentation

## 2021-03-21 DIAGNOSIS — M542 Cervicalgia: Secondary | ICD-10-CM

## 2021-03-21 DIAGNOSIS — R059 Cough, unspecified: Secondary | ICD-10-CM | POA: Diagnosis not present

## 2021-03-21 DIAGNOSIS — R0902 Hypoxemia: Secondary | ICD-10-CM | POA: Diagnosis not present

## 2021-03-21 DIAGNOSIS — M62838 Other muscle spasm: Secondary | ICD-10-CM | POA: Diagnosis not present

## 2021-03-21 DIAGNOSIS — Z7984 Long term (current) use of oral hypoglycemic drugs: Secondary | ICD-10-CM | POA: Diagnosis not present

## 2021-03-21 DIAGNOSIS — Z7982 Long term (current) use of aspirin: Secondary | ICD-10-CM | POA: Diagnosis not present

## 2021-03-21 LAB — CBC
HCT: 45 % (ref 36.0–46.0)
Hemoglobin: 14.5 g/dL (ref 12.0–15.0)
MCH: 28.2 pg (ref 26.0–34.0)
MCHC: 32.2 g/dL (ref 30.0–36.0)
MCV: 87.4 fL (ref 80.0–100.0)
Platelets: 227 K/uL (ref 150–400)
RBC: 5.15 MIL/uL — ABNORMAL HIGH (ref 3.87–5.11)
RDW: 14.1 % (ref 11.5–15.5)
WBC: 7.6 K/uL (ref 4.0–10.5)
nRBC: 0 % (ref 0.0–0.2)

## 2021-03-21 LAB — BASIC METABOLIC PANEL WITH GFR
Anion gap: 10 (ref 5–15)
BUN: 15 mg/dL (ref 8–23)
CO2: 26 mmol/L (ref 22–32)
Calcium: 9.6 mg/dL (ref 8.9–10.3)
Chloride: 98 mmol/L (ref 98–111)
Creatinine, Ser: 0.75 mg/dL (ref 0.44–1.00)
GFR, Estimated: >60 mL/min (ref >60)
Glucose, Bld: 163 mg/dL — ABNORMAL HIGH (ref 70–99)
Potassium: 4.2 mmol/L (ref 3.5–5.1)
Sodium: 134 mmol/L — ABNORMAL LOW (ref 135–145)

## 2021-03-21 LAB — TROPONIN I (HIGH SENSITIVITY): Troponin I (High Sensitivity): 6 ng/L (ref <18)

## 2021-03-21 LAB — POCT GLYCOSYLATED HEMOGLOBIN (HGB A1C): Hemoglobin A1C: 6.2 % — AB (ref 4.0–5.6)

## 2021-03-21 MED ORDER — AMLODIPINE BESYLATE 5 MG PO TABS
5.0000 mg | ORAL_TABLET | Freq: Every day | ORAL | 1 refills | Status: DC
Start: 2021-03-21 — End: 2021-09-19

## 2021-03-21 MED ORDER — ACETAMINOPHEN 500 MG PO TABS
1000.0000 mg | ORAL_TABLET | Freq: Once | ORAL | Status: AC
  Administered 2021-03-21: 1000 mg via ORAL
  Filled 2021-03-21: qty 2

## 2021-03-21 MED ORDER — LIDOCAINE 5 % EX PTCH
1.0000 | MEDICATED_PATCH | CUTANEOUS | Status: DC
Start: 2021-03-21 — End: 2021-03-21
  Administered 2021-03-21: 1 via TRANSDERMAL
  Filled 2021-03-21: qty 1

## 2021-03-21 MED ORDER — METFORMIN HCL ER 500 MG PO TB24
1000.0000 mg | ORAL_TABLET | Freq: Two times a day (BID) | ORAL | 1 refills | Status: DC
Start: 2021-03-21 — End: 2021-09-19

## 2021-03-21 MED ORDER — CARVEDILOL 3.125 MG PO TABS: 3.1250 mg | ORAL_TABLET | Freq: Two times a day (BID) | ORAL | 1 refills | Status: DC

## 2021-03-21 MED ORDER — DIAZEPAM 2 MG PO TABS
1.0000 mg | ORAL_TABLET | Freq: Once | ORAL | Status: AC
  Administered 2021-03-21: 1 mg via ORAL
  Filled 2021-03-21: qty 1

## 2021-03-21 MED ORDER — DIAZEPAM 2 MG PO TABS: 1.0000 mg | ORAL_TABLET | Freq: Three times a day (TID) | ORAL | 0 refills | Status: DC | PRN

## 2021-03-21 MED ORDER — EZETIMIBE 10 MG PO TABS: 10.0000 mg | ORAL_TABLET | Freq: Every day | ORAL | 1 refills | Status: DC

## 2021-03-21 MED ORDER — LIDOCAINE 5 % EX PTCH: 1.0000 | MEDICATED_PATCH | CUTANEOUS | 0 refills | Status: DC

## 2021-03-21 NOTE — ED Notes (Signed)
Port CXR performed 

## 2021-03-21 NOTE — ED Provider Notes (Signed)
[facility] Emergency Department Provider Note   ____________________________________________   Event Date/Time   First MD Initiated Contact with Patient 03/21/21 0330     (approximate)  I have reviewed the triage vital signs and the nursing notes.   HISTORY  Chief Complaint Neck pain    HPI GLENNDA Warren is a 75 y.o. female brought to the ED via EMS from home with a chief complaint of nontraumatic left-sided neck pain.  Patient with a history of diabetes, hypertension, hyperlipidemia, cervical degenerative disc disease who reports left-sided neck pain on awakening.  She is holding her neck; movements exacerbate pain.  Endorses mild cough.  Denies fall/trauma/injury.  Denies fever, chills, chest pain, shortness of breath, abdominal pain, nausea, vomiting, headache or dizziness.     Past Medical History:  Diagnosis Date   Arthritis    Bronchitis    none currently   Chronic cough    Costochondritis    Diabetes mellitus without complication ([place])    8-9 years   Diverticulitis    Diverticulitis 08/2016   GERD (gastroesophageal reflux disease)    Hypercholesteremia    Hypertension    Hypomagnesemia 08/19/2014   Lactic acidosis 08/24/2016   Wears hearing aid    right ear    Patient Active Problem List   Diagnosis Date Noted   Benign essential hypertension 12/20/2020   Degenerative disc disease, cervical 03/23/2020   Chronic bilateral low back pain with bilateral sciatica 02/12/2020   Lumbar spondylosis with myelopathy 02/09/2020   Aortic atherosclerosis ([place]) 01/19/2020   Cardiomyopathy ([place]) 11/21/2018   GERD (gastroesophageal reflux disease) 11/21/2018   Hyperlipidemia associated with type 2 diabetes mellitus ([place]) 11/21/2018   Localized osteoarthritis of left knee 05/09/2018   B12 deficiency 04/30/2018   Obesity (BMI 30.0-34.9) 09/14/2017   Hepatic steatosis 02/26/2017   Diverticulitis of colon 09/22/2016   Vitamin D deficiency  09/01/2016   Type II diabetes mellitus with complication ([place]) 57/32/2025   Hx of adenomatous colonic polyps 02/24/2016   NAFLD (nonalcoholic fatty liver disease) 12/16/2015   Osteopenia of multiple sites 09/09/2015   Seasonal allergic rhinitis due to pollen 08/24/2015   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/2015   High risk medication use 02/23/2015   Abnormal mammogram with microcalcification 07/02/2014   Muscle cramp, nocturnal 08/20/2013   Hearing loss of both ears 08/19/2013    Past Surgical History:  Procedure Laterality Date   CATARACT EXTRACTION W/PHACO Right 12/07/2014   Procedure: CATARACT EXTRACTION PHACO AND INTRAOCULAR LENS PLACEMENT ([place]);  Surgeon: Ronnell Freshwater, MD;  Location: [place];  Service: Ophthalmology;  Laterality: Right;  DIABETIC - oral meds   CHOLECYSTECTOMY  2007   COLONOSCOPY N/A 02/08/2016   Procedure: COLONOSCOPY;  Surgeon: Lollie Sails, MD;  Location: [facility] ENDOSCOPY;  Service: Endoscopy;  Laterality: N/A;  Diabetes   MASTOIDECTOMY     THUMB AMPUTATION     removal of 2nd thumb   TONSILLECTOMY      Prior to Admission medications   Medication Sig Start Date End Date Taking? Authorizing Provider  diazepam (VALIUM) 2 MG tablet Take 0.5 tablets (1 mg total) by mouth every 8 (eight) hours as needed for muscle spasms. 03/21/21  Yes Paulette Blanch, MD  lidocaine (LIDODERM) 5 % Place 1 patch onto the skin daily. Remove & Discard patch within 12 hours or as directed by MD 03/21/21  Yes Paulette Blanch, MD  acetaminophen (TYLENOL) 500 MG tablet Take 500 mg by mouth every 6 (six)  hours as needed for moderate pain, fever or headache.     [provider]  amLODipine (NORVASC) 10 MG tablet Take 10 mg by mouth daily. 12/10/20   [provider]  aspirin 81 MG tablet Take 81 mg by mouth daily. AM    [provider]  carvedilol (COREG) 3.125 MG tablet Take 3.125 mg by mouth 2 (two) times daily with a meal.    [provider]  docusate sodium (COLACE) 100 MG capsule Take 1 capsule (100 mg total) by mouth 2 (two) times daily. Patient not taking: Reported on 02/21/2021 08/24/16   Theodoro Grist, MD  ezetimibe (ZETIA) 10 MG tablet Take by mouth. 03/23/20 03/23/21  [provider]  glucose blood (ONETOUCH ULTRA) test strip USE AS DIRECTED DAILY 12/12/17   [provider]  metFORMIN (GLUCOPHAGE-XR) 500 MG 24 hr tablet Take 2,000 mg by mouth daily.    [provider]  omeprazole (PRILOSEC) 40 MG capsule Take 40 mg by mouth daily.    [provider]  ondansetron (ZOFRAN) 4 MG tablet Take 1 tablet (4 mg total) by mouth every 8 (eight) hours as needed for nausea or vomiting. 02/04/21   Glean Hess, MD  OneTouch Delica Lancets 96V MISC See admin instructions. 11/21/18   [provider]    Allergies Duloxetine, Tramadol, and Motrin [ibuprofen]  Family History  Problem Relation Age of Onset   Cancer Mother 65       Cervical   Heart attack Father    Diabetes Neg Hx    COPD Neg Hx     Social History Social History   Tobacco Use   Smoking status: Never   Smokeless tobacco: Never  Vaping Use   Vaping Use: Never used  Substance Use Topics   Alcohol use: No   Drug use: No    Review of Systems  Constitutional: No fever/chills Eyes: No visual changes. ENT: No sore throat. Cardiovascular: Denies chest pain. Respiratory: Denies shortness of breath. Gastrointestinal: No abdominal pain.  No nausea, no vomiting.  No diarrhea.  No constipation. Genitourinary: Negative for dysuria. Musculoskeletal: Positive for left neck pain.  Negative for back pain. Skin: Negative for rash. Neurological: Negative for headaches, focal weakness or numbness.   ____________________________________________   PHYSICAL EXAM:  VITAL SIGNS: ED Triage Vitals  Enc Vitals Group     BP      Pulse      Resp      Temp      Temp src      SpO2      Weight      Height       Head Circumference      Peak Flow      Pain Score      Pain Loc      Pain Edu?      Excl. in [place]?     Constitutional: Alert and oriented.  Elderly appearing and in mild acute distress. Eyes: Conjunctivae are normal. PERRL. EOMI. Head: Atraumatic. Nose: No congestion/rhinnorhea. Mouth/Throat: Mucous membranes are moist.   Neck: No stridor.  No cervical spine tenderness to palpation.  Left trapezius muscle spasms.  Limited range of motion turning head especially towards left side secondary to pain.  No carotid bruits. Cardiovascular: Normal rate, regular rhythm. Grossly normal heart sounds.  Good peripheral circulation. Respiratory: Normal respiratory effort.  No retractions. Lungs CTAB. Gastrointestinal: Soft and nontender. No distention. No abdominal bruits. No CVA tenderness. Musculoskeletal: No lower extremity tenderness  nor edema.  No joint effusions. Neurologic:  Normal speech and language. No gross focal neurologic deficits are appreciated.  5/5 motor strength and sensation all extremities.  No gait instability. Skin:  Skin is warm, dry and intact. No rash noted. Psychiatric: Mood and affect are normal. Speech and behavior are normal.  ____________________________________________   LABS (all labs ordered are listed, but only abnormal results are displayed)  Labs Reviewed  CBC - Abnormal; Notable for the following components:      Result Value   RBC 5.15 (*)    All other components within normal limits  BASIC METABOLIC PANEL - Abnormal; Notable for the following components:   Sodium 134 (*)    Glucose, Bld 163 (*)    All other components within normal limits  TROPONIN I (HIGH SENSITIVITY)   ____________________________________________  EKG  ED ECG REPORT I, Lucresia Simic J, the attending physician, personally viewed and interpreted this ECG.   Date: 03/21/2021  EKG Time: 0414  Rate: 76  Rhythm: normal sinus rhythm  Axis: Normal  Intervals:none  ST&T Change:  Nonspecific  ____________________________________________  RADIOLOGY I, Daijah Scrivens J, personally viewed and evaluated these images (plain radiographs) as part of my medical decision making, as well as reviewing the written report by the radiologist.  ED MD interpretation:  Mild cardiomegaly with mild vascular congestion  Official radiology report(s): DG Chest Port 1 View  Result Date: 03/21/2021 CLINICAL DATA:  Cough. EXAM: PORTABLE CHEST 1 VIEW COMPARISON:  Chest radiograph dated 03/13/2020. FINDINGS: Shallow inspiration. There is mild cardiomegaly with mild vascular congestion. No focal consolidation, pleural effusion, pneumothorax. Atherosclerotic calcification of the aorta. No acute osseous pathology. IMPRESSION: Mild cardiomegaly with mild vascular congestion. No focal consolidation. Electronically Signed   By: Anner Crete M.D.   On: 03/21/2021 03:58    ____________________________________________   PROCEDURES  Procedure(s) performed (including Critical Care):  .1-3 Lead EKG Interpretation Performed by: Paulette Blanch, MD Authorized by: Paulette Blanch, MD     Interpretation: normal     ECG rate:  75   ECG rate assessment: normal     Rhythm: sinus rhythm     Ectopy: none     Conduction: normal   Comments:     Patient placed on cardiac monitor to evaluate for arrhythmias   ____________________________________________   INITIAL IMPRESSION / ASSESSMENT AND PLAN / ED COURSE  As part of my medical decision making, I reviewed the following data within the [facility] notes reviewed and incorporated, Labs reviewed, Old chart reviewed, and Notes from prior ED visits     75 year old female presenting with nontraumatic left neck pain.  Differential diagnosis includes but is not limited to muscle strain, cervical radiculopathy, ACS, infectious, metabolic etiologies, etc.  I have personally reviewed patient's chart and see that she had an ED visit  03/13/2020 for similar complaints.  She had CT imaging of her cervical spine at that time which demonstrated multilevel degenerative disc disease, foraminal stenosis and disc bulging as well as diffuse osteophytes.  At that time she was treated with Tylenol and lidocaine patch with relief of symptoms.  Will trial those first; consider muscle relaxer if needed.  Patient is neurologically intact without focal deficits; do not feel repeat imaging is warranted at this time.  Will obtain basic lab work, EKG and chest x-ray.  Will reassess.  Clinical Course as of 03/21/21 7782  [facility] Mar 21, 2021  0502 Pain improved, able to move neck with some pain. Will  add low-dose PO Valium for muscle relaxation. Updated patient on all test results. [JS]  0630 Feeling much improved.  Will discharge home with prescription for lidocaine patch and low-dose Valium to use as needed.  Encouraged patient to take Tylenol and apply moist heat to affected area.  She has a previously scheduled PCP appointment this afternoon.  Strict return precautions given.  Patient verbalizes understanding and agrees with plan of care. [JS]    Clinical Course User Index [JS] Paulette Blanch, MD     ____________________________________________   FINAL CLINICAL IMPRESSION(S) / ED DIAGNOSES  Final diagnoses:  Neck pain  Cervical radiculopathy     ED Discharge Orders          Ordered    lidocaine (LIDODERM) 5 %  Every 24 hours        03/21/21 0604    diazepam (VALIUM) 2 MG tablet  Every 8 hours PRN        03/21/21 0604             Note:  This document was prepared using Dragon voice recognition software and may include unintentional dictation errors.    Paulette Blanch, MD 03/21/21 [phone]

## 2021-03-21 NOTE — Discharge Instructions (Addendum)
You may take Tylenol as needed for pain. Apply Lidocaine patch to affected area as directed. You may take 1/2 Valium tablet three times daily as needed for muscle spasms. Return to the ER for worsening symptoms, persistent vomiting, difficulty breathing or other concerns.

## 2021-03-21 NOTE — ED Notes (Signed)
MD at the bedside  

## 2021-03-21 NOTE — ED Notes (Signed)
Dr. Sung at the bedside °

## 2021-03-21 NOTE — ED Notes (Signed)
Called patient's son, Sonia Side, for an update on pt's condition. Advised that when pt's disposition is decided, either myself or the dayshift RN will call him to let him know if she is to be discharged or admitted to the hospital. Pt's son verbalized understanding.     **  Cell number# [phone]  **

## 2021-03-21 NOTE — ED Triage Notes (Signed)
Patient arrived via EMS from home with c/c of headache with sharp, intermittent, shooting pains down to her left shoulder area. States this pain started yesterday. Denies any falls or injuries. According to patient's old charts, this has happened.

## 2021-03-21 NOTE — ED Notes (Signed)
Pt assisted from toilet to stretcher with walker.

## 2021-03-21 NOTE — ED Notes (Signed)
Pt discharged with family. D/c papers reviewed. Verbalizes understanding of instructions, denies questions or concerns

## 2021-03-21 NOTE — Patient Instructions (Addendum)
Take 1/2 - 1 Diazepam 2 mg three times a day.  Call for more medication as needed  Use Heat on your neck instead of the patches.  I have placed a referral to [facility].

## 2021-03-21 NOTE — ED Notes (Signed)
This RN called family at number listed in chart to notify of discharge, daughter in law reports will come to get pt.

## 2021-03-21 NOTE — Progress Notes (Addendum)
Date:  03/21/2021   Name:  Alison Warren   DOB:  04-16-1945   MRN:  [id number]   Chief Complaint: Hypertension and Diabetes  Hypertension This is a chronic problem. The problem is controlled. Associated symptoms include neck pain. Pertinent negatives include no chest pain, headaches, palpitations or shortness of breath. Past treatments include calcium channel blockers and beta blockers (amlodipine decr due to edema). Compliance problems include medication side effects.  Hypertensive end-organ damage includes kidney disease and CAD/MI.  Diabetes She presents for her follow-up diabetic visit. She has type 2 diabetes mellitus. Her disease course has been stable. Pertinent negatives for hypoglycemia include no headaches or tremors. Pertinent negatives for diabetes include no chest pain, no fatigue, no polydipsia and no polyuria. Current diabetic treatment includes oral agent (monotherapy) (metformin).  Neck Pain  This is a new problem. The current episode started today. The problem occurs constantly. The problem has been unchanged. The pain is present in the left side and midline. The quality of the pain is described as aching, burning and cramping. The pain is moderate. Pertinent negatives include no chest pain, fever, headaches, numbness or trouble swallowing. Treatments tried: low dose valium and lidocaine patch.   Lab Results  Component Value Date   NA 134 (L) 03/21/2021   K 4.2 03/21/2021   CO2 26 03/21/2021   GLUCOSE 163 (H) 03/21/2021   BUN 15 03/21/2021   CREATININE 0.75 03/21/2021   CALCIUM 9.6 03/21/2021   EGFR 58 (L) 12/20/2020   GFRNONAA >60 03/21/2021   Lab Results  Component Value Date   CHOL 207 (H) 12/20/2020   HDL 50 12/20/2020   LDLCALC 97 12/20/2020   TRIG 362 (H) 12/20/2020   CHOLHDL 4.1 12/20/2020   No results found for: TSH Lab Results  Component Value Date   HGBA1C 7.4 (H) 12/20/2020   Lab Results  Component Value Date   WBC 7.6 03/21/2021   HGB 14.5  03/21/2021   HCT 45.0 03/21/2021   MCV 87.4 03/21/2021   PLT 227 03/21/2021   Lab Results  Component Value Date   ALT 36 (H) 12/20/2020   AST 41 (H) 12/20/2020   ALKPHOS 117 12/20/2020   BILITOT 0.3 12/20/2020   No results found for: 25OHVITD2, 25OHVITD3, VD25OH   Review of Systems  Constitutional:  Negative for appetite change, fatigue, fever and unexpected weight change.  HENT:  Negative for tinnitus and trouble swallowing.   Eyes:  Negative for visual disturbance.  Respiratory:  Negative for cough, chest tightness and shortness of breath.   Cardiovascular:  Negative for chest pain, palpitations and leg swelling.  Gastrointestinal:  Negative for abdominal pain.  Endocrine: Negative for polydipsia and polyuria.  Genitourinary:  Negative for dysuria and hematuria.  Musculoskeletal:  Positive for myalgias and neck pain. Negative for arthralgias.  Skin:        Long thick toenails  Neurological:  Negative for tremors, numbness and headaches.  Psychiatric/Behavioral:  Negative for dysphoric mood.    Patient Active Problem List   Diagnosis Date Noted   Benign essential hypertension 12/20/2020   Degenerative disc disease, cervical 03/23/2020   Chronic bilateral low back pain with bilateral sciatica 02/12/2020   Lumbar spondylosis with myelopathy 02/09/2020   Aortic atherosclerosis ([place]) 01/19/2020   Cardiomyopathy ([place]) 11/21/2018   GERD (gastroesophageal reflux disease) 11/21/2018   Hyperlipidemia associated with type 2 diabetes mellitus ([place]) 11/21/2018   Localized osteoarthritis of left knee 05/09/2018   B12 deficiency 04/30/2018   Obesity (  BMI 30.0-34.9) 09/14/2017   Hepatic steatosis 02/26/2017   Diverticulitis of colon 09/22/2016   Vitamin D deficiency 09/01/2016   Type II diabetes mellitus with complication ([place]) 34/19/6222   Hx of adenomatous colonic polyps 02/24/2016   NAFLD (nonalcoholic fatty liver disease) 12/16/2015   Osteopenia of multiple sites 09/09/2015    Seasonal allergic rhinitis due to pollen 08/24/2015   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/2015   High risk medication use 02/23/2015   Abnormal mammogram with microcalcification 07/02/2014   Muscle cramp, nocturnal 08/20/2013   Hearing loss of both ears 08/19/2013    Allergies  Allergen Reactions   Duloxetine Nausea And Vomiting   Tramadol Nausea And Vomiting   Motrin [Ibuprofen] Rash    Past Surgical History:  Procedure Laterality Date   CATARACT EXTRACTION W/PHACO Right 12/07/2014   Procedure: CATARACT EXTRACTION PHACO AND INTRAOCULAR LENS PLACEMENT ([place]);  Surgeon: Ronnell Freshwater, MD;  Location: [place];  Service: Ophthalmology;  Laterality: Right;  DIABETIC - oral meds   CHOLECYSTECTOMY  2007   COLONOSCOPY N/A 02/08/2016   Procedure: COLONOSCOPY;  Surgeon: Lollie Sails, MD;  Location: [facility] ENDOSCOPY;  Service: Endoscopy;  Laterality: N/A;  Diabetes   MASTOIDECTOMY     THUMB AMPUTATION     removal of 2nd thumb   TONSILLECTOMY      Social History   Tobacco Use   Smoking status: Never   Smokeless tobacco: Never  Vaping Use   Vaping Use: Never used  Substance Use Topics   Alcohol use: No   Drug use: No     Medication list has been reviewed and updated.  Current Meds  Medication Sig   acetaminophen (TYLENOL) 500 MG tablet Take 500 mg by mouth every 6 (six) hours as needed for moderate pain, fever or headache.    amLODipine (NORVASC) 10 MG tablet Take 10 mg by mouth daily.   aspirin 81 MG tablet Take 81 mg by mouth daily. AM   carvedilol (COREG) 3.125 MG tablet Take 3.125 mg by mouth 2 (two) times daily with a meal.   diazepam (VALIUM) 2 MG tablet Take 0.5 tablets (1 mg total) by mouth every 8 (eight) hours as needed for muscle spasms.   ezetimibe (ZETIA) 10 MG tablet Take by mouth.   glucose blood (ONETOUCH ULTRA) test strip USE AS DIRECTED DAILY   lidocaine (LIDODERM) 5 % Place 1 patch onto the skin daily. Remove & Discard patch within 12  hours or as directed by MD   metFORMIN (GLUCOPHAGE-XR) 500 MG 24 hr tablet Take 2,000 mg by mouth daily.   omeprazole (PRILOSEC) 40 MG capsule Take 40 mg by mouth daily.   ondansetron (ZOFRAN) 4 MG tablet Take 1 tablet (4 mg total) by mouth every 8 (eight) hours as needed for nausea or vomiting.   OneTouch Delica Lancets 97L MISC See admin instructions.    PHQ 2/9 Scores 03/21/2021 02/21/2021 02/04/2021 12/20/2020  PHQ - 2 Score 0 0 2 2  PHQ- 9 Score _0 GAD 7 : Generalized Anxiety Score 03/21/2021 02/04/2021 12/20/2020  Nervous, Anxious, on Edge _1 Control/stop worrying _2 Worry too much - different things _3 Trouble relaxing _4 Restless _5 Easily annoyed or irritable _6 Afraid - awful might happen 0 1 1  Total GAD 7 Score _7 Anxiety Difficulty Not difficult at all Not difficult at all -  BP Readings from Last 3 Encounters:  03/21/21 122/78  03/21/21 (!) 142/79  02/04/21 120/70    Physical Exam Vitals and nursing note reviewed.  Constitutional:      General: She is not in acute distress.    Appearance: Normal appearance. She is well-developed.  HENT:     Head: Normocephalic and atraumatic.  Cardiovascular:     Rate and Rhythm: Normal rate and regular rhythm.     Pulses: Normal pulses.     Heart sounds: No murmur heard. Pulmonary:     Effort: Pulmonary effort is normal. No respiratory distress.     Breath sounds: No wheezing or rhonchi.  Musculoskeletal:        General: Deformity present.     Cervical back: Rigidity and tenderness present.     Right lower leg: Edema present.     Left lower leg: Edema present.  Skin:    General: Skin is warm and dry.     Findings: No rash.  Neurological:     General: No focal deficit present.     Mental Status: She is alert and oriented to person, place, and time.  Psychiatric:        Mood and Affect: Mood normal.        Behavior: Behavior normal.    Wt Readings from Last 3 Encounters:   03/21/21 172 lb (78 kg)  03/21/21 170 lb 8 oz (77.3 kg)  02/04/21 162 lb (73.5 kg)    BP 122/78   Pulse 69   Ht _0  (1.626 m)   Wt 172 lb (78 kg)   SpO2 96%   BMI 29.52 kg/m   Assessment and Plan: 1. Benign essential hypertension Clinically stable exam with well controlled BP. Tolerating medications without side effects at this time.  Edema improved with lower dose amlodipine. Pt to continue current regimen and low sodium diet; benefits of regular exercise as able discussed. - amLODipine (NORVASC) 5 MG tablet; Take 1 tablet (5 mg total) by mouth daily.  Dispense: 90 tablet; Refill: 1 - carvedilol (COREG) 3.125 MG tablet; Take 1 tablet (3.125 mg total) by mouth 2 (two) times daily with a meal.  Dispense: 180 tablet; Refill: 1  2. Type II diabetes mellitus with complication (HCC) Clinically stable by exam and report without s/s of hypoglycemia. DM complicated by hypertension and dyslipidemia. Tolerating medications well without side effects or other concerns. Refer for foot and nail care - Microalbumin / creatinine urine ratio - POCT glycosylated hemoglobin (Hb A1C) = 6.2 - metFORMIN (GLUCOPHAGE-XR) 500 MG 24 hr tablet; Take 2 tablets (1,000 mg total) by mouth in the morning and at bedtime.  Dispense: 360 tablet; Refill: 1 - Ambulatory referral to Podiatry  3. Hyperlipidemia associated with type 2 diabetes mellitus (HCC) - ezetimibe (ZETIA) 10 MG tablet; Take 1 tablet (10 mg total) by mouth daily.  Dispense: 90 tablet; Refill: 1  4. Cardiomyopathy, unspecified type ([place]) Unable to verify this Dx ECHO from 2016 showed no cardiomyopathy or HF  5. Neck muscle spasm D/C lidoderm patches due to cost Continue Valium 1-2 mg every 8 hours Use heat to neck and shoulder    Partially dictated using Editor, commissioning. Any errors are unintentional.  Halina Maidens, MD [facility]  03/21/2021

## 2021-03-22 LAB — MICROALBUMIN / CREATININE URINE RATIO
Creatinine, Urine: 75.9 mg/dL
Microalb/Creat Ratio: 32 mg/g creat — ABNORMAL HIGH (ref 0–29)
Microalbumin, Urine: 24.4 ug/mL

## 2021-03-29 ENCOUNTER — Ambulatory Visit: Payer: Medicare Other | Admitting: Podiatry

## 2021-03-29 ENCOUNTER — Other Ambulatory Visit: Payer: Self-pay

## 2021-03-29 DIAGNOSIS — M79675 Pain in left toe(s): Secondary | ICD-10-CM | POA: Diagnosis not present

## 2021-03-29 DIAGNOSIS — E0843 Diabetes mellitus due to underlying condition with diabetic autonomic (poly)neuropathy: Secondary | ICD-10-CM | POA: Diagnosis not present

## 2021-03-29 DIAGNOSIS — E119 Type 2 diabetes mellitus without complications: Secondary | ICD-10-CM

## 2021-03-29 DIAGNOSIS — M79674 Pain in right toe(s): Secondary | ICD-10-CM

## 2021-03-29 DIAGNOSIS — B351 Tinea unguium: Secondary | ICD-10-CM | POA: Diagnosis not present

## 2021-03-29 NOTE — Progress Notes (Signed)
° °  SUBJECTIVE Patient with a history of diabetes mellitus presents to office today complaining of elongated, thickened nails that cause pain while ambulating in shoes.  Patient is unable to trim their own nails. Patient is here for further evaluation and treatment.   Past Medical History:  Diagnosis Date   Arthritis    Bronchitis    none currently   Chronic cough    Costochondritis    Diabetes mellitus without complication ([place])    8-9 years   Diverticulitis    Diverticulitis 08/2016   GERD (gastroesophageal reflux disease)    Hypercholesteremia    Hypertension    Hypomagnesemia 08/19/2014   Lactic acidosis 08/24/2016   Wears hearing aid    right ear    OBJECTIVE General Patient is awake, alert, and oriented x 3 and in no acute distress. Derm Skin is dry and supple bilateral. Negative open lesions or macerations. Remaining integument unremarkable. Nails are tender, long, thickened and dystrophic with subungual debris, consistent with onychomycosis, 1-5 bilateral. No signs of infection noted. Vasc  DP and PT pedal pulses palpable bilaterally. Temperature gradient within normal limits.  Neuro Epicritic and protective threshold sensation diminished bilaterally.  Musculoskeletal Exam No symptomatic pedal deformities noted bilateral. Muscular strength within normal limits.  ASSESSMENT 1. Diabetes Mellitus w/ peripheral neuropathy 2.  Pain due to onychomycosis of toenails bilateral  PLAN OF CARE 1. Patient evaluated today.  Comprehensive diabetic foot exam performed today 2. Instructed to maintain good pedal hygiene and foot care. Stressed importance of controlling blood sugar.  3. Mechanical debridement of nails 1-5 bilaterally performed using a nail nipper. Filed with dremel without incident.  4. Return to clinic in 3 mos.     Edrick Kins, DPM [facility]  Dr. Edrick Kins, DPM    [address], [place]  [address]                Office [phone]  Fax [phone]

## 2021-05-16 ENCOUNTER — Telehealth: Payer: Self-pay | Admitting: Internal Medicine

## 2021-05-16 NOTE — Telephone Encounter (Signed)
°   °  Medication Refill - Medication:  omeprazole (PRILOSEC) 40 MG capsule       Sig - Route: Take 40 mg by mouth daily. - Oral   Pt just became a New pt of Dr B in Sept and this is listed as one of her meds but had quite a bit left, needs a refill   Has the patient contacted their pharmacy? Yes.   (Agent: If no, request that the patient contact the pharmacy for the refill. If patient does not wish to contact the pharmacy document the reason why and proceed with request.) (Agent: If yes, when and what did the pharmacy advise?) call dr   Preferred Pharmacy (with phone number or street name):     [id number] - [place], [place]  [place] [address]  Phone: [phone] Fax: [phone]  Hours: Not open 24 hours   Has the patient been seen for an appointment in the last year OR does the patient have an upcoming appointment? Yes.    Agent: Please be advised that RX refills may take up to 3 business days. We ask that you follow-up with your pharmacy.

## 2021-05-17 NOTE — Telephone Encounter (Signed)
Requested medication (s) are due for refill today:   Requested medication (s) are on the active medication list: Yes  Last refill:    Future visit scheduled: Yes  Notes to clinic:  Historical provider.    Requested Prescriptions  Pending Prescriptions Disp Refills   omeprazole (PRILOSEC) 40 MG capsule      Sig: Take 1 capsule (40 mg total) by mouth daily.     Gastroenterology: Proton Pump Inhibitors Passed - 05/16/2021  7:11 PM      Passed - Valid encounter within last 12 months    Recent Outpatient Visits           1 month ago Benign essential hypertension   [place], [place]   3 months ago Continuous RLQ abdominal pain   [facility] Glean Hess, MD   4 months ago Type II diabetes mellitus with complication [facility])   [facility] Glean Hess, MD       Future Appointments             In 2 months Army Melia Jesse Sans, MD [facility], [facility]

## 2021-05-17 NOTE — Telephone Encounter (Signed)
Refill if appropriate.  Please advise. History of seeing GI 2021.

## 2021-05-18 ENCOUNTER — Other Ambulatory Visit: Payer: Self-pay

## 2021-05-18 MED ORDER — OMEPRAZOLE 40 MG PO CPDR: 40.0000 mg | DELAYED_RELEASE_CAPSULE | Freq: Every day | ORAL | 0 refills | Status: DC

## 2021-05-18 NOTE — Telephone Encounter (Signed)
[facility]

## 2021-05-18 NOTE — Telephone Encounter (Signed)
Pt stated doesn't go to [facility] clinic anymore and she was let go in December /doesn't have a GI provider / Pt stated she forgot to mention this to DR. Army Melia / pt stated she will be completley out after tomorrow / please advise

## 2021-06-28 ENCOUNTER — Other Ambulatory Visit: Payer: Self-pay

## 2021-06-28 ENCOUNTER — Ambulatory Visit: Payer: Medicare Other | Admitting: Podiatry

## 2021-06-28 DIAGNOSIS — E0843 Diabetes mellitus due to underlying condition with diabetic autonomic (poly)neuropathy: Secondary | ICD-10-CM | POA: Diagnosis not present

## 2021-06-28 DIAGNOSIS — M79675 Pain in left toe(s): Secondary | ICD-10-CM | POA: Diagnosis not present

## 2021-06-28 DIAGNOSIS — B351 Tinea unguium: Secondary | ICD-10-CM | POA: Diagnosis not present

## 2021-06-28 DIAGNOSIS — M79674 Pain in right toe(s): Secondary | ICD-10-CM | POA: Diagnosis not present

## 2021-06-28 NOTE — Progress Notes (Signed)
? ?  SUBJECTIVE ?Patient with a history of diabetes mellitus presents to office today complaining of elongated, thickened nails that cause pain while ambulating in shoes.  Patient is unable to trim their own nails. Patient is here for further evaluation and treatment. ? ? ?Past Medical History:  ?Diagnosis Date  ? Arthritis   ? Bronchitis   ? none currently  ? Chronic cough   ? Costochondritis   ? Diabetes mellitus without complication ([place])   ? 8-9 years  ? Diverticulitis   ? Diverticulitis 08/2016  ? GERD (gastroesophageal reflux disease)   ? Hypercholesteremia   ? Hypertension   ? Hypomagnesemia 08/19/2014  ? Lactic acidosis 08/24/2016  ? Wears hearing aid   ? right ear  ? ? ?OBJECTIVE ?General Patient is awake, alert, and oriented x 3 and in no acute distress. ?Derm Skin is dry and supple bilateral. Negative open lesions or macerations. Remaining integument unremarkable. Nails are tender, long, thickened and dystrophic with subungual debris, consistent with onychomycosis, 1-5 bilateral. No signs of infection noted. ?Vasc  DP and PT pedal pulses palpable bilaterally. Temperature gradient within normal limits.  ?Neuro Epicritic and protective threshold sensation diminished bilaterally.  ?Musculoskeletal Exam No symptomatic pedal deformities noted bilateral. Muscular strength within normal limits. ? ?ASSESSMENT ?1. Diabetes Mellitus w/ peripheral neuropathy ?2.  Pain due to onychomycosis of toenails bilateral ? ?PLAN OF CARE ?1. Patient evaluated today.   ?2. Instructed to maintain good pedal hygiene and foot care. Stressed importance of controlling blood sugar.  ?3. Mechanical debridement of nails 1-5 bilaterally performed using a nail nipper. Filed with dremel without incident.  ?4. Return to clinic in 3 mos.  ? ? ? ?Edrick Kins, DPM ?[place] ? ?Dr. Edrick Kins, DPM  ?  ?[address]                                      ?[place], [place] [address]                ?Office [phone]  ?Fax  [phone] ? ? ? ? ? ?

## 2021-07-03 ENCOUNTER — Other Ambulatory Visit: Payer: Self-pay

## 2021-07-03 ENCOUNTER — Emergency Department
Admission: EM | Admit: 2021-07-03 | Discharge: 2021-07-03 | Disposition: A | Discharge status: Home / Self Care | Payer: Medicare Other | Attending: Emergency Medicine | Admitting: Emergency Medicine

## 2021-07-03 ENCOUNTER — Emergency Department: Payer: Medicare Other

## 2021-07-03 DIAGNOSIS — Z20822 Contact with and (suspected) exposure to covid-19: Secondary | ICD-10-CM | POA: Diagnosis not present

## 2021-07-03 DIAGNOSIS — R1111 Vomiting without nausea: Secondary | ICD-10-CM | POA: Diagnosis not present

## 2021-07-03 DIAGNOSIS — R079 Chest pain, unspecified: Secondary | ICD-10-CM | POA: Diagnosis not present

## 2021-07-03 DIAGNOSIS — R0602 Shortness of breath: Secondary | ICD-10-CM | POA: Insufficient documentation

## 2021-07-03 DIAGNOSIS — R059 Cough, unspecified: Secondary | ICD-10-CM | POA: Diagnosis present

## 2021-07-03 DIAGNOSIS — I10 Essential (primary) hypertension: Secondary | ICD-10-CM | POA: Diagnosis not present

## 2021-07-03 DIAGNOSIS — E119 Type 2 diabetes mellitus without complications: Secondary | ICD-10-CM | POA: Diagnosis not present

## 2021-07-03 DIAGNOSIS — B9789 Other viral agents as the cause of diseases classified elsewhere: Secondary | ICD-10-CM | POA: Insufficient documentation

## 2021-07-03 DIAGNOSIS — I517 Cardiomegaly: Secondary | ICD-10-CM | POA: Diagnosis not present

## 2021-07-03 DIAGNOSIS — J069 Acute upper respiratory infection, unspecified: Secondary | ICD-10-CM | POA: Insufficient documentation

## 2021-07-03 LAB — CBC
HCT: 44.6 % (ref 36.0–46.0)
Hemoglobin: 14.3 g/dL (ref 12.0–15.0)
MCH: 27.7 pg (ref 26.0–34.0)
MCHC: 32.1 g/dL (ref 30.0–36.0)
MCV: 86.3 fL (ref 80.0–100.0)
Platelets: 224 10*3/uL (ref 150–400)
RBC: 5.17 MIL/uL — ABNORMAL HIGH (ref 3.87–5.11)
RDW: 14.4 % (ref 11.5–15.5)
WBC: 10.1 10*3/uL (ref 4.0–10.5)
nRBC: 0 % (ref 0.0–0.2)

## 2021-07-03 LAB — BASIC METABOLIC PANEL
Anion gap: 9 (ref 5–15)
BUN: 12 mg/dL (ref 8–23)
CO2: 24 mmol/L (ref 22–32)
Calcium: 9.5 mg/dL (ref 8.9–10.3)
Chloride: 103 mmol/L (ref 98–111)
Creatinine, Ser: 0.82 mg/dL (ref 0.44–1.00)
GFR, Estimated: >60 mL/min (ref >60)
Glucose, Bld: 142 mg/dL — ABNORMAL HIGH (ref 70–99)
Potassium: 4.1 mmol/L (ref 3.5–5.1)
Sodium: 136 mmol/L (ref 135–145)

## 2021-07-03 LAB — RESP PANEL BY RT-PCR (FLU A&B, COVID) ARPGX2
Influenza A by PCR: NEGATIVE
Influenza B by PCR: NEGATIVE
SARS Coronavirus 2 by RT PCR: NEGATIVE

## 2021-07-03 LAB — GROUP A STREP BY PCR: Group A Strep by PCR: NOT DETECTED

## 2021-07-03 LAB — BRAIN NATRIURETIC PEPTIDE: B Natriuretic Peptide: 25.6 pg/mL (ref 0.0–100.0)

## 2021-07-03 LAB — TROPONIN I (HIGH SENSITIVITY)
Troponin I (High Sensitivity): 6 ng/L (ref <18)
Troponin I (High Sensitivity): 6 ng/L (ref <18)

## 2021-07-03 MED ORDER — AMOXICILLIN-POT CLAVULANATE 875-125 MG PO TABS: 1.0000 | ORAL_TABLET | Freq: Two times a day (BID) | ORAL | 0 refills | Status: AC

## 2021-07-03 MED ORDER — ALBUTEROL SULFATE HFA 108 (90 BASE) MCG/ACT IN AERS: 2.0000 | INHALATION_SPRAY | Freq: Four times a day (QID) | RESPIRATORY_TRACT | 2 refills | Status: DC | PRN

## 2021-07-03 MED ORDER — ACETAMINOPHEN 500 MG PO TABS
1000.0000 mg | ORAL_TABLET | Freq: Once | ORAL | Status: AC
  Administered 2021-07-03: 1000 mg via ORAL
  Filled 2021-07-03: qty 2

## 2021-07-03 MED ORDER — AMOXICILLIN-POT CLAVULANATE 875-125 MG PO TABS
1.0000 | ORAL_TABLET | Freq: Once | ORAL | Status: AC
Start: 2021-07-03 — End: 2021-07-03
  Administered 2021-07-03: 1 via ORAL
  Filled 2021-07-03: qty 1

## 2021-07-03 MED ORDER — IPRATROPIUM-ALBUTEROL 0.5-2.5 (3) MG/3ML IN SOLN
3.0000 mL | Freq: Once | RESPIRATORY_TRACT | Status: AC
  Administered 2021-07-03: 3 mL via RESPIRATORY_TRACT
  Filled 2021-07-03: qty 3

## 2021-07-03 NOTE — ED Triage Notes (Addendum)
Pt with emesis, chest pain, shob since this am. Per ems pt with normal ekg. Pt states she has been coughing. Pt is very HOH.  ?

## 2021-07-03 NOTE — ED Notes (Signed)
O2 sat 95-98% while ambulating. NAD noted ?

## 2021-07-03 NOTE — ED Provider Notes (Signed)
? ?[facility] ?Provider Note ? ? ? Event Date/Time  ? First MD Initiated Contact with Patient 07/03/21 0158   ?  (approximate) ? ? ?History  ? ?Chest Pain and Shortness of Breath ? ? ?HPI ? ?DANIELL Warren is a 76 y.o. female with a history of bronchitis, diabetes, hypertension, hyperlipidemia who presents for evaluation of cough, congestion, and sore throat.  Patient reports that her symptoms started yesterday.  She is complaining of a sharp burning sore throat.  She has been congested, cough productive of thick white phlegm.  She also has been having a little bit of shortness of breath with ambulation. Denies chest pain, has had body aches but she is not sure if she has had any fever.  She has had some nausea and 1 episode of vomiting.  No diarrhea.  No prior history of PE or DVT, no recent travel immobilization, no leg pain or swelling, no hemoptysis or exogenous hormones. ?  ? ? ?Past Medical History:  ?Diagnosis Date  ? Arthritis   ? Bronchitis   ? none currently  ? Chronic cough   ? Costochondritis   ? Diabetes mellitus without complication ([place])   ? 8-9 years  ? Diverticulitis   ? Diverticulitis 08/2016  ? GERD (gastroesophageal reflux disease)   ? Hypercholesteremia   ? Hypertension   ? Hypomagnesemia 08/19/2014  ? Lactic acidosis 08/24/2016  ? Wears hearing aid   ? right ear  ? ? ?Past Surgical History:  ?Procedure Laterality Date  ? CATARACT EXTRACTION W/PHACO Right 12/07/2014  ? Procedure: CATARACT EXTRACTION PHACO AND INTRAOCULAR LENS PLACEMENT (IOC);  Surgeon: Ronnell Freshwater, MD;  Location: [place];  Service: Ophthalmology;  Laterality: Right;  DIABETIC - oral meds  ? CHOLECYSTECTOMY  2007  ? COLONOSCOPY N/A 02/08/2016  ? Procedure: COLONOSCOPY;  Surgeon: Lollie Sails, MD;  Location: [facility] ENDOSCOPY;  Service: Endoscopy;  Laterality: N/A;  Diabetes  ? MASTOIDECTOMY    ? THUMB AMPUTATION    ? removal of 2nd thumb  ? TONSILLECTOMY    ? ? ? ?Physical Exam   ? ?Triage Vital Signs: ?ED Triage Vitals  ?Enc Vitals Group  ?   BP 07/03/21 0155 (!) 147/86  ?   Pulse Rate 07/03/21 0153 83  ?   Resp 07/03/21 0153 (!) 22  ?   Temp 07/03/21 0153 98.6 ?F (37 ?C)  ?   Temp Source 07/03/21 0153 Oral  ?   SpO2 07/03/21 0153 96 %  ?   Weight 07/03/21 0153 160 lb (72.6 kg)  ?   Height --   ?   Head Circumference --   ?   Peak Flow --   ?   Pain Score 07/03/21 0153 9  ?   Pain Loc --   ?   Pain Edu? --   ?   Excl. in [place]? --   ? ? ?Most recent vital signs: ?Vitals:  ? 07/03/21 0639 07/03/21 0641  ?BP:    ?Pulse: 73 74  ?Resp: 17 15  ?Temp:    ?SpO2: 95% 94%  ? ? ? ?Constitutional: Alert and oriented.  Actively coughing and spitting up thick white phlegm but no respiratory distress ?HEENT: ?     Head: Normocephalic and atraumatic.    ?     Eyes: Conjunctivae are normal. Sclera is non-icteric.  ?     Mouth/Throat: Mucous membranes are moist.  ?     Neck: Supple with no signs  of meningismus. ?Cardiovascular: Regular rate and rhythm. No murmurs, gallops, or rubs. 2+ symmetrical distal pulses are present in all extremities.  ?Respiratory: Normal respiratory effort. Lungs are clear to auscultation bilaterally.  ?Gastrointestinal: Soft, non tender, and non distended with positive bowel sounds. No rebound or guarding. ?Genitourinary: No CVA tenderness. ?Musculoskeletal:  No edema, cyanosis, or erythema of extremities. ?Neurologic: Normal speech and language. Face is symmetric. Moving all extremities. No gross focal neurologic deficits are appreciated. ?Skin: Skin is warm, dry and intact. No rash noted. ?Psychiatric: Mood and affect are normal. Speech and behavior are normal. ? ?ED Results / Procedures / Treatments  ? ?Labs ?(all labs ordered are listed, but only abnormal results are displayed) ?Labs Reviewed  ?BASIC METABOLIC PANEL - Abnormal; Notable for the following components:  ?    Result Value  ? Glucose, Bld 142 (*)   ? All other components within normal limits  ?CBC - Abnormal; Notable  for the following components:  ? RBC 5.17 (*)   ? All other components within normal limits  ?RESP PANEL BY RT-PCR (FLU A&B, COVID) ARPGX2  ?GROUP A STREP BY PCR  ?BRAIN NATRIURETIC PEPTIDE  ?TROPONIN I (HIGH SENSITIVITY)  ?TROPONIN I (HIGH SENSITIVITY)  ? ? ? ?EKG ? ?ED ECG REPORT ?I, Rudene Re, the attending physician, personally viewed and interpreted this ECG. ? ?Sinus rhythm with a rate of 85, low voltage QRS, no ST elevations or depressions.  Unchanged when compared to prior from December 2022 ? ?RADIOLOGY ?I, Rudene Re, attending MD, have personally viewed and interpreted the images obtained during this visit as below: ? ?Chest x-ray with no infiltrate ? ? ?___________________________________________________ ?Interpretation by Radiologist:  ?DG Chest 1 View ? ?Result Date: 07/03/2021 ?CLINICAL DATA:  Chest pain. EXAM: CHEST  1 VIEW COMPARISON:  Chest radiograph dated 03/21/2021 and CT dated 06/29/2011. FINDINGS: Mild cardiomegaly and vascular congestion. No focal consolidation, pleural effusion, pneumothorax. Atherosclerotic calcification of the aortic arch. No acute osseous pathology. IMPRESSION: Mild cardiomegaly and vascular congestion. No focal consolidation. Electronically Signed   By: Anner Crete M.D.   On: 07/03/2021 02:15   ? ? ? ?PROCEDURES: ? ?Critical Care performed: No ? ?Procedures ? ? ? ?IMPRESSION / MDM / ASSESSMENT AND PLAN / ED COURSE  ?I reviewed the triage vital signs and the nursing notes. ? ?76 y.o. female with a history of bronchitis, diabetes, hypertension, hyperlipidemia who presents for evaluation of cough, congestion, body aches, sore throat, SOB, nausea x 1 day.  Is actively coughing and producing some thick white phlegm.  She is in no respiratory distress, she has no wheezing or crackles, she looks euvolemic with no asymmetric leg swelling or pitting edema.  She has normal sats, she has no fever, no tachycardia, no tachypnea or hypoxia. ? ?Ddx: Viral syndrome  such as COVID or flu versus pneumonia versus myocarditis versus pericarditis versus bronchitis ? ? ?Plan: EKG, troponin x2, COVID and flu swab, chest x-ray, strep swab, metabolic panel, CBC, BNP.  Will give DuoNeb for shortness of breath and Tylenol for body aches and sore throat.  Patient placed on telemetry for monitoring of cardiorespiratory status ? ? ?MEDICATIONS GIVEN IN ED: ?Medications  ?ipratropium-albuterol (DUONEB) 0.5-2.5 (3) MG/3ML nebulizer solution 3 mL (3 mLs Nebulization Given 07/03/21 0509)  ?amoxicillin-clavulanate (AUGMENTIN) 875-125 MG per tablet 1 tablet (1 tablet Oral Given 07/03/21 0553)  ?acetaminophen (TYLENOL) tablet 1,000 mg (1,000 mg Oral Given 07/03/21 0554)  ? ? ? ?ED COURSE: Chest x-ray with no signs of pneumonia.  COVID and flu negative.  Normal white count, no fever therefore no signs of sepsis.  Strep negative.  No significant electrolyte derangements or any signs of dehydration.  EKG and 2 troponins with no signs of myocarditis or demand ischemia.  BNP is normal.  Patient ambulated and maintain her sats above 94% with normal work of breathing.  Will discharge home with an inhaler and Augmentin for possible early pneumonia/bronchitis.  Recommended close follow-up with primary care doctor.  Admission was considered but with no signs of sepsis, no oxygen requirement, and normal labs I feel the patient is safe for discharge home.  Discussed my standard return precautions. ? ? ?Consults: None ? ? ?EMR reviewed including patient's last visit with her primary care doctor from December 2022 for hypertension, diabetes, and hyperlipidemia. ? ? ? ?FINAL CLINICAL IMPRESSION(S) / ED DIAGNOSES  ? ?Final diagnoses:  ?Viral URI with cough  ? ? ? ?Rx / DC Orders  ? ?ED Discharge Orders   ? ?      Ordered  ?  albuterol (VENTOLIN HFA) 108 (90 Base) MCG/ACT inhaler  Every 6 hours PRN       ? 07/03/21 0616  ?  amoxicillin-clavulanate (AUGMENTIN) 875-125 MG tablet  2 times daily       ? 07/03/21 0616  ? ?   ?  ? ?  ? ? ? ?Note:  This document was prepared using Dragon voice recognition software and may include unintentional dictation errors. ? ? ?Please note:  Patient was evaluated in Emergency Department t

## 2021-07-03 NOTE — ED Notes (Signed)
Pt verbalized understanding of discharge.

## 2021-07-05 ENCOUNTER — Ambulatory Visit (INDEPENDENT_AMBULATORY_CARE_PROVIDER_SITE_OTHER): Payer: Medicare Other | Admitting: Internal Medicine

## 2021-07-05 ENCOUNTER — Encounter: Payer: Self-pay | Admitting: Internal Medicine

## 2021-07-05 ENCOUNTER — Other Ambulatory Visit: Payer: Self-pay

## 2021-07-05 VITALS — BP 128/72 | HR 84 | Ht 64.0 in | Wt 160.8 lb

## 2021-07-05 DIAGNOSIS — I10 Essential (primary) hypertension: Secondary | ICD-10-CM | POA: Diagnosis not present

## 2021-07-05 DIAGNOSIS — K7460 Unspecified cirrhosis of liver: Secondary | ICD-10-CM | POA: Diagnosis not present

## 2021-07-05 DIAGNOSIS — I700 Atherosclerosis of aorta: Secondary | ICD-10-CM | POA: Diagnosis not present

## 2021-07-05 DIAGNOSIS — J069 Acute upper respiratory infection, unspecified: Secondary | ICD-10-CM | POA: Diagnosis not present

## 2021-07-05 NOTE — Progress Notes (Signed)
? ? ?Date:  07/05/2021  ? ?Name:  Alison Warren   DOB:  [birth year]   MRN:  [id number] ? ? ?Chief Complaint: Hospitalization Follow-up ?ED follow up.  [facility] 07/03/21 for cough, congestion and sore throat.  Dx'd with viral syndrome and treated with Augmentin and Albuterol MDI. ?ED COURSE: Chest x-ray with no signs of pneumonia.  COVID and flu negative.  Normal white count, no fever therefore no signs of sepsis.  Strep negative.  No significant electrolyte derangements or any signs of dehydration.  EKG and 2 troponins with no signs of myocarditis or demand ischemia.  BNP is normal.  Patient ambulated and maintain her sats above 94% with normal work of breathing.  Will discharge home with an inhaler and Augmentin for possible early pneumonia/bronchitis.  Recommended close follow-up with primary care doctor.  Admission was considered but with no signs of sepsis, no oxygen requirement, and normal labs I feel the patient is safe for discharge home.  Discussed my standard return precautions. ?URI  ?This is a new problem. The current episode started in the past 7 days. Associated symptoms include congestion, coughing and a sore throat. Pertinent negatives include no chest pain, headaches or wheezing.  ?She is taking Augmentin without any side effects.  She is unsure how to use the MDI so that was demonstrated by the MA.  She is feeling better - less cough and less shortness of breath.  No wheezing or fever.  Drinking plenty of fluids.  Also taking Tylenol for sore throat. ?Lab Results  ?Component Value Date  ? NA 136 07/03/2021  ? K 4.1 07/03/2021  ? CO2 24 07/03/2021  ? GLUCOSE 142 (H) 07/03/2021  ? BUN 12 07/03/2021  ? CREATININE 0.82 07/03/2021  ? CALCIUM 9.5 07/03/2021  ? EGFR 58 (L) 12/20/2020  ? GFRNONAA >60 07/03/2021  ? ?Lab Results  ?Component Value Date  ? CHOL 207 (H) 12/20/2020  ? HDL 50 12/20/2020  ? [place] 97 12/20/2020  ? TRIG 362 (H) 12/20/2020  ? CHOLHDL 4.1 12/20/2020  ? ?No results found for: TSH ?Lab  Results  ?Component Value Date  ? HGBA1C 6.2 (A) 03/21/2021  ? ?Lab Results  ?Component Value Date  ? WBC 10.1 07/03/2021  ? HGB 14.3 07/03/2021  ? HCT 44.6 07/03/2021  ? MCV 86.3 07/03/2021  ? PLT 224 07/03/2021  ? ?Lab Results  ?Component Value Date  ? ALT 36 (H) 12/20/2020  ? AST 41 (H) 12/20/2020  ? ALKPHOS 117 12/20/2020  ? BILITOT 0.3 12/20/2020  ? ?No results found for: 25OHVITD2, [place], VD25OH  ? ?Review of Systems  ?Constitutional:  Negative for diaphoresis, fatigue and fever.  ?HENT:  Positive for congestion and sore throat.   ?Respiratory:  Positive for cough. Negative for shortness of breath and wheezing.   ?Cardiovascular:  Negative for chest pain, palpitations and leg swelling.  ?Allergic/Immunologic: Positive for environmental allergies.  ?Neurological:  Negative for dizziness, light-headedness and headaches.  ? ?Patient Active Problem List  ? Diagnosis Date Noted  ? Benign essential hypertension 12/20/2020  ? Degenerative disc disease, cervical 03/23/2020  ? Chronic bilateral low back pain with bilateral sciatica 02/12/2020  ? Lumbar spondylosis with myelopathy 02/09/2020  ? Aortic atherosclerosis ([place]) 01/19/2020  ? GERD (gastroesophageal reflux disease) 11/21/2018  ? Hyperlipidemia associated with type 2 diabetes mellitus ([place]) 11/21/2018  ? Localized osteoarthritis of left knee 05/09/2018  ? B12 deficiency 04/30/2018  ? Obesity (BMI 30.0-34.9) 09/14/2017  ? Hepatic steatosis 02/26/2017  ? Diverticulitis of  colon 09/22/2016  ? Vitamin D deficiency 09/01/2016  ? Type II diabetes mellitus with complication ([place]) 26/33/3545  ? Hx of adenomatous colonic polyps 02/24/2016  ? NAFLD (nonalcoholic fatty liver disease) 12/16/2015  ? Osteopenia of multiple sites 09/09/2015  ? Seasonal allergic rhinitis due to pollen 08/24/2015  ? 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 Abnormal mammogram with microcalcification 07/02/2014  ? Muscle cramp, nocturnal 08/20/2013  ? Hearing loss of both ears 08/19/2013   ? ? ?Allergies  ?Allergen Reactions  ? Duloxetine Nausea And Vomiting  ? Tramadol Nausea And Vomiting  ? Motrin [Ibuprofen] Rash  ? ? ?Past Surgical History:  ?Procedure Laterality Date  ? CATARACT EXTRACTION W/PHACO Right 12/07/2014  ? Procedure: CATARACT EXTRACTION PHACO AND INTRAOCULAR LENS PLACEMENT (IOC);  Surgeon: Ronnell Freshwater, MD;  Location: [place];  Service: Ophthalmology;  Laterality: Right;  DIABETIC - oral meds  ? CHOLECYSTECTOMY  2007  ? COLONOSCOPY N/A 02/08/2016  ? Procedure: COLONOSCOPY;  Surgeon: Lollie Sails, MD;  Location: [facility] ENDOSCOPY;  Service: Endoscopy;  Laterality: N/A;  Diabetes  ? MASTOIDECTOMY    ? THUMB AMPUTATION    ? removal of 2nd thumb  ? TONSILLECTOMY    ? ? ?Social History  ? ?Tobacco Use  ? Smoking status: Never  ? Smokeless tobacco: Never  ?Vaping Use  ? Vaping Use: Never used  ?Substance Use Topics  ? Alcohol use: No  ? Drug use: No  ? ? ? ?Medication list has been reviewed and updated. ? ?Current Meds  ?Medication Sig  ? acetaminophen (TYLENOL) 500 MG tablet Take 500 mg by mouth every 6 (six) hours as needed for moderate pain, fever or headache.   ? albuterol (VENTOLIN HFA) 108 (90 Base) MCG/ACT inhaler Inhale 2 puffs into the lungs every 6 (six) hours as needed for wheezing or shortness of breath.  ? amLODipine (NORVASC) 5 MG tablet Take 1 tablet (5 mg total) by mouth daily.  ? amoxicillin-clavulanate (AUGMENTIN) 875-125 MG tablet Take 1 tablet by mouth 2 (two) times daily for 10 days.  ? aspirin 81 MG tablet Take 81 mg by mouth daily. AM  ? carvedilol (COREG) 3.125 MG tablet Take 1 tablet (3.125 mg total) by mouth 2 (two) times daily with a meal.  ? diazepam (VALIUM) 2 MG tablet Take 0.5 tablets (1 mg total) by mouth every 8 (eight) hours as needed for muscle spasms.  ? ezetimibe (ZETIA) 10 MG tablet Take 1 tablet (10 mg total) by mouth daily.  ? glucose blood (ONETOUCH ULTRA) test strip USE AS DIRECTED DAILY  ? lidocaine (LIDODERM) 5 % Place 1  patch onto the skin daily. Remove & Discard patch within 12 hours or as directed by MD  ? metFORMIN (GLUCOPHAGE-XR) 500 MG 24 hr tablet Take 2 tablets (1,000 mg total) by mouth in the morning and at bedtime.  ? omeprazole (PRILOSEC) 40 MG capsule Take 1 capsule (40 mg total) by mouth daily.  ? ondansetron (ZOFRAN) 4 MG tablet Take 1 tablet (4 mg total) by mouth every 8 (eight) hours as needed for nausea or vomiting.  ? OneTouch Delica Lancets 62B MISC See admin instructions.  ? ? ? ?  07/05/2021  ?  2:02 PM 03/21/2021  ?  2:27 PM 02/21/2021  ?  2:36 PM 02/04/2021  ?  2:38 PM  ?PHQ 2/9 Scores  ?PHQ - 2 Score 0 0 0 2  ?PHQ- 9 Score 0 2 1 9   ? ? ? ?  03/21/2021  ?  2:27 PM 02/04/2021  ?  2:38 PM 12/20/2020  ?  2:01 PM  ?GAD 7 : Generalized Anxiety Score  ?Nervous, Anxious, on Edge 1 1 1   ?Control/stop worrying 1 1 1   ?Worry too much - different things 1 1 1   ?Trouble relaxing 1 1 1   ?Restless 1 1 1   ?Easily annoyed or irritable 1 1 1   ?Afraid - awful might happen 0 1 1  ?Total GAD 7 Score 6 7 7   ?Anxiety Difficulty Not difficult at all Not difficult at all   ? ? ?BP Readings from Last 3 Encounters:  ?07/05/21 128/72  ?07/03/21 139/69  ?03/21/21 122/78  ? ? ?Physical Exam ?Vitals and nursing note reviewed.  ?Constitutional:   ?   General: She is not in acute distress. ?   Appearance: Normal appearance. She is well-developed.  ?HENT:  ?   Head: Normocephalic and atraumatic.  ?Cardiovascular:  ?   Rate and Rhythm: Normal rate and regular rhythm.  ?   Pulses: Normal pulses.  ?   Heart sounds: No murmur heard. ?Pulmonary:  ?   Effort: Pulmonary effort is normal. No respiratory distress.  ?   Breath sounds: No wheezing or rhonchi.  ?Musculoskeletal:  ?   Cervical back: Normal range of motion.  ?   Right lower leg: No edema.  ?   Left lower leg: No edema.  ?Lymphadenopathy:  ?   Cervical: No cervical adenopathy.  ?Skin: ?   General: Skin is warm and dry.  ?   Findings: No rash.  ?Neurological:  ?   Mental Status: She is alert  and oriented to person, place, and time.  ?Psychiatric:     ?   Mood and Affect: Mood normal.     ?   Behavior: Behavior normal.  ? ? ?Wt Readings from Last 3 Encounters:  ?07/05/21 160 lb 13.4 oz (73 kg)  ?0

## 2021-07-20 ENCOUNTER — Encounter: Payer: Self-pay | Admitting: Internal Medicine

## 2021-07-20 ENCOUNTER — Ambulatory Visit (INDEPENDENT_AMBULATORY_CARE_PROVIDER_SITE_OTHER): Payer: Medicare Other | Admitting: Internal Medicine

## 2021-07-20 VITALS — BP 132/62 | HR 79 | Ht 64.0 in | Wt 160.0 lb

## 2021-07-20 DIAGNOSIS — Z1159 Encounter for screening for other viral diseases: Secondary | ICD-10-CM

## 2021-07-20 DIAGNOSIS — E118 Type 2 diabetes mellitus with unspecified complications: Secondary | ICD-10-CM | POA: Diagnosis not present

## 2021-07-20 DIAGNOSIS — J069 Acute upper respiratory infection, unspecified: Secondary | ICD-10-CM

## 2021-07-20 DIAGNOSIS — B9689 Other specified bacterial agents as the cause of diseases classified elsewhere: Secondary | ICD-10-CM | POA: Diagnosis not present

## 2021-07-20 DIAGNOSIS — I10 Essential (primary) hypertension: Secondary | ICD-10-CM

## 2021-07-20 MED ORDER — AMOXICILLIN-POT CLAVULANATE 875-125 MG PO TABS: 1.0000 | ORAL_TABLET | Freq: Two times a day (BID) | ORAL | 0 refills | Status: AC

## 2021-07-20 NOTE — Progress Notes (Signed)
? ? ?Date:  07/20/2021  ? ?Name:  Alison Warren   DOB:  March 02, 1946   MRN:  [id number] ? ? ?Chief Complaint: Diabetes and Hypertension ? ?Hypertension ?This is a chronic problem. The problem is controlled. Pertinent negatives include no chest pain, headaches, palpitations or shortness of breath. Past treatments include calcium channel blockers and beta blockers.  ?Diabetes ?She presents for her follow-up diabetic visit. She has type 2 diabetes mellitus. Pertinent negatives for hypoglycemia include no headaches, nervousness/anxiousness or tremors. Pertinent negatives for diabetes include no chest pain, no fatigue, no polydipsia and no polyuria. Current diabetic treatment includes oral agent (monotherapy). She is compliant with treatment all of the time.  ?Cough ?This is a new problem. The current episode started 1 to 4 weeks ago. The problem has been gradually improving. The problem occurs hourly. The cough is Productive of sputum. Pertinent negatives include no chest pain, fever, headaches, shortness of breath or wheezing. She has tried a beta-agonist inhaler (augmentin) for the symptoms.  ? ?Lab Results  ?Component Value Date  ? NA 136 07/03/2021  ? K 4.1 07/03/2021  ? CO2 24 07/03/2021  ? GLUCOSE 142 (H) 07/03/2021  ? BUN 12 07/03/2021  ? CREATININE 0.82 07/03/2021  ? CALCIUM 9.5 07/03/2021  ? EGFR 58 (L) 12/20/2020  ? GFRNONAA >60 07/03/2021  ? ?Lab Results  ?Component Value Date  ? CHOL 207 (H) 12/20/2020  ? HDL 50 12/20/2020  ? Ismay 97 12/20/2020  ? TRIG 362 (H) 12/20/2020  ? CHOLHDL 4.1 12/20/2020  ? ?No results found for: TSH ?Lab Results  ?Component Value Date  ? HGBA1C 6.2 (A) 03/21/2021  ? ?Lab Results  ?Component Value Date  ? WBC 10.1 07/03/2021  ? HGB 14.3 07/03/2021  ? HCT 44.6 07/03/2021  ? MCV 86.3 07/03/2021  ? PLT 224 07/03/2021  ? ?Lab Results  ?Component Value Date  ? ALT 36 (H) 12/20/2020  ? AST 41 (H) 12/20/2020  ? ALKPHOS 117 12/20/2020  ? BILITOT 0.3 12/20/2020  ? ?No results found for:  25OHVITD2, [place], VD25OH  ? ?Review of Systems  ?Constitutional:  Negative for appetite change, fatigue, fever and unexpected weight change.  ?HENT:  Negative for sinus pressure, tinnitus and trouble swallowing.   ?Eyes:  Negative for visual disturbance.  ?Respiratory:  Positive for cough. Negative for chest tightness, shortness of breath and wheezing.   ?Cardiovascular:  Negative for chest pain, palpitations and leg swelling.  ?Gastrointestinal:  Positive for diarrhea. Negative for abdominal pain, blood in stool and constipation.  ?Endocrine: Negative for polydipsia and polyuria.  ?Genitourinary:  Negative for dysuria and hematuria.  ?Musculoskeletal:  Negative for arthralgias.  ?Neurological:  Negative for tremors, numbness and headaches.  ?Psychiatric/Behavioral:  Negative for dysphoric mood and sleep disturbance. The patient is not nervous/anxious.   ? ?Patient Active Problem List  ? Diagnosis Date Noted  ? Cirrhosis of liver without ascites, unspecified hepatic cirrhosis type ([place]) 07/05/2021  ? Benign essential hypertension 12/20/2020  ? Degenerative disc disease, cervical 03/23/2020  ? Chronic bilateral low back pain with bilateral sciatica 02/12/2020  ? Lumbar spondylosis with myelopathy 02/09/2020  ? Aortic atherosclerosis ([place]) 01/19/2020  ? GERD (gastroesophageal reflux disease) 11/21/2018  ? Hyperlipidemia associated with type 2 diabetes mellitus ([place]) 11/21/2018  ? Localized osteoarthritis of left knee 05/09/2018  ? B12 deficiency 04/30/2018  ? Obesity (BMI 30.0-34.9) 09/14/2017  ? Hepatic steatosis 02/26/2017  ? Diverticulitis of colon 09/22/2016  ? Vitamin D deficiency 09/01/2016  ? Type II diabetes mellitus  with complication ([place]) 86/57/8469  ? Hx of adenomatous colonic polyps 02/24/2016  ? NAFLD (nonalcoholic fatty liver disease) 12/16/2015  ? Osteopenia of multiple sites 09/09/2015  ? Seasonal allergic rhinitis due to pollen 08/24/2015  ? 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 Abnormal  mammogram with microcalcification 07/02/2014  ? Muscle cramp, nocturnal 08/20/2013  ? Hearing loss of both ears 08/19/2013  ? ? ?Allergies  ?Allergen Reactions  ? Duloxetine Nausea And Vomiting  ? Tramadol Nausea And Vomiting  ? Motrin [Ibuprofen] Rash  ? ? ?Past Surgical History:  ?Procedure Laterality Date  ? CATARACT EXTRACTION W/PHACO Right 12/07/2014  ? Procedure: CATARACT EXTRACTION PHACO AND INTRAOCULAR LENS PLACEMENT (IOC);  Surgeon: Ronnell Freshwater, MD;  Location: [facility];  Service: Ophthalmology;  Laterality: Right;  DIABETIC - oral meds  ? CHOLECYSTECTOMY  2007  ? COLONOSCOPY N/A 02/08/2016  ? Procedure: COLONOSCOPY;  Surgeon: Lollie Sails, MD;  Location: [facility] ENDOSCOPY;  Service: Endoscopy;  Laterality: N/A;  Diabetes  ? MASTOIDECTOMY    ? THUMB AMPUTATION    ? removal of 2nd thumb  ? TONSILLECTOMY    ? ? ?Social History  ? ?Tobacco Use  ? Smoking status: Never  ? Smokeless tobacco: Never  ?Vaping Use  ? Vaping Use: Never used  ?Substance Use Topics  ? Alcohol use: No  ? Drug use: No  ? ? ? ?Medication list has been reviewed and updated. ? ?Current Meds  ?Medication Sig  ? acetaminophen (TYLENOL) 500 MG tablet Take 500 mg by mouth every 6 (six) hours as needed for moderate pain, fever or headache.   ? albuterol (VENTOLIN HFA) 108 (90 Base) MCG/ACT inhaler Inhale 2 puffs into the lungs every 6 (six) hours as needed for wheezing or shortness of breath.  ? amLODipine (NORVASC) 5 MG tablet Take 1 tablet (5 mg total) by mouth daily.  ? aspirin 81 MG tablet Take 81 mg by mouth daily. AM  ? carvedilol (COREG) 3.125 MG tablet Take 1 tablet (3.125 mg total) by mouth 2 (two) times daily with a meal.  ? diazepam (VALIUM) 2 MG tablet Take 0.5 tablets (1 mg total) by mouth every 8 (eight) hours as needed for muscle spasms.  ? ezetimibe (ZETIA) 10 MG tablet Take 1 tablet (10 mg total) by mouth daily.  ? glucose blood (ONETOUCH ULTRA) test strip USE AS DIRECTED DAILY  ? lidocaine (LIDODERM) 5  % Place 1 patch onto the skin daily. Remove & Discard patch within 12 hours or as directed by MD  ? metFORMIN (GLUCOPHAGE-XR) 500 MG 24 hr tablet Take 2 tablets (1,000 mg total) by mouth in the morning and at bedtime.  ? omeprazole (PRILOSEC) 40 MG capsule Take 1 capsule (40 mg total) by mouth daily.  ? ondansetron (ZOFRAN) 4 MG tablet Take 1 tablet (4 mg total) by mouth every 8 (eight) hours as needed for nausea or vomiting.  ? OneTouch Delica Lancets 62X MISC See admin instructions.  ? ? ? ?  07/20/2021  ?  1:11 PM 07/05/2021  ?  2:02 PM 03/21/2021  ?  2:27 PM 02/04/2021  ?  2:38 PM  ?GAD 7 : Generalized Anxiety Score  ?Nervous, Anxious, on Edge 0 0 1 1  ?Control/stop worrying 0 0 1 1  ?Worry too much - different things 0 0 1 1  ?Trouble relaxing 0 0 1 1  ?Restless 0 0 1 1  ?Easily annoyed or irritable 0 0 1 1  ?Afraid - awful might happen  0 0 0 1  ?Total GAD 7 Score 0 0 6 7  ?Anxiety Difficulty Not difficult at all Not difficult at all Not difficult at all Not difficult at all  ? ? ? ?  07/20/2021  ?  1:22 PM  ?Depression screen PHQ 2/9  ?Trouble concentrating 0  ? ? ?BP Readings from Last 3 Encounters:  ?07/20/21 132/62  ?07/05/21 128/72  ?07/03/21 139/69  ? ? ?Physical Exam ?Vitals and nursing note reviewed.  ?Constitutional:   ?   General: She is not in acute distress. ?   Appearance: She is well-developed.  ?HENT:  ?   Head: Normocephalic and atraumatic.  ?Cardiovascular:  ?   Rate and Rhythm: Normal rate and regular rhythm.  ?   Pulses: Normal pulses.  ?   Heart sounds: No murmur heard. ?Pulmonary:  ?   Effort: Pulmonary effort is normal. No respiratory distress.  ?   Breath sounds: Decreased breath sounds present. No wheezing or rhonchi.  ?Abdominal:  ?   General: There is no distension.  ?   Palpations: Abdomen is soft. There is no mass.  ?   Tenderness: There is no abdominal tenderness.  ?Musculoskeletal:  ?   Cervical back: Normal range of motion.  ?   Right lower leg: Edema present.  ?   Left lower leg:  Edema present.  ?Lymphadenopathy:  ?   Cervical: No cervical adenopathy.  ?Skin: ?   General: Skin is warm and dry.  ?   Findings: No rash.  ?Neurological:  ?   General: No focal deficit present.  ?   Mental S

## 2021-07-21 LAB — COMPREHENSIVE METABOLIC PANEL
ALT: 32 IU/L (ref 0–32)
AST: 32 IU/L (ref 0–40)
Albumin/Globulin Ratio: 1.5 (ref 1.2–2.2)
Albumin: 4.3 g/dL (ref 3.7–4.7)
Alkaline Phosphatase: 118 IU/L (ref 44–121)
BUN/Creatinine Ratio: 12 (ref 12–28)
BUN: 13 mg/dL (ref 8–27)
Bilirubin Total: 0.3 mg/dL (ref 0.0–1.2)
CO2: 21 mmol/L (ref 20–29)
Calcium: 9.9 mg/dL (ref 8.7–10.3)
Chloride: 105 mmol/L (ref 96–106)
Creatinine, Ser: 1.07 mg/dL — ABNORMAL HIGH (ref 0.57–1.00)
Globulin, Total: 2.8 g/dL (ref 1.5–4.5)
Glucose: 138 mg/dL — ABNORMAL HIGH (ref 70–99)
Potassium: 4.8 mmol/L (ref 3.5–5.2)
Sodium: 142 mmol/L (ref 134–144)
Total Protein: 7.1 g/dL (ref 6.0–8.5)
eGFR: 53 mL/min/{1.73_m2} — ABNORMAL LOW (ref >59)

## 2021-07-21 LAB — HEPATITIS C ANTIBODY: Hep C Virus Ab: NONREACTIVE

## 2021-07-21 LAB — HEMOGLOBIN A1C
Est. average glucose Bld gHb Est-mCnc: 157 mg/dL
Hgb A1c MFr Bld: 7.1 % — ABNORMAL HIGH (ref 4.8–5.6)

## 2021-08-12 ENCOUNTER — Other Ambulatory Visit: Payer: Self-pay | Admitting: Internal Medicine

## 2021-08-12 NOTE — Telephone Encounter (Signed)
Requested Prescriptions  ?Pending Prescriptions Disp Refills  ?? omeprazole (PRILOSEC) 40 MG capsule [Pharmacy Med Name: OMEPRAZOLE DR 40 MG CAPSULE] 90 capsule 3  ?  Sig: TAKE 1 CAPSULE (40 MG TOTAL) BY MOUTH DAILY.  ?  ? Gastroenterology: Proton Pump Inhibitors Passed - 08/12/2021  2:30 AM  ?  ?  Passed - Valid encounter within last 12 months  ?  Recent Outpatient Visits   ?      ? 3 weeks ago Benign essential hypertension  ? [facility] Glean Hess, MD  ? 1 month ago Upper respiratory tract infection, unspecified type  ? [facility] Glean Hess, MD  ? 4 months ago Benign essential hypertension  ? [facility] Glean Hess, MD  ? 6 months ago Continuous RLQ abdominal pain  ? [facility] Glean Hess, MD  ? 7 months ago Type II diabetes mellitus with complication [facility])  ? [facility] Glean Hess, MD  ?  ?  ?Future Appointments   ?        ? In 3 months Army Melia Jesse Sans, MD [facility] [place]  ?  ? ?  ?  ?  ? ?

## 2021-09-13 ENCOUNTER — Encounter: Payer: Self-pay | Admitting: Emergency Medicine

## 2021-09-13 ENCOUNTER — Other Ambulatory Visit: Payer: Self-pay

## 2021-09-13 DIAGNOSIS — Z743 Need for continuous supervision: Secondary | ICD-10-CM | POA: Diagnosis not present

## 2021-09-13 DIAGNOSIS — E119 Type 2 diabetes mellitus without complications: Secondary | ICD-10-CM | POA: Insufficient documentation

## 2021-09-13 DIAGNOSIS — R300 Dysuria: Secondary | ICD-10-CM | POA: Diagnosis not present

## 2021-09-13 DIAGNOSIS — M79604 Pain in right leg: Secondary | ICD-10-CM | POA: Insufficient documentation

## 2021-09-13 DIAGNOSIS — R21 Rash and other nonspecific skin eruption: Secondary | ICD-10-CM | POA: Insufficient documentation

## 2021-09-13 DIAGNOSIS — I10 Essential (primary) hypertension: Secondary | ICD-10-CM | POA: Insufficient documentation

## 2021-09-13 DIAGNOSIS — M7989 Other specified soft tissue disorders: Secondary | ICD-10-CM | POA: Diagnosis not present

## 2021-09-13 DIAGNOSIS — R197 Diarrhea, unspecified: Secondary | ICD-10-CM | POA: Diagnosis not present

## 2021-09-13 DIAGNOSIS — R6889 Other general symptoms and signs: Secondary | ICD-10-CM | POA: Diagnosis not present

## 2021-09-13 NOTE — ED Triage Notes (Signed)
EMS brings pt in from home for c/o itchy rash to legs after walking around outside today; applied cortisone cream without relief PTA: area of redness and blisters noted to backside of rt lower leg

## 2021-09-14 ENCOUNTER — Emergency Department
Admission: EM | Admit: 2021-09-14 | Discharge: 2021-09-14 | Disposition: A | Discharge status: Home / Self Care | Payer: Medicare Other | Attending: Emergency Medicine | Admitting: Emergency Medicine

## 2021-09-14 ENCOUNTER — Emergency Department: Payer: Medicare Other

## 2021-09-14 DIAGNOSIS — R300 Dysuria: Secondary | ICD-10-CM

## 2021-09-14 DIAGNOSIS — R21 Rash and other nonspecific skin eruption: Secondary | ICD-10-CM

## 2021-09-14 DIAGNOSIS — I10 Essential (primary) hypertension: Secondary | ICD-10-CM

## 2021-09-14 DIAGNOSIS — M7989 Other specified soft tissue disorders: Secondary | ICD-10-CM | POA: Diagnosis not present

## 2021-09-14 LAB — URINALYSIS, COMPLETE (UACMP) WITH MICROSCOPIC
Bilirubin Urine: NEGATIVE
Glucose, UA: NEGATIVE mg/dL
Hgb urine dipstick: NEGATIVE
Ketones, ur: NEGATIVE mg/dL
Leukocytes,Ua: NEGATIVE
Nitrite: NEGATIVE
Protein, ur: NEGATIVE mg/dL
Specific Gravity, Urine: 1.015 (ref 1.005–1.030)
Squamous Epithelial / HPF: NONE SEEN (ref 0–5)
pH: 5 (ref 5.0–8.0)

## 2021-09-14 MED ORDER — ACETAMINOPHEN 500 MG PO TABS
1000.0000 mg | ORAL_TABLET | Freq: Once | ORAL | Status: AC
  Administered 2021-09-14: 1000 mg via ORAL
  Filled 2021-09-14: qty 2

## 2021-09-14 MED ORDER — DOXYCYCLINE HYCLATE 100 MG PO CAPS: 100.0000 mg | ORAL_CAPSULE | Freq: Two times a day (BID) | ORAL | 0 refills | Status: AC

## 2021-09-14 MED ORDER — LORATADINE 10 MG PO TABS
10.0000 mg | ORAL_TABLET | Freq: Once | ORAL | Status: AC
  Administered 2021-09-14: 10 mg via ORAL
  Filled 2021-09-14: qty 1

## 2021-09-14 MED ORDER — CEPHALEXIN 500 MG PO CAPS: 500.0000 mg | ORAL_CAPSULE | Freq: Four times a day (QID) | ORAL | 0 refills | Status: AC

## 2021-09-14 MED ORDER — DIMETHICONE 1.25 % EX LOTN: 10.0000 mL | TOPICAL_LOTION | Freq: Two times a day (BID) | CUTANEOUS | 0 refills | Status: AC | PRN

## 2021-09-14 NOTE — ED Notes (Signed)
Assisted pt to the restroom to obtain U/A

## 2021-09-14 NOTE — ED Provider Notes (Signed)
[facility] Provider Note    Event Date/Time   First MD Initiated Contact with Patient 09/14/21 0254     (approximate)   History   Rash   HPI  Alison Warren is a 76 y.o. female with past medical history of diverticulitis, GERD, HTN, HDL, arthritis and a chronic cough as well as DM who presents for evaluation of some redness associated with pain and itching as well as some drainage of clear to yellow fluid posterior right lower calf.  Patient states that started 3 to 4 days ago.  She has been taking some topical over-the-counter cortisone cream which has not provided any significant relief.  She states she is also had a couple days of burning with urination and some nonbloody diarrhea.  No fevers, abdominal pain, back pain, chest pain, cough, shortness of breath or any other areas of redness.  No pain or weakness.  He is not sure if she could have rubbed her calf against any plant material such as poison oak or poison ivy.  No prior similar episodes.  No other acute concerns at this time.    Past Medical History:  Diagnosis Date   Arthritis    Bronchitis    none currently   Chronic cough    Costochondritis    Diabetes mellitus without complication (HCC)    8-9 years   Diverticulitis    Diverticulitis 08/2016   GERD (gastroesophageal reflux disease)    Hypercholesteremia    Hypertension    Hypomagnesemia 08/19/2014   Lactic acidosis 08/24/2016   Wears hearing aid    right ear     Physical Exam  Triage Vital Signs: ED Triage Vitals  Enc Vitals Group     BP 09/13/21 2323 (!) 149/82     Pulse Rate 09/13/21 2323 80     Resp 09/13/21 2323 20     Temp 09/13/21 2323 98.2 F (36.8 C)     Temp Source 09/13/21 2323 Oral     SpO2 09/13/21 2323 95 %     Weight 09/13/21 2324 140 lb (63.5 kg)     Height 09/13/21 2324 '5\' 4"'$  (1.626 m)     Head Circumference --      Peak Flow --      Pain Score 09/13/21 2324 10     Pain Loc --      Pain Edu? --       Excl. in [place]? --     Most recent vital signs: Vitals:   09/14/21 0305 09/14/21 0333  BP: (!) 156/94 (!) 180/87  Pulse: 68 72  Resp: 17 19  Temp:    SpO2: 97% 100%    General: Awake, no distress.  CV:  Good peripheral perfusion.  2+ radial and PT pulses. Resp:  Normal effort.  Clear bilaterally. Abd:  No distention.  Soft. Other:  No CVA tenderness.  There is an area of erythema that is blanchable with some blistering and purulence as well as clear discharge in the posterior calf extending from the mid calf to the Achilles insertion.  There is no significant film of ankle joint self and patient is forage motion of the ankle.  No other areas of redness or effusion about the knee.  He has symmetric strength in the right lower extremity compared to the left and sensation is intact light touch throughout.   ED Results / Procedures / Treatments  Labs (all labs ordered are listed, but only abnormal results are displayed)  Labs Reviewed  URINALYSIS, COMPLETE (UACMP) WITH MICROSCOPIC - Abnormal; Notable for the following components:      Result Value   Color, Urine YELLOW (*)    APPearance CLEAR (*)    Bacteria, UA RARE (*)    All other components within normal limits     EKG   RADIOLOGY Right lower extremity ultrasound my review without evidence of DVT.  Also reviewed radiology's interpretation.   PROCEDURES:  Critical Care performed: No  Procedures    MEDICATIONS ORDERED IN ED: Medications  loratadine (CLARITIN) tablet 10 mg (10 mg Oral Given 09/14/21 0321)  acetaminophen (TYLENOL) tablet 1,000 mg (1,000 mg Oral Given 09/14/21 0321)     IMPRESSION / MDM / ASSESSMENT AND PLAN / ED COURSE  I reviewed the triage vital signs and the nursing notes. Patient's presentation is most consistent with acute presentation with potential threat to life or bodily function.                               Differential diagnosis includes, but is not limited to cellulitis, dermatitis  possibly complicated by DVT.  Unclear if this related to her diarrhea or urinary symptoms although obtain a UA to assess for possible cystitis.  She has no abdominal pain fever tachycardia hypotension or any other findings of history exam to suggest serious invasive abdominal infection perforation or abscess.  Suspect likely an infectious colitis.  Right lower extremity ultrasound is negative for DVT.  No evidence on imaging or exam of an abscess and overall presentation is not suggestive of septic joint.  And concern for possible dermatitis aggravated by secondary bacterial infection given some of the purulence does appear yellow in color.  Will write short course of Doxy and Keflex to cover for strep and MRSA.  Patient did report some mild improvement in her symptoms with Tylenol and Claritin which I would not still expect improvement with antihistamine if it was purely from dermatitis.  However advised she can continue the Claritin as needed she does feel it is helping.  In the meantime we will also write Rx for topical steroid and oatmeal lotion to help soothe the area and I discussed with patient importance of keeping area clean.  UA is unremarkable for evidence of any clinically significant infection.  Unclear etiology for some burning at this time I think at this point she is appropriate for outpatient evaluation with regards to this.  Also advised patient of her blood pressure rechecked as it was noted be elevated today but otherwise she appears to be asymptomatic from this.  Patient discharged in stable condition.  Strict return precautions advised and discussed.        FINAL CLINICAL IMPRESSION(S) / ED DIAGNOSES   Final diagnoses:  Rash  Hypertension, unspecified type  Dysuria     Rx / DC Orders   ED Discharge Orders          Ordered    cephALEXin (KEFLEX) 500 MG capsule  4 times daily        09/14/21 0436    doxycycline (VIBRAMYCIN) 100 MG capsule  2 times daily        09/14/21  0436    Dimethicone 1.25 % LOTN  2 times daily PRN        09/14/21 0436             Note:  This document was prepared using Dragon voice recognition software and  may include unintentional dictation errors.   Lucrezia Starch, MD 09/14/21 [phone]

## 2021-09-14 NOTE — ED Notes (Signed)
Patient in [place].

## 2021-09-15 ENCOUNTER — Telehealth: Payer: Self-pay

## 2021-09-15 NOTE — Telephone Encounter (Signed)
Called pt she has an office visit tomorrow. Pt was seen at ED yesterday 09/14/21. She was given medication for treatment. Pt does not need to come in to her appt pt needs to completed treatment first and give [place] a call if she is still having symptoms. If pt calls back please cancel appointment.  KP

## 2021-09-15 NOTE — Telephone Encounter (Signed)
Spoke to pt let her know that the medication takes time to start working told patient after she completes the treatment if she is not better to give [place] a call. Pt verbalized understanding. Will cancel appointment.  KP

## 2021-09-15 NOTE — Telephone Encounter (Signed)
Pt called and was advise of cancelling appt / and completing treatment / pt stated she still wanted to keep her appt for tomorrow and still wanting DR. Berglunds opinion / she stated the treatment has not been helping / please advise

## 2021-09-16 ENCOUNTER — Inpatient Hospital Stay: Payer: Medicare Other | Admitting: Internal Medicine

## 2021-09-18 ENCOUNTER — Other Ambulatory Visit: Payer: Self-pay | Admitting: Internal Medicine

## 2021-09-18 DIAGNOSIS — E785 Hyperlipidemia, unspecified: Secondary | ICD-10-CM

## 2021-09-18 DIAGNOSIS — E118 Type 2 diabetes mellitus with unspecified complications: Secondary | ICD-10-CM

## 2021-09-18 DIAGNOSIS — E1169 Type 2 diabetes mellitus with other specified complication: Secondary | ICD-10-CM

## 2021-09-18 DIAGNOSIS — I10 Essential (primary) hypertension: Secondary | ICD-10-CM

## 2021-09-19 ENCOUNTER — Other Ambulatory Visit: Payer: Self-pay | Admitting: Internal Medicine

## 2021-09-19 DIAGNOSIS — E1169 Type 2 diabetes mellitus with other specified complication: Secondary | ICD-10-CM

## 2021-09-19 DIAGNOSIS — E118 Type 2 diabetes mellitus with unspecified complications: Secondary | ICD-10-CM

## 2021-09-19 DIAGNOSIS — I10 Essential (primary) hypertension: Secondary | ICD-10-CM

## 2021-09-19 NOTE — Telephone Encounter (Signed)
Copied from [place] [id number]. Topic: General - Other >> Sep 19, 2021 10:40 AM Everette C wrote: Reason for CRM: Medication Refill - Medication: metFORMIN (GLUCOPHAGE-XR) 500 MG 24 hr tablet [[id number]] - patient has 2 tablets remaining   amLODipine (NORVASC) 5 MG tablet [[id number]] - patient has 1 tablet remaining   carvedilol (COREG) 3.125 MG tablet [[id number]] - patient has 1 tablet remaining   ezetimibe (ZETIA) 10 MG tablet [[id number]] - patient has 1 tablet remaining   Has the patient contacted their pharmacy? Yes.  The patient was directed to contact their PCP (Agent: If no, request that the patient contact the pharmacy for the refill. If patient does not wish to contact the pharmacy document the reason why and proceed with request.) (Agent: If yes, when and what did the pharmacy advise?)  Preferred Pharmacy (with phone number or street name): [id number] [place] [address]Phone: [phone]: [phone]: Not open 24 hours  Has the patient been seen for an appointment in the last year OR does the patient have an upcoming appointment? Yes.    Agent: Please be advised that RX refills may take up to 3 business days. We ask that you follow-up with your pharmacy.

## 2021-09-19 NOTE — Telephone Encounter (Signed)
Requested Prescriptions  Pending Prescriptions Disp Refills  . carvedilol (COREG) 3.125 MG tablet [Pharmacy Med Name: CARVEDILOL 3.125 MG TABLET] 180 tablet 1    Sig: TAKE 1 TABLET BY MOUTH TWICE A DAY WITH A MEAL     Cardiovascular: Beta Blockers 3 Failed - 09/18/2021  1:18 AM      Failed - Cr in normal range and within 360 days    Creatinine  Date Value Ref Range Status  09/07/2013 0.89 0.60 - 1.30 mg/dL Final   Creatinine, Ser  Date Value Ref Range Status  07/20/2021 1.07 (H) 0.57 - 1.00 mg/dL Final         Failed - Last BP in normal range    BP Readings from Last 1 Encounters:  09/14/21 (!) 180/87         Passed - AST in normal range and within 360 days    AST  Date Value Ref Range Status  07/20/2021 32 0 - 40 IU/L Final   SGOT(AST)  Date Value Ref Range Status  09/06/2013 41 (H) 15 - 37 Unit/L Final         Passed - ALT in normal range and within 360 days    ALT  Date Value Ref Range Status  07/20/2021 32 0 - 32 IU/L Final   SGPT (ALT)  Date Value Ref Range Status  09/06/2013 52 12 - 78 U/L Final         Passed - Last Heart Rate in normal range    Pulse Readings from Last 1 Encounters:  09/14/21 72         Passed - Valid encounter within last 6 months    Recent Outpatient Visits          2 months ago Benign essential hypertension   [facility] Glean Hess, MD   2 months ago Upper respiratory tract infection, unspecified type   [facility] Glean Hess, MD   6 months ago Benign essential hypertension   [facility], [facility]   7 months ago Continuous RLQ abdominal pain   [facility] Glean Hess, MD   9 months ago Type II diabetes mellitus with complication [facility])   [facility] Glean Hess, MD      Future Appointments            In 2 days Glean Hess, MD [facility], [place]   In 2 months Glean Hess, MD [facility], [facility]           .  amLODipine (NORVASC) 5 MG tablet [Pharmacy Med Name: AMLODIPINE BESYLATE 5 MG TAB] 90 tablet 1    Sig: TAKE 1 TABLET (5 MG TOTAL) BY MOUTH DAILY.     Cardiovascular: Calcium Channel Blockers 2 Failed - 09/18/2021  1:18 AM      Failed - Last BP in normal range    BP Readings from Last 1 Encounters:  09/14/21 (!) 180/87         Passed - Last Heart Rate in normal range    Pulse Readings from Last 1 Encounters:  09/14/21 72         Passed - Valid encounter within last 6 months    Recent Outpatient Visits          2 months ago Benign essential hypertension   [facility] Glean Hess, MD   2 months ago Upper respiratory tract infection, unspecified type  [facility] Glean Hess, MD   6 months ago Benign essential hypertension   [facility] Glean Hess, MD   7 months ago Continuous RLQ abdominal pain   [facility] Glean Hess, MD   9 months ago Type II diabetes mellitus with complication [facility])   [facility] Glean Hess, MD      Future Appointments            In 2 days Glean Hess, MD [facility], [place]   In 2 months Glean Hess, MD [facility], [facility]           . ezetimibe (ZETIA) 10 MG tablet [Pharmacy Med Name: EZETIMIBE 10 MG TABLET] 90 tablet 1    Sig: TAKE 1 TABLET BY MOUTH EVERY DAY     Cardiovascular:  Antilipid - Sterol Transport Inhibitors Failed - 09/18/2021  1:18 AM      Failed - Lipid Panel in normal range within the last 12 months    Cholesterol, Total  Date Value Ref Range Status  12/20/2020 207 (H) 100 - 199 mg/dL Final   LDL Chol Calc (NIH)  Date Value Ref Range Status  12/20/2020 97 0 - 99 mg/dL Final   HDL  Date Value Ref Range Status  12/20/2020 50 >39 mg/dL Final   Triglycerides  Date Value Ref Range Status  12/20/2020 362 (H) 0 - 149 mg/dL Final         Passed - AST in normal range and within 360 days    AST  Date Value Ref Range  Status  07/20/2021 32 0 - 40 IU/L Final   SGOT(AST)  Date Value Ref Range Status  09/06/2013 41 (H) 15 - 37 Unit/L Final         Passed - ALT in normal range and within 360 days    ALT  Date Value Ref Range Status  07/20/2021 32 0 - 32 IU/L Final   SGPT (ALT)  Date Value Ref Range Status  09/06/2013 52 12 - 78 U/L Final         Passed - Patient is not pregnant      Passed - Valid encounter within last 12 months    Recent Outpatient Visits          2 months ago Benign essential hypertension   [facility] Glean Hess, MD   2 months ago Upper respiratory tract infection, unspecified type   [facility] Glean Hess, MD   6 months ago Benign essential hypertension   [facility], [facility]   7 months ago Continuous RLQ abdominal pain   [facility] Glean Hess, MD   9 months ago Type II diabetes mellitus with complication [facility])   [facility] Glean Hess, MD      Future Appointments            In 2 days Glean Hess, MD [facility], [place]   In 2 months Glean Hess, MD [facility], [facility]           . metFORMIN (GLUCOPHAGE-XR) 500 MG 24 hr tablet [Pharmacy Med Name: METFORMIN HCL ER 500 MG TABLET] 360 tablet 1    Sig: TAKE 2 TABLETS (1,000 MG TOTAL) BY MOUTH IN THE MORNING AND AT BEDTIME.     Endocrinology:  Diabetes - Biguanides Failed - 09/18/2021  1:18 AM  Failed - Cr in normal range and within 360 days    Creatinine  Date Value Ref Range Status  09/07/2013 0.89 0.60 - 1.30 mg/dL Final   Creatinine, Ser  Date Value Ref Range Status  07/20/2021 1.07 (H) 0.57 - 1.00 mg/dL Final         Failed - eGFR in normal range and within 360 days    EGFR (African American)  Date Value Ref Range Status  09/07/2013 >60  Final   GFR calc Af Amer  Date Value Ref Range Status  10/18/2016 >60 >60 mL/min Final    Comment:    (NOTE) The eGFR has been calculated  using the CKD EPI equation. This calculation has not been validated in all clinical situations. eGFR's persistently <60 mL/min signify possible Chronic Kidney Disease.    EGFR (Non-African Amer.)  Date Value Ref Range Status  09/07/2013 >60  Final    Comment:    eGFR values <15m/min/1.73 m2 may be an indication of chronic kidney disease (CKD). Calculated eGFR is useful in patients with stable renal function. The eGFR calculation will not be reliable in acutely ill patients when serum creatinine is changing rapidly. It is not useful in  patients on dialysis. The eGFR calculation may not be applicable to patients at the low and high extremes of body sizes, pregnant women, and vegetarians.    GFR, Estimated  Date Value Ref Range Status  07/03/2021 >60 >60 mL/min Final    Comment:    (NOTE) Calculated using the CKD-EPI Creatinine Equation (2021)    eGFR  Date Value Ref Range Status  07/20/2021 53 (L) >59 mL/min/1.73 Final         Passed - HBA1C is between 0 and 7.9 and within 180 days    Hgb A1c MFr Bld  Date Value Ref Range Status  07/20/2021 7.1 (H) 4.8 - 5.6 % Final    Comment:             Prediabetes: 5.7 - 6.4          Diabetes: >6.4          Glycemic control for adults with diabetes: <7.0          Passed - B12 Level in normal range and within 720 days    Vitamin B-12  Date Value Ref Range Status  12/20/2020 397 232 - 1,245 pg/mL Final         Passed - Valid encounter within last 6 months    Recent Outpatient Visits          2 months ago [facility] essential [facility]   [facility]   2 months ago [facility] respiratory tract infection, unspecified type   [facility]   6 months ago Benign essential hypertension   [facility]   7 months ago Continuous RLQ abdominal pain   [facility]   9 months ago Type II diabetes mellitus with  complication ([facility]   [facility]      Future Appointments            In 2 days BG[facility] Hess MD [facility] [place]  In 2 months BGlean Hess MD [facility] [place] normal limits and completed in the last 12 months  WBC  Date Value Ref Range Status  07/03/2021 10.1 4.0 - 10.5 K/uL Final   RBC  Date Value Ref Range Status  07/03/2021 5.17 (H) 3.87 - 5.11 MIL/uL Final   Hemoglobin  Date Value Ref Range Status  07/03/2021 14.3 12.0 - 15.0 g/dL Final  02/04/2021 14.6 11.1 - 15.9 g/dL Final   HCT  Date Value Ref Range Status  07/03/2021 44.6 36.0 - 46.0 % Final   Hematocrit  Date Value Ref Range Status  02/04/2021 45.3 34.0 - 46.6 % Final   MCHC  Date Value Ref Range Status  07/03/2021 32.1 30.0 - 36.0 g/dL Final   [facility]  Date Value Ref Range Status  07/03/2021 27.7 26.0 - 34.0 pg Final   MCV  Date Value Ref Range Status  07/03/2021 86.3 80.0 - 100.0 fL Final  02/04/2021 88 79 - 97 fL Final  09/07/2013 89 80 - 100 fL Final   No results found for: "PLTCOUNTKUC", "LABPLAT", "POCPLA" RDW  Date Value Ref Range Status  07/03/2021 14.4 11.5 - 15.5 % Final  02/04/2021 13.9 11.7 - 15.4 % Final  09/07/2013 14.3 11.5 - 14.5 % Final

## 2021-09-20 NOTE — Telephone Encounter (Signed)
Duplicates, all filled yesterday. Requested Prescriptions  Refused Prescriptions Disp Refills  . metFORMIN (GLUCOPHAGE-XR) 500 MG 24 hr tablet 360 tablet 1    Sig: Take 2 tablets (1,000 mg total) by mouth in the morning and at bedtime.     Endocrinology:  Diabetes - Biguanides Failed - 09/19/2021  4:14 PM      Failed - Cr in normal range and within 360 days    Creatinine  Date Value Ref Range Status  09/07/2013 0.89 0.60 - 1.30 mg/dL Final   Creatinine, Ser  Date Value Ref Range Status  07/20/2021 1.07 (H) 0.57 - 1.00 mg/dL Final         Failed - eGFR in normal range and within 360 days    EGFR (African American)  Date Value Ref Range Status  09/07/2013 >60  Final   GFR calc Af Amer  Date Value Ref Range Status  10/18/2016 >60 >60 mL/min Final    Comment:    (NOTE) The eGFR has been calculated using the CKD EPI equation. This calculation has not been validated in all clinical situations. eGFR's persistently <60 mL/min signify possible Chronic Kidney Disease.    EGFR (Non-African Amer.)  Date Value Ref Range Status  09/07/2013 >60  Final    Comment:    eGFR values <62m/min/1.73 m2 may be an indication of chronic kidney disease (CKD). Calculated eGFR is useful in patients with stable renal function. The eGFR calculation will not be reliable in acutely ill patients when serum creatinine is changing rapidly. It is not useful in  patients on dialysis. The eGFR calculation may not be applicable to patients at the low and high extremes of body sizes, pregnant women, and vegetarians.    GFR, Estimated  Date Value Ref Range Status  07/03/2021 >60 >60 mL/min Final    Comment:    (NOTE) Calculated using the CKD-EPI Creatinine Equation (2021)    eGFR  Date Value Ref Range Status  07/20/2021 53 (L) >59 mL/min/1.73 Final         Passed - HBA1C is between 0 and 7.9 and within 180 days    Hgb A1c MFr Bld  Date Value Ref Range Status  07/20/2021 7.1 (H) 4.8 - 5.6 %  Final    Comment:             Prediabetes: 5.7 - 6.4          Diabetes: >6.4          Glycemic control for adults with diabetes: <7.0          Passed - B12 Level in normal range and within 720 days    Vitamin B-12  Date Value Ref Range Status  12/20/2020 397 232 - 1,245 pg/mL Final         Passed - Valid encounter within last 6 months    Recent Outpatient Visits          2 months ago Benign essential hypertension   [facility]   2 months ago Upper respiratory tract infection, unspecified type   [facility]   6 months ago Benign essential hypertension   [facility]   7 months ago Continuous RLQ abdominal pain   [facility]   9 months ago Type II diabetes mellitus with complication ([facility]   [facility]      Future Appointments  Tomorrow Glean Hess, MD [facility], [place]   In 2 months Glean Hess, MD [facility], [place] within normal limits and completed in the last 12 months    WBC  Date Value Ref Range Status  07/03/2021 10.1 4.0 - 10.5 K/uL Final   RBC  Date Value Ref Range Status  07/03/2021 5.17 (H) 3.87 - 5.11 MIL/uL Final   Hemoglobin  Date Value Ref Range Status  07/03/2021 14.3 12.0 - 15.0 g/dL Final  02/04/2021 14.6 11.1 - 15.9 g/dL Final   HCT  Date Value Ref Range Status  07/03/2021 44.6 36.0 - 46.0 % Final   Hematocrit  Date Value Ref Range Status  02/04/2021 45.3 34.0 - 46.6 % Final   MCHC  Date Value Ref Range Status  07/03/2021 32.1 30.0 - 36.0 g/dL Final   [employer]  Date Value Ref Range Status  07/03/2021 27.7 26.0 - 34.0 pg Final   MCV  Date Value Ref Range Status  07/03/2021 86.3 80.0 - 100.0 fL Final  02/04/2021 88 79 - 97 fL Final  09/07/2013 89 80 - 100 fL Final   No results found for: "PLTCOUNTKUC", "LABPLAT",  "POCPLA" RDW  Date Value Ref Range Status  07/03/2021 14.4 11.5 - 15.5 % Final  02/04/2021 13.9 11.7 - 15.4 % Final  09/07/2013 14.3 11.5 - 14.5 % Final         . amLODipine (NORVASC) 5 MG tablet 90 tablet 1    Sig: Take 1 tablet (5 mg total) by mouth daily.     Cardiovascular: Calcium Channel Blockers 2 Failed - 09/19/2021  4:14 PM      Failed - Last BP in normal range    BP Readings from Last 1 Encounters:  09/14/21 (!) 180/87         Passed - Last Heart Rate in normal range    Pulse Readings from Last 1 Encounters:  09/14/21 72         Passed - Valid encounter within last 6 months    Recent Outpatient Visits          2 months ago Benign essential hypertension   [facility] Glean Hess, MD   2 months ago Upper respiratory tract infection, unspecified type   [facility] Gle[facility], MD   6 months ago Benign essential hypertension   [facility] Glean Hess, MD   7 months ago Continuous RLQ abdominal pain   [facility] Glean Hess, MD   9 months ago Type II diabetes mellitus with complication [facility])   [place], MD      Future Appointments            Tomorrow Glean Hess, MD [facility], [place]   In 2 months Glean Hess, MD [facility], [facility]           . carvedilol (COREG) 3.125 MG tablet 180 tablet 1     Cardiovascular: Beta Blockers 3 Failed - 09/19/2021  4:14 PM      Failed - Cr in normal range and within 360 days    Creatinine  Date Value Ref Range Status  09/07/2013 0.89 0.60 - 1.30 mg/dL Final   Creatinine, Ser  Date Value Ref Range Status  07/20/2021 1.07 (H) 0.57 - 1.00 mg/dL Final  Failed - Last BP in normal range    BP Readings from Last 1 Encounters:  09/14/21 (!) 180/87         Passed - AST in normal range and within 360 days    AST  Date Value Ref Range Status  07/20/2021 32 0 - 40 IU/L Final   SGOT(AST)   Date Value Ref Range Status  09/06/2013 41 (H) 15 - 37 Unit/L Final         Passed - ALT in normal range and within 360 days    ALT  Date Value Ref Range Status  07/20/2021 32 0 - 32 IU/L Final   SGPT (ALT)  Date Value Ref Range Status  09/06/2013 52 12 - 78 U/L Final         Passed - Last Heart Rate in normal range    Pulse Readings from Last 1 Encounters:  09/14/21 72         Passed - Valid encounter within last 6 months    Recent Outpatient Visits          2 months ago Benign essential hypertension   [facility] Glean Hess, MD   2 months ago Upper respiratory tract infection, unspecified type   [facility] Glean Hess, MD   6 months ago Benign essential hypertension   [facility] Glean Hess, MD   7 months ago Continuous RLQ abdominal pain   [facility] Glean Hess, MD   9 months ago Type II diabetes mellitus with complication [facility])   [facility] Glean Hess, MD      Future Appointments            Tomorrow Glean Hess, MD [place]   In 2 months Glean Hess, MD [facility], [facility]           . ezetimibe (ZETIA) 10 MG tablet 90 tablet 1    Sig: Take 1 tablet (10 mg total) by mouth daily.     Cardiovascular:  Antilipid - Sterol Transport Inhibitors Failed - 09/19/2021  4:14 PM      Failed - Lipid Panel in normal range within the last 12 months    Cholesterol, Total  Date Value Ref Range Status  12/20/2020 207 (H) 100 - 199 mg/dL Final   LDL Chol Calc (NIH)  Date Value Ref Range Status  12/20/2020 97 0 - 99 mg/dL Final   HDL  Date Value Ref Range Status  12/20/2020 50 >39 mg/dL Final   Triglycerides  Date Value Ref Range Status  12/20/2020 362 (H) 0 - 149 mg/dL Final         Passed - AST in normal range and within 360 days    AST  Date Value Ref Range Status  07/20/2021 32 0 - 40 IU/L Final   SGOT(AST)  Date Value Ref Range  Status  09/06/2013 41 (H) 15 - 37 Unit/L Final         Passed - ALT in normal range and within 360 days    ALT  Date Value Ref Range Status  07/20/2021 32 0 - 32 IU/L Final   SGPT (ALT)  Date Value Ref Range Status  09/06/2013 52 12 - 78 U/L Final         Passed - Patient is not pregnant      Passed - Valid encounter within last 12 months    Recent Outpatient Visits  2 months ago Benign essential hypertension   [facility] Glean Hess, MD   2 months ago Upper respiratory tract infection, unspecified type   [facility] Glean Hess, MD   6 months ago Benign essential hypertension   [facility] Glean Hess, MD   7 months ago Continuous RLQ abdominal pain   [facility] Glean [facility], MD   9 months ago Type II diabetes mellitus with complication [facility])   [facility] Glean Hess, MD      Future Appointments            Tomorrow Glean Hess, MD [facility], [place]   In 2 months [place] Mel[place] Jesse Sans, MD [facility], [facility]

## 2021-09-21 ENCOUNTER — Encounter: Payer: Self-pay | Admitting: Internal Medicine

## 2021-09-21 ENCOUNTER — Ambulatory Visit (INDEPENDENT_AMBULATORY_CARE_PROVIDER_SITE_OTHER): Payer: Medicare Other | Admitting: Internal Medicine

## 2021-09-21 VITALS — BP 134/78 | HR 74 | Ht 64.0 in | Wt 158.0 lb

## 2021-09-21 DIAGNOSIS — L03115 Cellulitis of right lower limb: Secondary | ICD-10-CM

## 2021-09-21 DIAGNOSIS — I10 Essential (primary) hypertension: Secondary | ICD-10-CM | POA: Diagnosis not present

## 2021-09-21 MED ORDER — ALBUTEROL SULFATE HFA 108 (90 BASE) MCG/ACT IN AERS
2.0000 | INHALATION_SPRAY | Freq: Four times a day (QID) | RESPIRATORY_TRACT | 2 refills | Status: DC | PRN
Start: 2021-09-21 — End: 2022-03-18

## 2021-09-21 NOTE — Progress Notes (Signed)
Date:  09/21/2021   Name:  Alison Warren   DOB:  [birth year]   MRN:  [id number]   Chief Complaint: Hypertension and Rash  Hypertension This is a chronic problem. The problem is controlled. Pertinent negatives include no chest pain, headaches, palpitations or shortness of breath. Past treatments include calcium channel blockers.  Rash This is a new problem. The current episode started in the past 7 days. The problem has been rapidly improving since onset. The affected locations include the right lower leg. The rash is characterized by redness (with no more drainage - eschar formation). Pertinent negatives include no cough, diarrhea, fatigue or shortness of breath. Past treatments include antibiotic cream (finished Keflex).    Lab Results  Component Value Date   NA 142 07/20/2021   K 4.8 07/20/2021   CO2 21 07/20/2021   GLUCOSE 138 (H) 07/20/2021   BUN 13 07/20/2021   CREATININE 1.07 (H) 07/20/2021   CALCIUM 9.9 07/20/2021   EGFR 53 (L) 07/20/2021   GFRNONAA >60 07/03/2021   Lab Results  Component Value Date   CHOL 207 (H) 12/20/2020   HDL 50 12/20/2020   LDLCALC 97 12/20/2020   TRIG 362 (H) 12/20/2020   CHOLHDL 4.1 12/20/2020   No results found for: "TSH" Lab Results  Component Value Date   HGBA1C 7.1 (H) 07/20/2021   Lab Results  Component Value Date   WBC 10.1 07/03/2021   HGB 14.3 07/03/2021   HCT 44.6 07/03/2021   MCV 86.3 07/03/2021   PLT 224 07/03/2021   Lab Results  Component Value Date   ALT 32 07/20/2021   AST 32 07/20/2021   ALKPHOS 118 07/20/2021   BILITOT 0.3 07/20/2021   No results found for: "25OHVITD2", "25OHVITD3", "VD25OH"   Review of Systems  Constitutional:  Negative for chills, fatigue and unexpected weight change.  HENT:  Negative for nosebleeds.   Eyes:  Negative for visual disturbance.  Respiratory:  Negative for cough, chest tightness, shortness of breath and wheezing.   Cardiovascular:  Negative for chest pain, palpitations and  leg swelling.  Gastrointestinal:  Negative for abdominal pain, constipation and diarrhea.  Skin:  Positive for rash.  Neurological:  Negative for dizziness, weakness, light-headedness and headaches.    Patient Active Problem List   Diagnosis Date Noted   Cirrhosis of liver without ascites, unspecified hepatic cirrhosis type ([place]) 07/05/2021   Benign essential hypertension 12/20/2020   Degenerative disc disease, cervical 03/23/2020   Chronic bilateral low back pain with bilateral sciatica 02/12/2020   Lumbar spondylosis with myelopathy 02/09/2020   Aortic atherosclerosis ([place]) 01/19/2020   GERD (gastroesophageal reflux disease) 11/21/2018   Hyperlipidemia associated with type 2 diabetes mellitus ([place]) 11/21/2018   Localized osteoarthritis of left knee 05/09/2018   B12 deficiency 04/30/2018   Obesity (BMI 30.0-34.9) 09/14/2017   Hepatic steatosis 02/26/2017   Diverticulitis of colon 09/22/2016   Vitamin D deficiency 09/01/2016   Type II diabetes mellitus with complication ([place]) 40/98/1191   Hx of adenomatous colonic polyps 02/24/2016   NAFLD (nonalcoholic fatty liver disease) 12/16/2015   Osteopenia of multiple sites 09/09/2015   Seasonal allergic rhinitis due to pollen 08/24/2015   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/2015   Abnormal mammogram with microcalcification 07/02/2014   Muscle cramp, nocturnal 08/20/2013   Hearing loss of both ears 08/19/2013    Allergies  Allergen Reactions   Duloxetine Nausea And Vomiting   Tramadol Nausea And Vomiting   Motrin [Ibuprofen] Rash    Past Surgical History:  Procedure Laterality Date   CATARACT EXTRACTION W/PHACO Right 12/07/2014   Procedure: CATARACT EXTRACTION PHACO AND INTRAOCULAR LENS PLACEMENT (IOC);  Surgeon: Ronnell Freshwater, MD;  Location: [place];  Service: Ophthalmology;  Laterality: Right;  DIABETIC - oral meds   CHOLECYSTECTOMY  2007   COLONOSCOPY N/A 02/08/2016   Procedure: COLONOSCOPY;  Surgeon:  Lollie Sails, MD;  Location: [facility] ENDOSCOPY;  Service: Endoscopy;  Laterality: N/A;  Diabetes   MASTOIDECTOMY     THUMB AMPUTATION     removal of 2nd thumb   TONSILLECTOMY      Social History   Tobacco Use   Smoking status: Never   Smokeless tobacco: Never  Vaping Use   Vaping Use: Never used  Substance Use Topics   Alcohol use: No   Drug use: No     Medication list has been reviewed and updated.  Current Meds  Medication Sig   acetaminophen (TYLENOL) 500 MG tablet Take 500 mg by mouth every 6 (six) hours as needed for moderate pain, fever or headache.    amLODipine (NORVASC) 5 MG tablet TAKE 1 TABLET (5 MG TOTAL) BY MOUTH DAILY.   aspirin 81 MG tablet Take 81 mg by mouth daily. AM   carvedilol (COREG) 3.125 MG tablet TAKE 1 TABLET BY MOUTH TWICE A DAY WITH A MEAL   diazepam (VALIUM) 2 MG tablet Take 0.5 tablets (1 mg total) by mouth every 8 (eight) hours as needed for muscle spasms.   ezetimibe (ZETIA) 10 MG tablet TAKE 1 TABLET BY MOUTH EVERY DAY   glucose blood (ONETOUCH ULTRA) test strip USE AS DIRECTED DAILY   lidocaine (LIDODERM) 5 % Place 1 patch onto the skin daily. Remove & Discard patch within 12 hours or as directed by MD   metFORMIN (GLUCOPHAGE-XR) 500 MG 24 hr tablet TAKE 2 TABLETS (1,000 MG TOTAL) BY MOUTH IN THE MORNING AND AT BEDTIME.   omeprazole (PRILOSEC) 40 MG capsule TAKE 1 CAPSULE (40 MG TOTAL) BY MOUTH DAILY.   ondansetron (ZOFRAN) 4 MG tablet Take 1 tablet (4 mg total) by mouth every 8 (eight) hours as needed for nausea or vomiting.   OneTouch Delica Lancets 10X MISC See admin instructions.   [DISCONTINUED] albuterol (VENTOLIN HFA) 108 (90 Base) MCG/ACT inhaler Inhale 2 puffs into the lungs every 6 (six) hours as needed for wheezing or shortness of breath.       09/21/2021    2:35 PM 07/20/2021    1:11 PM 07/05/2021    2:02 PM 03/21/2021    2:27 PM  GAD 7 : Generalized Anxiety Score  Nervous, Anxious, on Edge 0 0 0 1  Control/stop worrying 0 0  0 1  Worry too much - different things 0 0 0 1  Trouble relaxing 0 0 0 1  Restless 0 0 0 1  Easily annoyed or irritable 0 0 0 1  Afraid - awful might happen 0 0 0 0  Total GAD 7 Score 0 0 0 6  Anxiety Difficulty Not difficult at all Not difficult at all Not difficult at all Not difficult at all       09/21/2021    2:35 PM  Depression screen PHQ 2/9  Decreased Interest 0  Down, Depressed, Hopeless 0  PHQ - 2 Score 0  Altered sleeping 0  Tired, decreased energy 0  Change in appetite 0  Feeling bad or failure about yourself  0  Trouble concentrating 0  Moving slowly or fidgety/restless 0  Suicidal thoughts  0  PHQ-9 Score 0  Difficult doing work/chores Not difficult at all    BP Readings from Last 3 Encounters:  09/21/21 134/78  09/14/21 (!) 180/87  07/20/21 132/62    Physical Exam Vitals and nursing note reviewed.  Constitutional:      General: She is not in acute distress.    Appearance: She is well-developed.  HENT:     Head: Normocephalic and atraumatic.  Cardiovascular:     Rate and Rhythm: Normal rate and regular rhythm.  Pulmonary:     Effort: Pulmonary effort is normal. No respiratory distress.     Breath sounds: No wheezing or rhonchi.  Musculoskeletal:     Right lower leg: No edema.     Left lower leg: No edema.  Skin:    General: Skin is warm and dry.     Findings: Rash present.          Comments: Healing area of previous cellulitis with thin eschar  Neurological:     Mental Status: She is alert and oriented to person, place, and time.  Psychiatric:        Mood and Affect: Mood normal.        Behavior: Behavior normal.     Wt Readings from Last 3 Encounters:  09/21/21 158 lb (71.7 kg)  09/13/21 140 lb (63.5 kg)  07/20/21 160 lb (72.6 kg)    BP 134/78 (BP Location: Left Arm, Cuff Size: Normal)   Pulse 74   Ht 5' 4"  (1.626 m)   Wt 158 lb (71.7 kg)   SpO2 96%   BMI 27.12 kg/m   Assessment and Plan: 1. Benign essential  hypertension Clinically stable exam with well controlled BP. Tolerating medications without side effects at this time. Pt to continue current regimen and low sodium diet; benefits of regular exercise as able discussed.  2. Cellulitis of right lower extremity Seen in ED - [place] and labs normal Healing well after a course of Keflex and Doxycycline   Partially dictated using Editor, commissioning. Any errors are unintentional.  Halina Maidens, MD [facility]  09/21/2021

## 2021-09-27 ENCOUNTER — Ambulatory Visit: Payer: Medicare Other | Admitting: Podiatry

## 2021-09-27 DIAGNOSIS — B351 Tinea unguium: Secondary | ICD-10-CM

## 2021-09-27 DIAGNOSIS — M79675 Pain in left toe(s): Secondary | ICD-10-CM | POA: Diagnosis not present

## 2021-09-27 DIAGNOSIS — M79674 Pain in right toe(s): Secondary | ICD-10-CM

## 2021-09-27 NOTE — Progress Notes (Signed)
   SUBJECTIVE Patient with a history of diabetes mellitus presents to office today complaining of elongated, thickened nails that cause pain while ambulating in shoes.  Patient is unable to trim their own nails. Patient is here for further evaluation and treatment.   Past Medical History:  Diagnosis Date   Arthritis    Bronchitis    none currently   Chronic cough    Costochondritis    Diabetes mellitus without complication ([place])    8-9 years   Diverticulitis    Diverticulitis 08/2016   GERD (gastroesophageal reflux disease)    Hypercholesteremia    Hypertension    Hypomagnesemia 08/19/2014   Lactic acidosis 08/24/2016   Wears hearing aid    right ear    OBJECTIVE General Patient is awake, alert, and oriented x 3 and in no acute distress. Derm Skin is dry and supple bilateral. Negative open lesions or macerations. Remaining integument unremarkable. Nails are tender, long, thickened and dystrophic with subungual debris, consistent with onychomycosis, 1-5 bilateral. No signs of infection noted. Vasc  DP and PT pedal pulses palpable bilaterally. Temperature gradient within normal limits.  Neuro Epicritic and protective threshold sensation diminished bilaterally.  Musculoskeletal Exam Oligodactyly noted right foot.  Asymptomatic  ASSESSMENT 1. Diabetes Mellitus w/ peripheral neuropathy 2.  Pain due to onychomycosis of toenails bilateral  PLAN OF CARE 1. Patient evaluated today.   2. Instructed to maintain good pedal hygiene and foot care. Stressed importance of controlling blood sugar.  3. Mechanical debridement of nails 1-5 bilaterally performed using a nail nipper. Filed with dremel without incident.  4. Return to clinic in 3 mos.     Edrick Kins, DPM [facility]  Dr. Edrick Kins, DPM    [address], [place] [address]                Office [phone]  Fax [phone]

## 2021-11-23 ENCOUNTER — Encounter: Payer: Self-pay | Admitting: Internal Medicine

## 2021-11-23 ENCOUNTER — Ambulatory Visit (INDEPENDENT_AMBULATORY_CARE_PROVIDER_SITE_OTHER): Payer: Medicare Other | Admitting: Internal Medicine

## 2021-11-23 VITALS — BP 128/74 | HR 74 | Ht 64.0 in | Wt 160.8 lb

## 2021-11-23 DIAGNOSIS — E1169 Type 2 diabetes mellitus with other specified complication: Secondary | ICD-10-CM | POA: Diagnosis not present

## 2021-11-23 DIAGNOSIS — E785 Hyperlipidemia, unspecified: Secondary | ICD-10-CM | POA: Diagnosis not present

## 2021-11-23 DIAGNOSIS — I10 Essential (primary) hypertension: Secondary | ICD-10-CM | POA: Diagnosis not present

## 2021-11-23 DIAGNOSIS — E559 Vitamin D deficiency, unspecified: Secondary | ICD-10-CM | POA: Diagnosis not present

## 2021-11-23 DIAGNOSIS — D485 Neoplasm of uncertain behavior of skin: Secondary | ICD-10-CM | POA: Diagnosis not present

## 2021-11-23 DIAGNOSIS — E118 Type 2 diabetes mellitus with unspecified complications: Secondary | ICD-10-CM | POA: Diagnosis not present

## 2021-11-23 DIAGNOSIS — R051 Acute cough: Secondary | ICD-10-CM

## 2021-11-23 MED ORDER — ONETOUCH DELICA LANCETS 33G MISC: 1.0000 | 3 refills | Status: DC

## 2021-11-23 MED ORDER — ONETOUCH ULTRA VI STRP: ORAL_STRIP | 3 refills | Status: AC

## 2021-11-23 MED ORDER — DOXYCYCLINE HYCLATE 100 MG PO TABS: 100.0000 mg | ORAL_TABLET | Freq: Two times a day (BID) | ORAL | 0 refills | Status: AC

## 2021-11-23 NOTE — Progress Notes (Signed)
Date:  11/23/2021   Name:  Alison Warren   DOB:  Dec 10, 1945   MRN:  [id number]   Chief Complaint: Hypertension and Diabetes  Diabetes She has type 2 diabetes mellitus. Her disease course has been stable. Pertinent negatives for hypoglycemia include no dizziness or headaches. Pertinent negatives for diabetes include no chest pain, no fatigue and no weakness. Symptoms are stable. Current diabetic treatment includes oral agent (monotherapy) (metformin). Her breakfast blood glucose is taken between 6-7 am. Her breakfast blood glucose range is generally 90-110 mg/dl.  Hypertension This is a chronic problem. The problem is controlled (at home 130/75). Pertinent negatives include no chest pain, headaches, palpitations or shortness of breath. Past treatments include calcium channel blockers and beta blockers. The current treatment provides moderate improvement. Hypertensive end-organ damage includes kidney disease.  Hyperlipidemia This is a chronic problem. The problem is resistant. Pertinent negatives include no chest pain or shortness of breath. Current antihyperlipidemic treatment includes ezetimibe.  Cough This is a new problem. The current episode started yesterday. The problem has been unchanged. The problem occurs every few minutes. The cough is Non-productive. Pertinent negatives include no chest pain, fever, headaches, shortness of breath or wheezing. Associated symptoms comments: Fatigue and congestion .    Lab Results  Component Value Date   NA 142 07/20/2021   K 4.8 07/20/2021   CO2 21 07/20/2021   GLUCOSE 138 (H) 07/20/2021   BUN 13 07/20/2021   CREATININE 1.07 (H) 07/20/2021   CALCIUM 9.9 07/20/2021   EGFR 53 (L) 07/20/2021   GFRNONAA >60 07/03/2021   Lab Results  Component Value Date   CHOL 207 (H) 12/20/2020   HDL 50 12/20/2020   LDLCALC 97 12/20/2020   TRIG 362 (H) 12/20/2020   CHOLHDL 4.1 12/20/2020   No results found for: "TSH" Lab Results  Component Value Date    HGBA1C 7.1 (H) 07/20/2021   Lab Results  Component Value Date   WBC 10.1 07/03/2021   HGB 14.3 07/03/2021   HCT 44.6 07/03/2021   MCV 86.3 07/03/2021   PLT 224 07/03/2021   Lab Results  Component Value Date   ALT 32 07/20/2021   AST 32 07/20/2021   ALKPHOS 118 07/20/2021   BILITOT 0.3 07/20/2021   No results found for: "25OHVITD2", "25OHVITD3", "VD25OH"   Review of Systems  Constitutional:  Negative for fatigue, fever and unexpected weight change.  HENT:  Negative for nosebleeds.   Eyes:  Negative for visual disturbance.  Respiratory:  Positive for cough. Negative for chest tightness, shortness of breath and wheezing.   Cardiovascular:  Negative for chest pain, palpitations and leg swelling.  Gastrointestinal:  Negative for abdominal pain, constipation and diarrhea.  Skin:        Raised lesion on right arm  Neurological:  Negative for dizziness, weakness, light-headedness and headaches.    Patient Active Problem List   Diagnosis Date Noted   Cirrhosis of liver without ascites, unspecified hepatic cirrhosis type ([place]) 07/05/2021   Benign essential hypertension 12/20/2020   Degenerative disc disease, cervical 03/23/2020   Chronic bilateral low back pain with bilateral sciatica 02/12/2020   Lumbar spondylosis with myelopathy 02/09/2020   Aortic atherosclerosis ([place]) 01/19/2020   GERD (gastroesophageal reflux disease) 11/21/2018   Hyperlipidemia associated with type 2 diabetes mellitus ([place]) 11/21/2018   Localized osteoarthritis of left knee 05/09/2018   B12 deficiency 04/30/2018   Obesity (BMI 30.0-34.9) 09/14/2017   Hepatic steatosis 02/26/2017   Diverticulitis of colon 09/22/2016   Vitamin  D deficiency 09/01/2016   Type II diabetes mellitus with complication ([place]) 14/43/1540   Hx of adenomatous colonic polyps 02/24/2016   NAFLD (nonalcoholic fatty liver disease) 12/16/2015   Osteopenia of multiple sites 09/09/2015   Seasonal allergic rhinitis due to pollen  08/24/2015   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/2015   Abnormal mammogram with microcalcification 07/02/2014   Muscle cramp, nocturnal 08/20/2013   Hearing loss of both ears 08/19/2013    Allergies  Allergen Reactions   Duloxetine Nausea And Vomiting   Tramadol Nausea And Vomiting   Motrin [Ibuprofen] Rash    Past Surgical History:  Procedure Laterality Date   CATARACT EXTRACTION W/PHACO Right 12/07/2014   Procedure: CATARACT EXTRACTION PHACO AND INTRAOCULAR LENS PLACEMENT ([place]);  Surgeon: Ronnell Freshwater, MD;  Location: [place];  Service: Ophthalmology;  Laterality: Right;  DIABETIC - oral meds   CHOLECYSTECTOMY  2007   COLONOSCOPY N/A 02/08/2016   Procedure: COLONOSCOPY;  Surgeon: Lollie Sails, MD;  [facility] ENDOSCOPY;  Service: Endoscopy;  Laterality: N/A;  Diabetes   MASTOIDECTOMY     THUMB AMPUTATION     removal of 2nd thumb   TONSILLECTOMY      Social History   Tobacco Use   Smoking status: Never   Smokeless tobacco: Never  Vaping Use   Vaping Use: Never used  Substance Use Topics   Alcohol use: No   Drug use: No     Medication list has been reviewed and updated.  Current Meds  Medication Sig   acetaminophen (TYLENOL) 500 MG tablet Take 500 mg by mouth every 6 (six) hours as needed for moderate pain, fever or headache.    albuterol (VENTOLIN HFA) 108 (90 Base) MCG/ACT inhaler Inhale 2 puffs into the lungs every 6 (six) hours as needed for wheezing or shortness of breath.   amLODipine (NORVASC) 5 MG tablet TAKE 1 TABLET (5 MG TOTAL) BY MOUTH DAILY.   aspirin 81 MG tablet Take 81 mg by mouth daily. AM   carvedilol (COREG) 3.125 MG tablet TAKE 1 TABLET BY MOUTH TWICE A DAY WITH A MEAL   ezetimibe (ZETIA) 10 MG tablet TAKE 1 TABLET BY MOUTH EVERY DAY   glucose blood (ONETOUCH ULTRA) test strip USE AS DIRECTED DAILY   lidocaine (LIDODERM) 5 % Place 1 patch onto the skin daily. Remove & Discard patch within 12 hours or as directed by  MD   metFORMIN (GLUCOPHAGE-XR) 500 MG 24 hr tablet TAKE 2 TABLETS (1,000 MG TOTAL) BY MOUTH IN THE MORNING AND AT BEDTIME.   omeprazole (PRILOSEC) 40 MG capsule TAKE 1 CAPSULE (40 MG TOTAL) BY MOUTH DAILY.   ondansetron (ZOFRAN) 4 MG tablet Take 1 tablet (4 mg total) by mouth every 8 (eight) hours as needed for nausea or vomiting.   OneTouch Delica Lancets 08Q MISC See admin instructions.   [DISCONTINUED] diazepam (VALIUM) 2 MG tablet Take 0.5 tablets (1 mg total) by mouth every 8 (eight) hours as needed for muscle spasms.       11/23/2021    1:25 PM 09/21/2021    2:35 PM 07/20/2021    1:11 PM 07/05/2021    2:02 PM  GAD 7 : Generalized Anxiety Score  Nervous, Anxious, on Edge 0 0 0 0  Control/stop worrying 0 0 0 0  Worry too much - different things 0 0 0 0  Trouble relaxing 0 0 0 0  Restless 0 0 0 0  Easily annoyed or irritable 0 0 0 0  Afraid -  awful might happen 0 0 0 0  Total GAD 7 Score 0 0 0 0  Anxiety Difficulty Not difficult at all Not difficult at all Not difficult at all Not difficult at all       11/23/2021    1:24 PM 09/21/2021    2:35 PM 07/20/2021    1:22 PM  Depression screen PHQ 2/9  Decreased Interest 0 0   Down, Depressed, Hopeless 0 0   PHQ - 2 Score 0 0   Altered sleeping 0 0   Tired, decreased energy 0 0   Change in appetite 0 0   Feeling bad or failure about yourself  0 0   Trouble concentrating 0 0 0  Moving slowly or fidgety/restless 0 0   Suicidal thoughts 0 0   PHQ-9 Score 0 0   Difficult doing work/chores Not difficult at all Not difficult at all     BP Readings from Last 3 Encounters:  11/23/21 128/74  09/21/21 134/78  09/14/21 (!) 180/87    Physical Exam Vitals and nursing note reviewed.  Constitutional:      General: She is not in acute distress.    Appearance: Normal appearance. She is well-developed.  HENT:     Head: Normocephalic and atraumatic.  Eyes:     Extraocular Movements:     Right eye: Abnormal extraocular motion present.      Left eye: Abnormal extraocular motion present.  Cardiovascular:     Rate and Rhythm: Normal rate and regular rhythm.     Heart sounds: Normal heart sounds.  Pulmonary:     Effort: Pulmonary effort is normal. No respiratory distress.     Breath sounds: Examination of the right-upper field reveals decreased breath sounds. Examination of the left-upper field reveals decreased breath sounds. Decreased breath sounds present.  Musculoskeletal:     Cervical back: Normal range of motion.  Skin:    General: Skin is warm and dry.     Findings: No rash.          Comments: 3 mm raised warty lesion   Neurological:     Mental Status: She is alert and oriented to person, place, and time.  Psychiatric:        Mood and Affect: Mood normal.        Behavior: Behavior normal.     Wt Readings from Last 3 Encounters:  11/23/21 160 lb 12.8 oz (72.9 kg)  09/21/21 158 lb (71.7 kg)  09/13/21 140 lb (63.5 kg)    BP 128/74   Pulse 74   Ht _0  (1.626 m)   Wt 160 lb 12.8 oz (72.9 kg)   SpO2 96%   BMI 27.60 kg/m   Assessment and Plan: 1. Benign essential hypertension Clinically stable exam with well controlled BP. Tolerating medications without side effects at this time. Pt to continue current regimen and low sodium diet; benefits of regular exercise as able discussed.  2. Type II diabetes mellitus with complication ([place]) Clinically stable by exam and report without s/s of hypoglycemia. DM complicated by hypertension and dyslipidemia. Tolerating medications well without side effects or other concerns. No further eye exams due to lack of available treatment. - Comprehensive metabolic panel - Hemoglobin Z6X - OneTouch Delica Lancets 09U MISC; 1 each by Does not apply route See admin instructions.  Dispense: 100 each; Refill: 3 - glucose blood (ONETOUCH ULTRA) test strip; Use once daily  Dispense: 100 each; Refill: 3  3. Hyperlipidemia associated with type 2 diabetes  mellitus  ([place]) Tolerating statin medication without side effects at this time LDL is at goal of < 70 on current dose Continue same therapy without change at this time. - Lipid panel  4. Vitamin D deficiency Check levels and advise on supplementation - VITAMIN D 25 Hydroxy (Vit-D Deficiency, Fractures)  5. Neoplasm of uncertain behavior of skin Likely [place].  6. Acute cough Concern for bronchitis; get CBC Treat empirically with Doxy - CBC with Differential/Platelet - doxycycline (VIBRA-TABS) 100 MG tablet; Take 1 tablet (100 mg total) by mouth 2 (two) times daily for 10 days.  Dispense: 20 tablet; Refill: 0   Partially dictated using Editor, commissioning. Any errors are unintentional.  Halina Maidens, MD [facility]  11/23/2021

## 2021-11-24 LAB — CBC WITH DIFFERENTIAL/PLATELET
Basophils Absolute: 0.1 10*3/uL (ref 0.0–0.2)
Basos: 1 %
EOS (ABSOLUTE): 0.1 10*3/uL (ref 0.0–0.4)
Eos: 1 %
Hematocrit: 42.9 % (ref 34.0–46.6)
Hemoglobin: 13.9 g/dL (ref 11.1–15.9)
Immature Grans (Abs): 0 10*3/uL (ref 0.0–0.1)
Immature Granulocytes: 0 %
Lymphocytes Absolute: 2.9 10*3/uL (ref 0.7–3.1)
Lymphs: 38 %
MCH: 28.2 pg (ref 26.6–33.0)
MCHC: 32.4 g/dL (ref 31.5–35.7)
MCV: 87 fL (ref 79–97)
Monocytes Absolute: 0.5 10*3/uL (ref 0.1–0.9)
Monocytes: 7 %
Neutrophils Absolute: 4 10*3/uL (ref 1.4–7.0)
Neutrophils: 53 %
Platelets: 226 10*3/uL (ref 150–450)
RBC: 4.93 x10E6/uL (ref 3.77–5.28)
RDW: 13.7 % (ref 11.7–15.4)
WBC: 7.7 10*3/uL (ref 3.4–10.8)

## 2021-11-24 LAB — COMPREHENSIVE METABOLIC PANEL
ALT: 33 IU/L — ABNORMAL HIGH (ref 0–32)
AST: 39 IU/L (ref 0–40)
Albumin/Globulin Ratio: 1.5 (ref 1.2–2.2)
Albumin: 4.2 g/dL (ref 3.8–4.8)
Alkaline Phosphatase: 97 IU/L (ref 44–121)
BUN/Creatinine Ratio: 11 — ABNORMAL LOW (ref 12–28)
BUN: 8 mg/dL (ref 8–27)
Bilirubin Total: 0.3 mg/dL (ref 0.0–1.2)
CO2: 19 mmol/L — ABNORMAL LOW (ref 20–29)
Calcium: 9.9 mg/dL (ref 8.7–10.3)
Chloride: 102 mmol/L (ref 96–106)
Creatinine, Ser: 0.73 mg/dL (ref 0.57–1.00)
Globulin, Total: 2.8 g/dL (ref 1.5–4.5)
Glucose: 105 mg/dL — ABNORMAL HIGH (ref 70–99)
Potassium: 4.3 mmol/L (ref 3.5–5.2)
Sodium: 142 mmol/L (ref 134–144)
Total Protein: 7 g/dL (ref 6.0–8.5)
eGFR: 85 mL/min/{1.73_m2} (ref >59)

## 2021-11-24 LAB — LIPID PANEL
Chol/HDL Ratio: 3.4 ratio (ref 0.0–4.4)
Cholesterol, Total: 161 mg/dL (ref 100–199)
HDL: 48 mg/dL (ref >39)
LDL Chol Calc (NIH): 54 mg/dL (ref 0–99)
Triglycerides: 392 mg/dL — ABNORMAL HIGH (ref 0–149)
VLDL Cholesterol Cal: 59 mg/dL — ABNORMAL HIGH (ref 5–40)

## 2021-11-24 LAB — VITAMIN D 25 HYDROXY (VIT D DEFICIENCY, FRACTURES): Vit D, 25-Hydroxy: 20.3 ng/mL — ABNORMAL LOW (ref 30.0–100.0)

## 2021-11-24 LAB — HEMOGLOBIN A1C
Est. average glucose Bld gHb Est-mCnc: 151 mg/dL
Hgb A1c MFr Bld: 6.9 % — ABNORMAL HIGH (ref 4.8–5.6)

## 2021-12-30 ENCOUNTER — Ambulatory Visit: Payer: Medicare Other | Admitting: Podiatry

## 2021-12-30 DIAGNOSIS — M79675 Pain in left toe(s): Secondary | ICD-10-CM | POA: Diagnosis not present

## 2021-12-30 DIAGNOSIS — M79674 Pain in right toe(s): Secondary | ICD-10-CM

## 2021-12-30 DIAGNOSIS — B351 Tinea unguium: Secondary | ICD-10-CM

## 2021-12-30 LAB — HM DIABETES FOOT EXAM

## 2021-12-30 NOTE — Progress Notes (Signed)
   Chief Complaint  Patient presents with   foot care    Patient is here for diabetic foot care.    SUBJECTIVE Patient with a history of diabetes mellitus presents to office today complaining of elongated, thickened nails that cause pain while ambulating in shoes.  Patient is unable to trim their own nails. Patient is here for further evaluation and treatment.   Past Medical History:  Diagnosis Date   Arthritis    Bronchitis    none currently   Chronic cough    Costochondritis    Diabetes mellitus without complication ([place])    8-9 years   Diverticulitis    Diverticulitis 08/2016   GERD (gastroesophageal reflux disease)    Hypercholesteremia    Hypertension    Hypomagnesemia 08/19/2014   Lactic acidosis 08/24/2016   Wears hearing aid    right ear    OBJECTIVE General Patient is awake, alert, and oriented x 3 and in no acute distress. Derm Skin is dry and supple bilateral. Negative open lesions or macerations. Remaining integument unremarkable. Nails are tender, long, thickened and dystrophic with subungual debris, consistent with onychomycosis, 1-5 bilateral. No signs of infection noted. Vasc  DP and PT pedal pulses palpable bilaterally. Temperature gradient within normal limits.  Neuro Epicritic and protective threshold sensation diminished bilaterally.  Musculoskeletal Exam Oligodactyly noted right foot.  Asymptomatic  ASSESSMENT 1. Diabetes Mellitus w/ peripheral neuropathy 2.  Pain due to onychomycosis of toenails bilateral  PLAN OF CARE 1. Patient evaluated today.   2. Instructed to maintain good pedal hygiene and foot care. Stressed importance of controlling blood sugar.  3. Mechanical debridement of nails 1-5 bilaterally performed using a nail nipper. Filed with dremel without incident.  4. Return to clinic in 3 mos.     Edrick Kins, DPM [facility]  Dr. Edrick Kins, DPM    [address], [place] [address]                Office [phone]  Fax [phone]

## 2022-02-06 ENCOUNTER — Other Ambulatory Visit: Payer: Self-pay | Admitting: Internal Medicine

## 2022-02-07 NOTE — Telephone Encounter (Signed)
Requested Prescriptions  Pending Prescriptions Disp Refills  . omeprazole (PRILOSEC) 40 MG capsule [Pharmacy Med Name: OMEPRAZOLE DR 40 MG CAPSULE] 90 capsule 0    Sig: TAKE 1 CAPSULE (40 MG TOTAL) BY MOUTH DAILY.     Gastroenterology: Proton Pump Inhibitors Passed - 02/06/2022  2:17 AM      Passed - Valid encounter within last 12 months    Recent Outpatient Visits          2 months ago Benign essential hypertension   [place] [facility] at [facility], Jesse Sans, MD   4 months ago Benign essential hypertension   [place] [facility] at [facility], Jesse Sans, MD   6 months ago Benign essential hypertension   [place] [facility] at [facility], Jesse Sans, MD   7 months ago Upper respiratory tract infection, unspecified type   [facility] at [facility], Jesse Sans, MD   10 months ago Benign essential hypertension    Primary Care and Sports Medicine at [facility], Jesse Sans, MD      Future Appointments            In 1 month [facility], Jesse Sans, MD [facility] Primary Care and [facility] Medicine at [facility]

## 2022-02-22 ENCOUNTER — Ambulatory Visit (INDEPENDENT_AMBULATORY_CARE_PROVIDER_SITE_OTHER): Payer: Medicare Other

## 2022-02-22 DIAGNOSIS — Z0000 Encounter for general adult medical examination without abnormal findings: Secondary | ICD-10-CM

## 2022-02-22 NOTE — Progress Notes (Signed)
Subjective:   Alison Warren is a 76 y.o. female who presents for Medicare Annual (Subsequent) preventive examination.  I connected with  Alison Warren on 02/22/22 by a audio enabled telemedicine application and verified that I am speaking with the correct person using two identifiers.  Patient Location: Home  Provider Location: Office/Clinic  I discussed the limitations of evaluation and management by telemedicine. The patient expressed understanding and agreed to proceed.   Review of Systems    Defer to PCP Cardiac Risk Factors include: advanced age (>66mn, >>57women)     Objective:    There were no vitals filed for this visit. There is no height or weight on file to calculate BMI.     02/22/2022    2:46 PM 09/13/2021   11:25 PM 03/21/2021    3:53 AM 02/21/2021    2:38 PM 03/13/2020   12:21 AM 12/14/2018   11:40 AM 08/21/2016    3:56 AM  Advanced Directives  Does Patient Have a Medical Advance Directive? No No No No No Yes No  Would patient like information on creating a medical advance directive? No - Patient declined No - Patient declined No - Patient declined No - Patient declined No - Patient declined  No - Patient declined    Current Medications (verified) Outpatient Encounter Medications as of 02/22/2022  Medication Sig   acetaminophen (TYLENOL) 500 MG tablet Take 500 mg by mouth every 6 (six) hours as needed for moderate pain, fever or headache.    albuterol (VENTOLIN HFA) 108 (90 Base) MCG/ACT inhaler Inhale 2 puffs into the lungs every 6 (six) hours as needed for wheezing or shortness of breath.   amLODipine (NORVASC) 5 MG tablet TAKE 1 TABLET (5 MG TOTAL) BY MOUTH DAILY.   aspirin 81 MG tablet Take 81 mg by mouth daily. AM   carvedilol (COREG) 3.125 MG tablet TAKE 1 TABLET BY MOUTH TWICE A DAY WITH A MEAL   ezetimibe (ZETIA) 10 MG tablet TAKE 1 TABLET BY MOUTH EVERY DAY   glucose blood (ONETOUCH ULTRA) test strip Use once daily   lidocaine (LIDODERM) 5 %  Place 1 patch onto the skin daily. Remove & Discard patch within 12 hours or as directed by MD   metFORMIN (GLUCOPHAGE-XR) 500 MG 24 hr tablet TAKE 2 TABLETS (1,000 MG TOTAL) BY MOUTH IN THE MORNING AND AT BEDTIME.   omeprazole (PRILOSEC) 40 MG capsule TAKE 1 CAPSULE (40 MG TOTAL) BY MOUTH DAILY.   ondansetron (ZOFRAN) 4 MG tablet Take 1 tablet (4 mg total) by mouth every 8 (eight) hours as needed for nausea or vomiting.   OneTouch Delica Lancets 328BMISC 1 each by Does not apply route See admin instructions.   No facility-administered encounter medications on file as of 02/22/2022.    Allergies (verified) Duloxetine, Tramadol, and Motrin [ibuprofen]   History: Past Medical History:  Diagnosis Date   Arthritis    Bronchitis    none currently   Chronic cough    Costochondritis    Diabetes mellitus without complication ([facility]    8-9 years   Diverticulitis    Diverticulitis 08/2016   GERD (gastroesophageal reflux disease)    Hypercholesteremia    Hypertension    Hypomagnesemia 08/19/2014   Lactic acidosis 08/24/2016   Wears hearing aid    right ear   Past Surgical History:  Procedure Laterality Date   CATARACT EXTRACTION W/PHACO Right 12/07/2014   Procedure: CATARACT EXTRACTION PHACO AND INTRAOCULAR LENS PLACEMENT [place]  Surgeon: Ronnell Freshwater, MD;  Location: [place];  Service: Ophthalmology;  Laterality: Right;  DIABETIC - oral meds   CHOLECYSTECTOMY  2007   COLONOSCOPY N/A 02/08/2016   Procedure: COLONOSCOPY;  Surgeon: Lollie Sails, MD;  Location: [facility] ENDOSCOPY;  Service: Endoscopy;  Laterality: N/A;  Diabetes   MASTOIDECTOMY     THUMB AMPUTATION     removal of 2nd thumb   TONSILLECTOMY     Family History  Problem Relation Age of Onset   Cancer Mother 35       Cervical   Heart attack Father    Diabetes Neg Hx    COPD Neg Hx    Social History   Socioeconomic History   Marital status: Widowed    Spouse name: Not on file   Number of  children: 2   Years of education: Not on file   Highest education level: Not on file  Occupational History   Occupation: retired  Tobacco Use   Smoking status: Never   Smokeless tobacco: Never  Vaping Use   Vaping Use: Never used  Substance and Sexual Activity   Alcohol use: No   Drug use: No   Sexual activity: Not Currently  Other Topics Concern   Not on file  Social History Narrative   Pt lives alone   Social Determinants of Health   Financial Resource Strain: Low Risk  (02/22/2022)   Overall Financial Resource Strain (CARDIA)    Difficulty of Paying Living Expenses: Not hard at all  Food Insecurity: No Food Insecurity (02/22/2022)   Hunger Vital Sign    Worried About Running Out of Food in the Last Year: Never true    [place] in the Last Year: Never true  Transportation Needs: No Transportation Needs (09/21/2021)   PRAPARE - Hydrologist (Medical): No    Lack of Transportation (Non-Medical): No  Physical Activity: Inactive (02/22/2022)   Exercise Vital Sign    Days of Exercise per Week: 0 days    Minutes of Exercise per Session: 0 min  Stress: No Stress Concern Present (02/22/2022)   [place]    Feeling of Stress : Not at all  Social Connections: Socially Isolated (02/22/2022)   Social Connection and Isolation Panel [NHANES]    Frequency of Communication with Friends and Family: More than three times a week    Frequency of Social Gatherings with Friends and Family: More than three times a week    Attends Religious Services: Never    Marine scientist or Organizations: No    Attends Archivist Meetings: Never    Marital Status: Widowed    Tobacco Counseling Counseling given: Not Answered   Clinical Intake:  Pre-visit preparation completed: Yes  Pain : 0-10 Pain Location: Leg Pain Orientation: Right, Left     Diabetes: Yes      Diabetic?YES         Activities of Daily Living    02/22/2022    2:47 PM 07/20/2021    1:11 PM  In your present state of health, do you have any difficulty performing the following activities:  Hearing? 1 1  Vision? 0 1  Difficulty concentrating or making decisions? 0 0  Walking or climbing stairs? 1 1  Dressing or bathing? 0 0  Doing errands, shopping? 0 0  Preparing Food and eating ? N   Using the Toilet? N  In the past six months, have you accidently leaked urine? N   Do you have problems with loss of bowel control? N   Managing your Medications? N   Managing your Finances? N   Housekeeping or managing your Housekeeping? N     Patient Care Team: Glean Hess, MD as PCP - General (Internal Medicine) Poggi, Marshall Cork, MD as Consulting Physician (Orthopedic Surgery) Ok Edwards, NP as Nurse Practitioner (Gastroenterology) Eulogio Bear, MD as Consulting Physician (Ophthalmology)  Indicate any recent Medical Services you may have received from other than [facility] providers in the past year (date may be approximate).     Assessment:   This is a routine wellness examination for Danay.  Hearing/Vision screen No results found.  Dietary issues and exercise activities discussed: Current Exercise Habits: The patient does not participate in regular exercise at present, Exercise limited by: orthopedic condition(s)   Goals Addressed   None   Depression Screen    02/22/2022    2:46 PM 11/23/2021    1:24 PM 09/21/2021    2:35 PM 07/20/2021    1:11 PM 07/05/2021    2:02 PM 03/21/2021    2:27 PM 02/21/2021    2:36 PM  PHQ 2/9 Scores  PHQ - 2 Score 0 0 0 0 0 0 0  PHQ- 9 Score 0 0 0 0 0 2 1    Fall Risk    02/22/2022    2:47 PM 11/23/2021    1:25 PM 09/21/2021    2:36 PM 07/20/2021    1:11 PM 07/05/2021    2:02 PM  Fall Risk   Falls in the past year? 0 0 0 0 0  Number falls in past yr: 0 0 0 0 0  Injury with Fall? 0 0 0 0 0  Risk for fall due to  : No Fall Risks No Fall Risks No Fall Risks No Fall Risks No Fall Risks  Follow up Falls evaluation completed Falls evaluation completed Falls evaluation completed Falls evaluation completed Falls evaluation completed    FALL RISK PREVENTION PERTAINING TO THE HOME:  Any stairs in or around the home? No  Home free of loose throw rugs in walkways, pet beds, electrical cords, etc? Yes  Adequate lighting in your home to reduce risk of falls? Yes   ASSISTIVE DEVICES UTILIZED TO PREVENT FALLS:  Life alert? No  Use of a cane, walker or w/c? Yes  Grab bars in the bathroom? Yes  Shower chair or bench in shower? Yes  Elevated toilet seat or a handicapped toilet? No   Cognitive Function:        02/22/2022    2:48 PM  6CIT Screen  What Year? 0 points  What month? 0 points  What time? 0 points  Count back from 20 0 points  Months in reverse 0 points  Repeat phrase 4 points  Total Score 4 points    Immunizations Immunization History  Administered Date(s) Administered   Fluad Quad(high Dose 65+) 12/20/2020   Influenza Inj Mdck Quad Pf 03/05/2019   Influenza Split 02/20/2014, 02/23/2015   Influenza, High Dose Seasonal PF 02/06/2017, 12/14/2017   Influenza-Unspecified 06/02/2003, 02/19/2013   Moderna Sars-Covid-2 Vaccination 08/18/2019, 09/15/2019   Pneumococcal Conjugate-13 02/24/2015   Pneumococcal Polysaccharide-23 01/09/2008, 09/15/2013    TDAP status: Due, Education has been provided regarding the importance of this vaccine. Advised may receive this vaccine at local pharmacy or Health Dept. Aware to provide a copy of the vaccination record if  obtained from local pharmacy or Health Dept. Verbalized acceptance and understanding.  Flu Vaccine status: Due, Education has been provided regarding the importance of this vaccine. Advised may receive this vaccine at local pharmacy or Health Dept. Aware to provide a copy of the vaccination record if obtained from local pharmacy or Health  Dept. Verbalized acceptance and understanding.  Covid-19 vaccine status: Completed vaccines  Qualifies for Shingles Vaccine? Yes   Zostavax completed No   Shingrix Completed?: No.    Education has been provided regarding the importance of this vaccine. Patient has been advised to call insurance company to determine out of pocket expense if they have not yet received this vaccine. Advised may also receive vaccine at local pharmacy or Health Dept. Verbalized acceptance and understanding.  Screening Tests Health Maintenance  Topic Date Due   TETANUS/TDAP  Never done   Zoster Vaccines- Shingrix (1 of 2) Never done   DEXA SCAN  Never done   COVID-19 Vaccine (3 - Moderna risk series) 10/13/2019   INFLUENZA VACCINE  11/08/2021   Diabetic kidney evaluation - Urine ACR  03/21/2022   FOOT EXAM  03/29/2022   HEMOGLOBIN A1C  05/26/2022   Diabetic kidney evaluation - GFR measurement  11/24/2022   Medicare Annual Wellness (AWV)  02/23/2023   Pneumonia Vaccine 73+ Years old  Completed   Hepatitis C Screening  Completed   HPV VACCINES  Aged Out   OPHTHALMOLOGY EXAM  Discontinued   COLONOSCOPY (Pts 45-10yr Insurance coverage will need to be confirmed)  Discontinued    Health Maintenance  Health Maintenance Due  Topic Date Due   TETANUS/TDAP  Never done   Zoster Vaccines- Shingrix (1 of 2) Never done   DEXA SCAN  Never done   COVID-19 Vaccine (3 - Moderna risk series) 10/13/2019   INFLUENZA VACCINE  11/08/2021   Diabetic kidney evaluation - Urine ACR  03/21/2022    Colorectal cancer screening: No longer required.   Mammogram status: No longer required due to age.  Lung Cancer Screening: (Low Dose CT Chest recommended if Age 77-80years, 30 pack-year currently smoking OR have quit w/in 15years.) does not qualify.   Additional Screening:  Hepatitis C Screening: does qualify; Completed 07/20/2021  Vision Screening: Recommended annual ophthalmology exams for early detection of glaucoma  and other disorders of the eye. Is the patient up to date with their annual eye exam?  Yes  Who is the provider or what is the name of the office in which the patient attends annual eye exams[facility] is not established with a provider, would they like to be referred to a provider to establish care?  N/A .   Dental Screening: Recommended annual dental exams for proper oral hygiene  Community Resource Referral / Chronic Care Management: CRR required this visit?  No   CCM required this visit?  No      Plan:     I have personally reviewed and noted the following in the patient's chart:   Medical and social history Use of alcohol, tobacco or illicit drugs  Current medications and supplements including opioid prescriptions. Patient is not currently taking opioid prescriptions. Functional ability and status Nutritional status Physical activity Advanced directives List of other physicians Hospitalizations, surgeries, and ER visits in previous 12 months Vitals Screenings to include cognitive, depression, and falls Referrals and appointments  In addition, I have reviewed and discussed with patient certain preventive protocols, quality metrics, and best practice recommendations. A written personalized care  plan for preventive services as well as general preventive health recommendations were provided to patient.     Clista Bernhardt, [place]   02/22/2022    Ms. Tomasita Crumble , Thank you for taking time to come for your Medicare Wellness Visit. I appreciate your ongoing commitment to your health goals. Please review the following plan we discussed and let me know if I can assist you in the future.   These are the goals we discussed:  Goals      DIET - INCREASE WATER INTAKE     Recommend drinking 6-8 glasses of water per day         This is a list of the screening recommended for you and due dates:  Health Maintenance  Topic Date Due   Tetanus Vaccine  Never done    Zoster (Shingles) Vaccine (1 of 2) Never done   DEXA scan (bone density measurement)  Never done   COVID-19 Vaccine (3 - Moderna risk series) 10/13/2019   Flu Shot  11/08/2021   Yearly kidney health urinalysis for diabetes  03/21/2022   Complete foot exam   03/29/2022   Hemoglobin A1C  05/26/2022   Yearly kidney function blood test for diabetes  11/24/2022   Medicare Annual Wellness Visit  02/23/2023   Pneumonia Vaccine  Completed   Hepatitis C Screening: USPSTF Recommendation to screen - Ages 18-79 yo.  Completed   HPV Vaccine  Aged Out   Eye exam for diabetics  Discontinued   Colon Cancer Screening  Discontinued     Nurse Notes: None.

## 2022-03-16 ENCOUNTER — Other Ambulatory Visit: Payer: Self-pay | Admitting: Internal Medicine

## 2022-03-16 DIAGNOSIS — E118 Type 2 diabetes mellitus with unspecified complications: Secondary | ICD-10-CM

## 2022-03-16 DIAGNOSIS — E1169 Type 2 diabetes mellitus with other specified complication: Secondary | ICD-10-CM

## 2022-03-16 DIAGNOSIS — I10 Essential (primary) hypertension: Secondary | ICD-10-CM

## 2022-03-18 ENCOUNTER — Encounter: Payer: Self-pay | Admitting: Emergency Medicine

## 2022-03-18 ENCOUNTER — Ambulatory Visit
Admission: EM | Admit: 2022-03-18 | Discharge: 2022-03-18 | Disposition: A | Discharge status: Home / Self Care | Payer: Medicare Other | Attending: Internal Medicine | Admitting: Internal Medicine

## 2022-03-18 ENCOUNTER — Ambulatory Visit (INDEPENDENT_AMBULATORY_CARE_PROVIDER_SITE_OTHER): Payer: Medicare Other

## 2022-03-18 DIAGNOSIS — Z79899 Other long term (current) drug therapy: Secondary | ICD-10-CM | POA: Insufficient documentation

## 2022-03-18 DIAGNOSIS — R6889 Other general symptoms and signs: Secondary | ICD-10-CM | POA: Diagnosis not present

## 2022-03-18 DIAGNOSIS — R059 Cough, unspecified: Secondary | ICD-10-CM | POA: Insufficient documentation

## 2022-03-18 DIAGNOSIS — J069 Acute upper respiratory infection, unspecified: Secondary | ICD-10-CM | POA: Diagnosis not present

## 2022-03-18 DIAGNOSIS — Z1152 Encounter for screening for COVID-19: Secondary | ICD-10-CM | POA: Insufficient documentation

## 2022-03-18 DIAGNOSIS — R0602 Shortness of breath: Secondary | ICD-10-CM | POA: Insufficient documentation

## 2022-03-18 DIAGNOSIS — J208 Acute bronchitis due to other specified organisms: Secondary | ICD-10-CM | POA: Diagnosis not present

## 2022-03-18 DIAGNOSIS — J029 Acute pharyngitis, unspecified: Secondary | ICD-10-CM | POA: Diagnosis not present

## 2022-03-18 LAB — RESP PANEL BY RT-PCR (FLU A&B, COVID) ARPGX2
Influenza A by PCR: NEGATIVE
Influenza B by PCR: NEGATIVE
SARS Coronavirus 2 by RT PCR: NEGATIVE

## 2022-03-18 MED ORDER — BENZONATATE 200 MG PO CAPS: 200.0000 mg | ORAL_CAPSULE | Freq: Three times a day (TID) | ORAL | 0 refills | Status: DC | PRN

## 2022-03-18 MED ORDER — ALBUTEROL SULFATE HFA 108 (90 BASE) MCG/ACT IN AERS: 2.0000 | INHALATION_SPRAY | RESPIRATORY_TRACT | 0 refills | Status: DC | PRN

## 2022-03-18 MED ORDER — ALBUTEROL SULFATE (2.5 MG/3ML) 0.083% IN NEBU
2.5000 mg | INHALATION_SOLUTION | Freq: Once | RESPIRATORY_TRACT | Status: AC
  Administered 2022-03-18: 2.5 mg via RESPIRATORY_TRACT

## 2022-03-18 NOTE — ED Triage Notes (Signed)
Pt c/o cough, body aches, subjective fever, sore throat, runny nose. Started yesterday.

## 2022-03-18 NOTE — ED Provider Notes (Addendum)
[facility]    CSN: [id number] Arrival date & time: 03/18/22  0809      History   Chief Complaint Chief Complaint  Patient presents with   Cough   Generalized Body Aches    HPI Alison Warren is a 76 y.o. female who presents with onset of subjective fever, body aches, ST, and rhinitis since yesterday. She has SOB, and been having coughing fits. She was up all night from the cough. Has not had her Flu shot, but had 2 Covid shots. Her cough is productive with clear mucous. Has been chilling, but did not take her temp. Her throat feels raw in the lower throat area.  Denies being around anyone sick, but she was at her PCP;s office this week and out and about. Her symptoms hit her quick.     Past Medical History:  Diagnosis Date   Arthritis    Bronchitis    none currently   Chronic cough    Costochondritis    Diabetes mellitus without complication ([place])    8-9 years   Diverticulitis    Diverticulitis 08/2016   GERD (gastroesophageal reflux disease)    Hypercholesteremia    Hypertension    Hypomagnesemia 08/19/2014   Lactic acidosis 08/24/2016   Wears hearing aid    right ear    Patient Active Problem List   Diagnosis Date Noted   Cirrhosis of liver without ascites, unspecified hepatic cirrhosis type ([place]) 07/05/2021   Benign essential hypertension 12/20/2020   Degenerative disc disease, cervical 03/23/2020   Chronic bilateral low back pain with bilateral sciatica 02/12/2020   Lumbar spondylosis with myelopathy 02/09/2020   Aortic atherosclerosis ([place]) 01/19/2020   GERD (gastroesophageal reflux disease) 11/21/2018   Hyperlipidemia associated with type 2 diabetes mellitus ([place]) 11/21/2018   Localized osteoarthritis of left knee 05/09/2018   B12 deficiency 04/30/2018   Obesity (BMI 30.0-34.9) 09/14/2017   Hepatic steatosis 02/26/2017   Diverticulitis of colon 09/22/2016   Vitamin D deficiency 09/01/2016   Type II diabetes mellitus with complication ([place])  61/60/7371   Hx of adenomatous colonic polyps 02/24/2016   NAFLD (nonalcoholic fatty liver disease) 12/16/2015   Osteopenia of multiple sites 09/09/2015   Seasonal allergic rhinitis due to pollen 08/24/2015   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/2015   Abnormal mammogram with microcalcification 07/02/2014   Muscle cramp, nocturnal 08/20/2013   Hearing loss of both ears 08/19/2013    Past Surgical History:  Procedure Laterality Date   CATARACT EXTRACTION W/PHACO Right 12/07/2014   Procedure: CATARACT EXTRACTION PHACO AND INTRAOCULAR LENS PLACEMENT ([place]);  Surgeon: Ronnell Freshwater, MD;  Location: [place];  Service: Ophthalmology;  Laterality: Right;  DIABETIC - oral meds   CHOLECYSTECTOMY  2007   COLONOSCOPY N/A 02/08/2016   Procedure: COLONOSCOPY;  Surgeon: Lollie Sails, MD;  [facility];  Service: Endoscopy;  Laterality: N/A;  Diabetes   MASTOIDECTOMY     THUMB AMPUTATION     removal of 2nd thumb   TONSILLECTOMY      OB History     Gravida  2   Para  2   Term      Preterm      AB      Living  2      SAB      IAB      Ectopic      Multiple      Live Births  Home Medications    Prior to Admission medications   Medication Sig Start Date End Date Taking? Authorizing Provider  acetaminophen (TYLENOL) 500 MG tablet Take 500 mg by mouth every 6 (six) hours as needed for moderate pain, fever or headache.    Yes [provider]  albuterol (VENTOLIN HFA) 108 (90 Base) MCG/ACT inhaler Inhale 2 puffs into the lungs every 4 (four) hours as needed for wheezing or shortness of breath. 03/18/22  Yes Rodriguez-Southworth, Sunday Spillers, PA-C  amLODipine (NORVASC) 5 MG tablet TAKE 1 TABLET (5 MG TOTAL) BY MOUTH DAILY. 03/16/22  Yes Glean Hess, MD  aspirin 81 MG tablet Take 81 mg by mouth daily. AM   Yes [provider]  benzonatate (TESSALON) 200 MG capsule Take 1 capsule (200 mg total) by mouth 3 (three)  times daily as needed for cough. 03/18/22  Yes Rodriguez-Southworth, Sunday Spillers, PA-C  carvedilol (COREG) 3.125 MG tablet TAKE 1 TABLET BY MOUTH TWICE A DAY WITH FOOD 03/16/22  Yes Glean Hess, MD  ezetimibe (ZETIA) 10 MG tablet TAKE 1 TABLET BY MOUTH EVERY DAY 03/16/22  Yes Glean Hess, MD  metFORMIN (GLUCOPHAGE-XR) 500 MG 24 hr tablet TAKE 2 TABLETS (1,000 MG TOTAL) BY MOUTH IN THE MORNING AND AT BEDTIME. 03/16/22  Yes Glean Hess, MD  omeprazole (PRILOSEC) 40 MG capsule TAKE 1 CAPSULE (40 MG TOTAL) BY MOUTH DAILY. 02/07/22  Yes Glean Hess, MD  Vitamin D, Cholecalciferol, 25 MCG (1000 UT) TABS Take by mouth.   Yes [provider]  glucose blood (ONETOUCH ULTRA) test strip Use once daily 11/23/21   Glean Hess, MD  lidocaine (LIDODERM) 5 % Place 1 patch onto the skin daily. Remove & Discard patch within 12 hours or as directed by MD 03/21/21   Paulette Blanch, MD  ondansetron (ZOFRAN) 4 MG tablet Take 1 tablet (4 mg total) by mouth every 8 (eight) hours as needed for nausea or vomiting. 02/04/21   Glean Hess, MD  OneTouch Delica Lancets 70J MISC 1 each by Does not apply route See admin instructions. 11/23/21   Glean Hess, MD    Family History Family History  Problem Relation Age of Onset   Cancer Mother 85       Cervical   Heart attack Father    Diabetes Neg Hx    COPD Neg Hx     Social History Social History   Tobacco Use   Smoking status: Never   Smokeless tobacco: Never  Vaping Use   Vaping Use: Never used  Substance Use Topics   Alcohol use: No   Drug use: No     Allergies   Duloxetine, Tramadol, and Motrin [ibuprofen]   Review of Systems Review of Systems  Constitutional:  Positive for appetite change, chills and fatigue. Negative for activity change.  HENT:  Positive for congestion, postnasal drip, rhinorrhea and sore throat. Negative for ear discharge, ear pain and trouble swallowing.   Eyes:  Positive for discharge.   Respiratory:  Positive for cough and shortness of breath. Negative for chest tightness.   Cardiovascular:  Negative for chest pain.  Musculoskeletal:  Positive for myalgias.  Neurological:  Positive for headaches.  Hematological:  Negative for adenopathy.     Physical Exam Triage Vital Signs ED Triage Vitals  Enc Vitals Group     BP 03/18/22 0828 (!) 165/94     Pulse Rate 03/18/22 0828 70     Resp 03/18/22 0828 18  Temp 03/18/22 0828 98 F (36.7 C)     Temp Source 03/18/22 0828 Oral     SpO2 03/18/22 0828 96 %     Weight 03/18/22 0825 160 lb 11.5 oz (72.9 kg)     Height 03/18/22 0825 '5\' 4"'$  (1.626 m)     Head Circumference --      Peak Flow --      Pain Score 03/18/22 0823 9     Pain Loc --      Pain Edu? --      Excl. in [place]? --    No data found.  Updated Vital Signs BP (!) 165/94 (BP Location: Left Arm) Comment: pt has not taken medications this morning.  Pulse 70   Temp 98 F (36.7 C) (Oral)   Resp 18   Ht '5\' 4"'$  (1.626 m)   Wt 160 lb 11.5 oz (72.9 kg)   SpO2 96%   BMI 27.59 kg/m   Visual Acuity Right Eye Distance:   Left Eye Distance:   Bilateral Distance:    Right Eye Near:   Left Eye Near:    Bilateral Near:      Physical Exam Vitals signs and nursing note reviewed.  Constitutional:      General: She is not in acute distress.    Appearance: Normal appearance. She is not ill-appearing, toxic-appearing or diaphoretic.  HEENT:  Eyes- both ware watering    Head: Normocephalic.     Right Ear: Tympanic membrane, ear canal and external ear normal.     Left Ear: Tympanic membrane, ear canal and external ear normal.     Nose: with clear rhinitis    Mouth/Throat: clear    Mouth: Mucous membranes are moist.  Eyes:     General: No scleral icterus.       Right eye: No discharge.        Left eye: No discharge.     Conjunctiva/sclera: Conjunctivae normal.  Neck:     Musculoskeletal: Neck supple. No neck rigidity.  Cardiovascular:     Rate and Rhythm:  Normal rate and regular rhythm.     Heart sounds: No murmur.  Pulmonary:     Effort: Pulmonary effort is normal, but forced expirations provoke her cough.     Breath sounds: has mild wheezing throughout lungs , but resolved after the albuterol inhaler  .  Musculoskeletal: Normal range of motion.  Lymphadenopathy:     Cervical: No cervical adenopathy.  Skin:    General: Skin is warm and dry.     Coloration: Skin is not jaundiced.     Findings: No rash.  Neurological:     Mental Status: She is alert and oriented to person, place, and time.     Gait: Gait normal.  Psychiatric:        Mood and Affect: Mood normal.        Behavior: Behavior normal.        Thought Content: Thought content normal.        Judgment: Judgment normal.    UC Treatments / Results  Labs (all labs ordered are listed, but only abnormal results are displayed) Labs Reviewed  RESP PANEL BY RT-PCR (FLU A&B, COVID) [id number]  Flu and covid test area negative  EKG   Radiology DG Chest 2 View  Result Date: 03/18/2022 CLINICAL DATA:  Coughing fits Shortness of breath Sore throat Runny nose EXAM: CHEST - 2 VIEW COMPARISON:  07/03/2021 FINDINGS: Cardiomediastinal silhouette and pulmonary vasculature are within normal  limits. Lungs are clear.  Unchanged elevation of right hemidiaphragm. IMPRESSION: No acute cardiopulmonary process Electronically Signed   By: Miachel Roux M.D.   On: 03/18/2022 09:26    Procedures Procedures (including critical care time)  Medications Ordered in UC Medications  albuterol (PROVENTIL) (2.5 MG/3ML) 0.083% nebulizer solution 2.5 mg (2.5 mg Nebulization Given 03/18/22 0906)    Initial Impression / Assessment and Plan / UC Course  I have reviewed the triage vital signs and the nursing notes. She was given Albuterol neb and the wheezing resolved.  Pertinent labs & imaging results that were available during my care of the patient were reviewed by me and considered in my medical decision  making (see chart for details). She was given Albuterol neb and she felt much better after this treatment and during reexamination she did not have any coughing with deep breaths.  URI with cough Viral bronchitis with bronchospasm  I placed her on Abuterol inhaler and Tessalon as noted Pt informed if she gets worse in the next 3 days, to come back.     Final Clinical Impressions(s) / UC Diagnoses  URI with cough Viral bronchitis with bronchospasm Final diagnoses:  Flu-like symptoms  Viral URI with cough     Discharge Instructions      Your covid and flu tests are negative The chest xray is normal, no signs of pneumonia  You have a cold and mild viral bronchitis  Use the albuterol inhaler every 4-6 hours for cough and wheezing fo 5 days, then after that only when you need it. I have sent cough pills to help during the day and night tme. If you develop a fever or get worse in the next week, please come back.      ED Prescriptions     Medication Sig Dispense Auth. Provider   albuterol (VENTOLIN HFA) 108 (90 Base) MCG/ACT inhaler Inhale 2 puffs into the lungs every 4 (four) hours as needed for wheezing or shortness of breath. 18 g Rodriguez-Southworth, Elieser Tetrick, PA-C   benzonatate (TESSALON) 200 MG capsule Take 1 capsule (200 mg total) by mouth 3 (three) times daily as needed for cough. 30 capsule Rodriguez-Southworth, Sunday Spillers, PA-C      PDMP not reviewed this encounter.   Shelby Mattocks, PA-C 03/18/22 0933    Rodriguez-Southworth, Sunday Spillers, PA-C 03/18/22 0933    Rodriguez-Southworth, Sunday Spillers, PA-C 03/18/22 0933    Rodriguez-Southworth, Sunday Spillers, PA-C 03/18/22 [phone]

## 2022-03-18 NOTE — Discharge Instructions (Addendum)
Your covid and flu tests are negative The chest xray is normal, no signs of pneumonia  You have a cold and mild viral bronchitis  Use the albuterol inhaler every 4-6 hours for cough and wheezing fo 5 days, then after that only when you need it. I have sent cough pills to help during the day and night tme. If you develop a fever or get worse in the next week, please come back.

## 2022-03-24 ENCOUNTER — Ambulatory Visit (INDEPENDENT_AMBULATORY_CARE_PROVIDER_SITE_OTHER): Payer: Medicare Other | Admitting: Internal Medicine

## 2022-03-24 ENCOUNTER — Encounter: Payer: Self-pay | Admitting: Internal Medicine

## 2022-03-24 VITALS — BP 112/62 | HR 77 | Ht 64.0 in | Wt 159.0 lb

## 2022-03-24 DIAGNOSIS — E118 Type 2 diabetes mellitus with unspecified complications: Secondary | ICD-10-CM

## 2022-03-24 DIAGNOSIS — E1169 Type 2 diabetes mellitus with other specified complication: Secondary | ICD-10-CM

## 2022-03-24 DIAGNOSIS — J069 Acute upper respiratory infection, unspecified: Secondary | ICD-10-CM | POA: Diagnosis not present

## 2022-03-24 DIAGNOSIS — I10 Essential (primary) hypertension: Secondary | ICD-10-CM | POA: Diagnosis not present

## 2022-03-24 DIAGNOSIS — E785 Hyperlipidemia, unspecified: Secondary | ICD-10-CM

## 2022-03-24 DIAGNOSIS — E559 Vitamin D deficiency, unspecified: Secondary | ICD-10-CM | POA: Diagnosis not present

## 2022-03-24 NOTE — Progress Notes (Signed)
Date:  03/24/2022   Name:  Alison Warren   DOB:  26-Aug-1945   MRN:  [id number]   Chief Complaint: Hypertension and Diabetes  URI  This is a new problem. The current episode started in the past 7 days (UC tested neg for Covid and Flu). The problem has been gradually improving. There has been no fever. Associated symptoms include congestion and coughing. Pertinent negatives include no chest pain, diarrhea, headaches, nausea, vomiting or wheezing. Treatments tried: tessalon and albuterol. The treatment provided significant relief.  Diabetes She presents for her follow-up diabetic visit. She has type 2 diabetes mellitus. Pertinent negatives for hypoglycemia include no headaches or nervousness/anxiousness. Pertinent negatives for diabetes include no chest pain and no fatigue. Symptoms are stable. Current diabetic treatment includes oral agent (monotherapy) (metformin). She is compliant with treatment all of the time.  Hypertension This is a chronic problem. The problem is controlled. Pertinent negatives include no chest pain, headaches, palpitations or shortness of breath. Past treatments include calcium channel blockers and beta blockers.    Lab Results  Component Value Date   NA 142 11/23/2021   K 4.3 11/23/2021   CO2 19 (L) 11/23/2021   GLUCOSE 105 (H) 11/23/2021   BUN 8 11/23/2021   CREATININE 0.73 11/23/2021   CALCIUM 9.9 11/23/2021   EGFR 85 11/23/2021   GFRNONAA >60 07/03/2021   Lab Results  Component Value Date   CHOL 161 11/23/2021   HDL 48 11/23/2021   LDLCALC 54 11/23/2021   TRIG 392 (H) 11/23/2021   CHOLHDL 3.4 11/23/2021   No results found for: "TSH" Lab Results  Component Value Date   HGBA1C 6.9 (H) 11/23/2021   Lab Results  Component Value Date   WBC 7.7 11/23/2021   HGB 13.9 11/23/2021   HCT 42.9 11/23/2021   MCV 87 11/23/2021   PLT 226 11/23/2021   Lab Results  Component Value Date   ALT 33 (H) 11/23/2021   AST 39 11/23/2021   ALKPHOS 97  11/23/2021   BILITOT 0.3 11/23/2021   Lab Results  Component Value Date   VD25OH 20.3 (L) 11/23/2021     Review of Systems  Constitutional:  Negative for chills, fatigue and fever.  HENT:  Positive for congestion and hearing loss. Negative for trouble swallowing.   Respiratory:  Positive for cough. Negative for chest tightness, shortness of breath and wheezing.   Cardiovascular:  Negative for chest pain and palpitations.  Gastrointestinal:  Negative for diarrhea, nausea and vomiting.  Musculoskeletal:  Positive for arthralgias and gait problem.  Neurological:  Negative for headaches.  Psychiatric/Behavioral:  Negative for dysphoric mood and sleep disturbance. The patient is not nervous/anxious.     Patient Active Problem List   Diagnosis Date Noted   Cirrhosis of liver without ascites, unspecified hepatic cirrhosis type ([place]) 07/05/2021   Benign essential hypertension 12/20/2020   Degenerative disc disease, cervical 03/23/2020   Chronic bilateral low back pain with bilateral sciatica 02/12/2020   Lumbar spondylosis with myelopathy 02/09/2020   Aortic atherosclerosis ([place]) 01/19/2020   GERD (gastroesophageal reflux disease) 11/21/2018   Hyperlipidemia associated with type 2 diabetes mellitus ([place]) 11/21/2018   Localized osteoarthritis of left knee 05/09/2018   B12 deficiency 04/30/2018   Obesity (BMI 30.0-34.9) 09/14/2017   Hepatic steatosis 02/26/2017   Diverticulitis of colon 09/22/2016   Vitamin D deficiency 09/01/2016   Type II diabetes mellitus with complication ([place]) 47/42/5956   Hx of adenomatous colonic polyps 02/24/2016   NAFLD (nonalcoholic fatty liver  disease) 12/16/2015   Osteopenia of multiple sites 09/09/2015   Seasonal allergic rhinitis due to pollen 08/24/2015   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/2015   Abnormal mammogram with microcalcification 07/02/2014   Muscle cramp, nocturnal 08/20/2013   Hearing loss of both ears 08/19/2013    Allergies  Allergen  Reactions   Duloxetine Nausea And Vomiting   Tramadol Nausea And Vomiting   Motrin [Ibuprofen] Rash    Past Surgical History:  Procedure Laterality Date   CATARACT EXTRACTION W/PHACO Right 12/07/2014   Procedure: CATARACT EXTRACTION PHACO AND INTRAOCULAR LENS PLACEMENT ([place]);  Surgeon: Ronnell Freshwater, MD;  Location: [place];  Service: Ophthalmology;  Laterality: Right;  DIABETIC - oral meds   CHOLECYSTECTOMY  2007   COLONOSCOPY N/A 02/08/2016   Procedure: COLONOSCOPY;  Surgeon: Lollie Sails, MD;  Location: [facility] ENDOSCOPY;  Service: Endoscopy;  Laterality: N/A;  Diabetes   MASTOIDECTOMY     THUMB AMPUTATION     removal of 2nd thumb   TONSILLECTOMY      Social History   Tobacco Use   Smoking status: Never   Smokeless tobacco: Never  Vaping Use   Vaping Use: Never used  Substance Use Topics   Alcohol use: No   Drug use: No     Medication list has been reviewed and updated.  Current Meds  Medication Sig   acetaminophen (TYLENOL) 500 MG tablet Take 500 mg by mouth every 6 (six) hours as needed for moderate pain, fever or headache.    albuterol (VENTOLIN HFA) 108 (90 Base) MCG/ACT inhaler Inhale 2 puffs into the lungs every 4 (four) hours as needed for wheezing or shortness of breath.   amLODipine (NORVASC) 5 MG tablet TAKE 1 TABLET (5 MG TOTAL) BY MOUTH DAILY.   aspirin 81 MG tablet Take 81 mg by mouth daily. AM   benzonatate (TESSALON) 200 MG capsule Take 1 capsule (200 mg total) by mouth 3 (three) times daily as needed for cough.   carvedilol (COREG) 3.125 MG tablet TAKE 1 TABLET BY MOUTH TWICE A DAY WITH FOOD   ezetimibe (ZETIA) 10 MG tablet TAKE 1 TABLET BY MOUTH EVERY DAY   glucose blood (ONETOUCH ULTRA) test strip Use once daily   lidocaine (LIDODERM) 5 % Place 1 patch onto the skin daily. Remove & Discard patch within 12 hours or as directed by MD   metFORMIN (GLUCOPHAGE-XR) 500 MG 24 hr tablet TAKE 2 TABLETS (1,000 MG TOTAL) BY MOUTH IN THE  MORNING AND AT BEDTIME.   omeprazole (PRILOSEC) 40 MG capsule TAKE 1 CAPSULE (40 MG TOTAL) BY MOUTH DAILY.   ondansetron (ZOFRAN) 4 MG tablet Take 1 tablet (4 mg total) by mouth every 8 (eight) hours as needed for nausea or vomiting.   OneTouch Delica Lancets 09F MISC 1 each by Does not apply route See admin instructions.   Vitamin D, Cholecalciferol, 25 MCG (1000 UT) TABS Take by mouth.       03/24/2022    1:29 PM 11/23/2021    1:25 PM 09/21/2021    2:35 PM 07/20/2021    1:11 PM  GAD 7 : Generalized Anxiety Score  Nervous, Anxious, on Edge 0 0 0 0  Control/stop worrying 0 0 0 0  Worry too much - different things 0 0 0 0  Trouble relaxing 0 0 0 0  Restless 0 0 0 0  Easily annoyed or irritable 0 0 0 0  Afraid - awful might happen 0 0 0 0  Total GAD  7 Score 0 0 0 0  Anxiety Difficulty Not difficult at all Not difficult at all Not difficult at all Not difficult at all       03/24/2022    1:29 PM 02/22/2022    2:46 PM 11/23/2021    1:24 PM  Depression screen PHQ 2/9  Decreased Interest 0 0 0  Down, Depressed, Hopeless 0 0 0  PHQ - 2 Score 0 0 0  Altered sleeping 0 0 0  Tired, decreased energy 0 0 0  Change in appetite 0 0 0  Feeling bad or failure about yourself  0 0 0  Trouble concentrating 0 0 0  Moving slowly or fidgety/restless 0 0 0  Suicidal thoughts 0 0 0  PHQ-9 Score 0 0 0  Difficult doing work/chores Not difficult at all Not difficult at all Not difficult at all    BP Readings from Last 3 Encounters:  03/24/22 112/62  03/18/22 (!) 165/94  11/23/21 128/74    Physical Exam Vitals and nursing note reviewed.  Constitutional:      General: She is not in acute distress.    Appearance: Normal appearance. She is well-developed.  HENT:     Head: Normocephalic and atraumatic.     Right Ear: Decreased hearing noted.     Left Ear: Decreased hearing noted.  Cardiovascular:     Rate and Rhythm: Normal rate and regular rhythm.     Pulses: Normal pulses.  Pulmonary:      Effort: Pulmonary effort is normal. No respiratory distress.     Breath sounds: No wheezing or rhonchi.     Comments: Occasional dry cough Musculoskeletal:     Cervical back: Normal range of motion.     Right lower leg: No edema.     Left lower leg: No edema.  Lymphadenopathy:     Cervical: No cervical adenopathy.  Skin:    General: Skin is warm and dry.     Capillary Refill: Capillary refill takes less than 2 seconds.     Findings: No rash.  Neurological:     General: No focal deficit present.     Mental Status: She is alert and oriented to person, place, and time.  Psychiatric:        Mood and Affect: Mood normal.        Behavior: Behavior normal.     Wt Readings from Last 3 Encounters:  03/24/22 159 lb (72.1 kg)  03/18/22 160 lb 11.5 oz (72.9 kg)  11/23/21 160 lb 12.8 oz (72.9 kg)    BP 112/62 (BP Location: Right Arm, Cuff Size: Normal)   Pulse 77   Ht 5' 4" (1.626 m)   Wt 159 lb (72.1 kg)   SpO2 95%   BMI 27.29 kg/m   Assessment and Plan: 1. Type II diabetes mellitus with complication (HCC) Clinically stable by exam and report without s/s of hypoglycemia. DM complicated by hypertension and dyslipidemia. Tolerating medications well without side effects or other concerns. - Basic metabolic panel - Hemoglobin A1c - Microalbumin / creatinine urine ratio  2. Viral URI with cough Improving with supportive care Continue Tessalon and albuterol. Follow up if symptoms worsen  3. Benign essential hypertension Clinically stable exam with well controlled BP. Tolerating medications without side effects at this time. Pt to continue current regimen and low sodium diet; benefits of regular exercise as able discussed.  4. Vitamin D deficiency Now on daily supplement Will recheck levels - VITAMIN D 25 Hydroxy (Vit-D Deficiency, Fractures)  5. Hyperlipidemia associated with type 2 diabetes mellitus ([place]) On Zetia therapy.   Partially dictated using Editor, commissioning.  Any errors are unintentional.  Halina Maidens, MD [facility]  03/24/2022

## 2022-03-25 LAB — BASIC METABOLIC PANEL
BUN/Creatinine Ratio: 13 (ref 12–28)
BUN: 11 mg/dL (ref 8–27)
CO2: 22 mmol/L (ref 20–29)
Calcium: 10.4 mg/dL — ABNORMAL HIGH (ref 8.7–10.3)
Chloride: 100 mmol/L (ref 96–106)
Creatinine, Ser: 0.84 mg/dL (ref 0.57–1.00)
Glucose: 118 mg/dL — ABNORMAL HIGH (ref 70–99)
Potassium: 4.4 mmol/L (ref 3.5–5.2)
Sodium: 140 mmol/L (ref 134–144)
eGFR: 72 mL/min/{1.73_m2} (ref >59)

## 2022-03-25 LAB — HEMOGLOBIN A1C
Est. average glucose Bld gHb Est-mCnc: 151 mg/dL
Hgb A1c MFr Bld: 6.9 % — ABNORMAL HIGH (ref 4.8–5.6)

## 2022-03-25 LAB — VITAMIN D 25 HYDROXY (VIT D DEFICIENCY, FRACTURES): Vit D, 25-Hydroxy: 22.8 ng/mL — ABNORMAL LOW (ref 30.0–100.0)

## 2022-03-26 LAB — MICROALBUMIN / CREATININE URINE RATIO
Creatinine, Urine: 157.2 mg/dL
Microalb/Creat Ratio: 9 mg/g creat (ref 0–29)
Microalbumin, Urine: 13.4 ug/mL

## 2022-03-27 ENCOUNTER — Telehealth: Payer: Self-pay

## 2022-03-27 ENCOUNTER — Other Ambulatory Visit: Payer: Self-pay | Admitting: Internal Medicine

## 2022-03-27 DIAGNOSIS — E559 Vitamin D deficiency, unspecified: Secondary | ICD-10-CM

## 2022-03-27 MED ORDER — VITAMIN D (ERGOCALCIFEROL) 1.25 MG (50000 UNIT) PO CAPS: 50000.0000 [IU] | ORAL_CAPSULE | ORAL | 3 refills | Status: DC

## 2022-03-27 NOTE — Telephone Encounter (Signed)
Pt given lab results per notes of Dr. Army Melia on 03/27/22. Pt verbalized understanding. Advised to stop the OTC Vitamin D and to take the prescription strength after she asked should she take 2 of the OTC.    Alison Hess, MD 03/27/2022  8:57 AM EST     Vitamin D is still low.  I will send in an prescription strength to take once a week.  DM is good and other labs are normal.

## 2022-03-31 ENCOUNTER — Ambulatory Visit: Payer: Medicare Other | Admitting: Podiatry

## 2022-05-06 ENCOUNTER — Other Ambulatory Visit: Payer: Self-pay | Admitting: Internal Medicine

## 2022-05-08 NOTE — Telephone Encounter (Signed)
Requested Prescriptions  Pending Prescriptions Disp Refills   omeprazole (PRILOSEC) 40 MG capsule [Pharmacy Med Name: OMEPRAZOLE DR 40 MG CAPSULE] 90 capsule 0    Sig: TAKE 1 CAPSULE (40 MG TOTAL) BY MOUTH DAILY.     Gastroenterology: Proton Pump Inhibitors Passed - 05/06/2022  1:02 AM      Passed - Valid encounter within last 12 months    Recent Outpatient Visits           1 month ago Type II diabetes mellitus with complication [facility])   [place] [facility] at [facility], Jesse Sans, MD   5 months ago Benign essential hypertension   [place] [facility] at [facility], Jesse Sans, MD   7 months ago Benign essential hypertension   [place] [facility] at [facility], Jesse Sans, MD   9 months ago Benign essential hypertension    Primary Care & Sports Medicine at [facility], Jesse Sans, MD   10 months ago Upper respiratory tract infection, unspecified type   [place] [facility] at [facility], Jesse Sans, MD       Future Appointments             In 2 months Army Melia, Jesse Sans, MD [place] at [facility], [facility]

## 2022-07-07 ENCOUNTER — Ambulatory Visit
Admission: EM | Admit: 2022-07-07 | Discharge: 2022-07-07 | Disposition: A | Discharge status: Home / Self Care | Payer: Medicare Other | Attending: Emergency Medicine | Admitting: Emergency Medicine

## 2022-07-07 DIAGNOSIS — H5789 Other specified disorders of eye and adnexa: Secondary | ICD-10-CM | POA: Diagnosis not present

## 2022-07-07 MED ORDER — POLYMYXIN B-TRIMETHOPRIM 10000-0.1 UNIT/ML-% OP SOLN: 1.0000 [drp] | Freq: Three times a day (TID) | OPHTHALMIC | 0 refills | Status: DC

## 2022-07-07 NOTE — ED Triage Notes (Signed)
Pt is hard of hearing   Pt states that her right eye is hurting and she is unsure if something is in her eye. She states that it feels like a needle is scratching her eye. x1day  Pt is diabetic and has had laser surgery on her eye.   Pt states that she sees Dr. Army Melia upstairs.   Pt does not wear glasses and states that she can not wear any after her laser surgery.   Pt states that her vision is blurry and she can not see.

## 2022-07-07 NOTE — Discharge Instructions (Signed)
Today you being treated for bacterial conjunctivitis  Your eyes have been rinsed in the office to help with sensation of foreign object  Place one drop of polytrim into the effected eye ( 3 times a day) every 8 hours while awake for 7 days.   May use cool compress for comfort and to remove discharge if present. Pat the eye, do not wipe.  Do not rub eyes, this may cause more irritation.  If symptoms persist after use of medication, please follow up at Urgent Care or with ophthalmologist (eye doctor)

## 2022-07-07 NOTE — ED Provider Notes (Signed)
[facility]    CSN: [id number] Arrival date & time: 07/07/22  1330      History   Chief Complaint Chief Complaint  Patient presents with   Eye Problem    HPI Alison Warren is a 77 y.o. female.   Patient presents for evaluation of right eye irritation beginning 1 day ago.  Feels as if there is a sharp object sticking into the eye.  Increased watery causing vision become blurry.  Denies pruritus, puslike drainage or light sensitivity.  Denies injury or trauma.  No known sick contacts.  Denies use of contacts.  History of cataracts.  Past Medical History:  Diagnosis Date   Arthritis    Bronchitis    none currently   Chronic cough    Costochondritis    Diabetes mellitus without complication ([place])    8-9 years   Diverticulitis    Diverticulitis 08/2016   GERD (gastroesophageal reflux disease)    Hypercholesteremia    Hypertension    Hypomagnesemia 08/19/2014   Lactic acidosis 08/24/2016   Wears hearing aid    right ear    Patient Active Problem List   Diagnosis Date Noted   Cirrhosis of liver without ascites, unspecified hepatic cirrhosis type ([place]) 07/05/2021   Benign essential hypertension 12/20/2020   Degenerative disc disease, cervical 03/23/2020   Chronic bilateral low back pain with bilateral sciatica 02/12/2020   Lumbar spondylosis with myelopathy 02/09/2020   Aortic atherosclerosis ([place]) 01/19/2020   GERD (gastroesophageal reflux disease) 11/21/2018   Hyperlipidemia associated with type 2 diabetes mellitus ([place]) 11/21/2018   Localized osteoarthritis of left knee 05/09/2018   B12 deficiency 04/30/2018   Obesity (BMI 30.0-34.9) 09/14/2017   Hepatic steatosis 02/26/2017   Diverticulitis of colon 09/22/2016   Vitamin D deficiency 09/01/2016   Type II diabetes mellitus with complication ([place]) [id number]   Hx of adenomatous colonic polyps 02/24/2016   NAFLD (nonalcoholic fatty liver disease) 12/16/2015   Osteopenia of multiple sites 09/09/2015    Seasonal allergic rhinitis due to pollen 08/24/2015   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/2015   Abnormal mammogram with microcalcification 07/02/2014   Muscle cramp, nocturnal 08/20/2013   Hearing loss of both ears 08/19/2013    Past Surgical History:  Procedure Laterality Date   CATARACT EXTRACTION W/PHACO Right 12/07/2014   Procedure: CATARACT EXTRACTION PHACO AND INTRAOCULAR LENS PLACEMENT ([place]);  Surgeon: Ronnell Freshwater, MD;  Location: [place];  Service: Ophthalmology;  Laterality: Right;  DIABETIC - oral meds   CHOLECYSTECTOMY  2007   COLONOSCOPY N/A 02/08/2016   Procedure: COLONOSCOPY;  Surgeon: Lollie Sails, MD;  Location[facility];  Service: Endoscopy;  Laterality: N/A;  Diabetes   MASTOIDECTOMY     THUMB AMPUTATION     removal of 2nd thumb   TONSILLECTOMY      OB History     Gravida  2   Para  2   Term      Preterm      AB      Living  2      SAB      IAB      Ectopic      Multiple      Live Births               Home Medications    Prior to Admission medications   Medication Sig Start Date End Date Taking? Authorizing Provider  albuterol (VENTOLIN HFA) 108 (90 Base) MCG/ACT inhaler Inhale 2 puffs into the  lungs every 4 (four) hours as needed for wheezing or shortness of breath. 03/18/22  Yes Rodriguez-Southworth, Sunday Spillers, PA-C  amLODipine (NORVASC) 5 MG tablet TAKE 1 TABLET (5 MG TOTAL) BY MOUTH DAILY. 03/16/22  Yes Glean Hess, MD  aspirin 81 MG tablet Take 81 mg by mouth daily. AM   Yes [provider]  carvedilol (COREG) 3.125 MG tablet TAKE 1 TABLET BY MOUTH TWICE A DAY WITH FOOD 03/16/22  Yes Glean Hess, MD  ezetimibe (ZETIA) 10 MG tablet TAKE 1 TABLET BY MOUTH EVERY DAY 03/16/22  Yes Glean Hess, MD  metFORMIN (GLUCOPHAGE-XR) 500 MG 24 hr tablet TAKE 2 TABLETS (1,000 MG TOTAL) BY MOUTH IN THE MORNING AND AT BEDTIME. 03/16/22  Yes Glean Hess, MD  ondansetron (ZOFRAN) 4 MG tablet  Take 1 tablet (4 mg total) by mouth every 8 (eight) hours as needed for nausea or vomiting. 02/04/21  Yes Glean Hess, MD  trimethoprim-polymyxin b Mayra Neer) ophthalmic solution Place 1 drop into the right eye every 8 (eight) hours. 07/07/22  Yes Alize Acy R, NP  Vitamin D, Ergocalciferol, (DRISDOL) 1.25 MG (50000 UNIT) CAPS capsule Take 1 capsule (50,000 Units total) by mouth every 7 (seven) days. 03/27/22   Glean Hess, MD  acetaminophen (TYLENOL) 500 MG tablet Take 500 mg by mouth every 6 (six) hours as needed for moderate pain, fever or headache.     [provider]  benzonatate (TESSALON) 200 MG capsule Take 1 capsule (200 mg total) by mouth 3 (three) times daily as needed for cough. 03/18/22   Rodriguez-Southworth, Sunday Spillers, PA-C  glucose blood (ONETOUCH ULTRA) test strip Use once daily 11/23/21   Glean Hess, MD  lidocaine (LIDODERM) 5 % Place 1 patch onto the skin daily. Remove & Discard patch within 12 hours or as directed by MD 03/21/21   Paulette Blanch, MD  omeprazole (PRILOSEC) 40 MG capsule TAKE 1 CAPSULE (40 MG TOTAL) BY MOUTH DAILY. 05/08/22   Glean Hess, MD  OneTouch Delica Lancets [id number] MISC 1 each by Does not apply route See admin instructions. 11/23/21   Glean Hess, MD  Vitamin D, Cholecalciferol, 25 MCG (1000 UT) TABS Take by mouth.    [provider]    Family History Family History  Problem Relation Age of Onset   Cancer Mother 74       Cervical   Heart attack Father    Diabetes Neg Hx    COPD Neg Hx     Social History Social History   Tobacco Use   Smoking status: Never   Smokeless tobacco: Never  Vaping Use   Vaping Use: Never used  Substance Use Topics   Alcohol use: No   Drug use: No     Allergies   Duloxetine, Tramadol, and Motrin [ibuprofen]   Review of Systems Review of Systems   Physical Exam Triage Vital Signs ED Triage Vitals  Enc Vitals Group     BP 07/07/22 1354 118/80     Pulse Rate  07/07/22 1354 77     Resp --      Temp 07/07/22 1354 98.2 F (36.8 C)     Temp Source 07/07/22 1354 Oral     SpO2 07/07/22 1354 97 %     Weight 07/07/22 1351 150 lb (68 kg)     Height 07/07/22 1351 5\' 4"  (1.626 m)     Head Circumference --      Peak Flow --  Pain Score 07/07/22 1352 10     Pain Loc --      Pain Edu? --      Excl. in [place]? --    No data found.  Updated Vital Signs BP 118/80 (BP Location: Left Arm)   Pulse 77   Temp 98.2 F (36.8 C) (Oral)   Ht 5\' 4"  (1.626 m)   Wt 150 lb (68 kg)   SpO2 97%   BMI 25.75 kg/m   Visual Acuity Right Eye Distance:   Left Eye Distance:   Bilateral Distance:    Right Eye Near:   Left Eye Near:    Bilateral Near:     Physical Exam Constitutional:      Appearance: Normal appearance.  Eyes:     Comments: Erythema is present to the right conjunctiva and sclera, no presence of foreign body noted, vision is grossly intact, extraocular movements are intact  Pulmonary:     Effort: Pulmonary effort is normal.  Neurological:     Mental Status: She is alert and oriented to person, place, and time. Mental status is at baseline.      UC Treatments / Results  Labs (all labs ordered are listed, but only abnormal results are displayed) Labs Reviewed - No data to display  EKG   Radiology No results found.  Procedures Procedures (including critical care time)  Medications Ordered in UC Medications - No data to display  Initial Impression / Assessment and Plan / UC Course  I have reviewed the triage vital signs and the nursing notes.  Pertinent labs & imaging results that were available during my care of the patient were reviewed by me and considered in my medical decision making (see chart for details).  Irritation right eye   Eye washing completed was approximately 30 to 40 cc of fluid, patient endorses some improvement but symptom has not resolved, most likely a corneal abrasion as she does endorse the eye rubbing  after symptoms first began, prescribed Polytrim prophylactically, advised against eye touching or rubbing, may pat eye with cool cloth for her nephew, may help cool compresses or warm compresses to the eye for comfort and advise follow-up with her ophthalmologist as symptoms persist Final Clinical Impressions(s) / UC Diagnoses   Final diagnoses:  Irritation of right eye     Discharge Instructions      Today you being treated for bacterial conjunctivitis  Your eyes have been rinsed in the office to help with sensation of foreign object  Place one drop of polytrim into the effected eye ( 3 times a day) every 8 hours while awake for 7 days.   May use cool compress for comfort and to remove discharge if present. Pat the eye, do not wipe.  Do not rub eyes, this may cause more irritation.  If symptoms persist after use of medication, please follow up at Urgent Care or with ophthalmologist (eye doctor)    ED Prescriptions     Medication Sig Dispense Auth. Provider   trimethoprim-polymyxin b (POLYTRIM) ophthalmic solution Place 1 drop into the right eye every 8 (eight) hours. 10 mL Hans Eden, NP      PDMP not reviewed this encounter.   Hans Eden, NP 07/07/22 1505

## 2022-07-10 ENCOUNTER — Telehealth: Payer: Self-pay

## 2022-07-10 NOTE — Telephone Encounter (Signed)
Called patient and left VM asking her to call back and reschedule her appt from April 15th to another day.  Alison Warren

## 2022-07-24 ENCOUNTER — Ambulatory Visit: Payer: Medicare Other | Admitting: Internal Medicine

## 2022-07-26 ENCOUNTER — Ambulatory Visit (INDEPENDENT_AMBULATORY_CARE_PROVIDER_SITE_OTHER): Payer: Medicare Other | Admitting: Internal Medicine

## 2022-07-26 ENCOUNTER — Encounter: Payer: Self-pay | Admitting: Internal Medicine

## 2022-07-26 VITALS — BP 120/78 | HR 71 | Ht 64.0 in | Wt 160.0 lb

## 2022-07-26 DIAGNOSIS — K7460 Unspecified cirrhosis of liver: Secondary | ICD-10-CM | POA: Diagnosis not present

## 2022-07-26 DIAGNOSIS — E1169 Type 2 diabetes mellitus with other specified complication: Secondary | ICD-10-CM

## 2022-07-26 DIAGNOSIS — E785 Hyperlipidemia, unspecified: Secondary | ICD-10-CM

## 2022-07-26 DIAGNOSIS — E559 Vitamin D deficiency, unspecified: Secondary | ICD-10-CM | POA: Diagnosis not present

## 2022-07-26 DIAGNOSIS — L299 Pruritus, unspecified: Secondary | ICD-10-CM | POA: Diagnosis not present

## 2022-07-26 DIAGNOSIS — I700 Atherosclerosis of aorta: Secondary | ICD-10-CM

## 2022-07-26 DIAGNOSIS — I10 Essential (primary) hypertension: Secondary | ICD-10-CM

## 2022-07-26 DIAGNOSIS — E118 Type 2 diabetes mellitus with unspecified complications: Secondary | ICD-10-CM

## 2022-07-26 NOTE — Assessment & Plan Note (Signed)
LDL cholesterol 54 on Zetia Hx of intolerance to statins?

## 2022-07-26 NOTE — Assessment & Plan Note (Signed)
Clinically stable exam with well controlled BP on coreg and amlodipine. Tolerating medications without side effects. Pt to continue current regimen and low sodium diet.

## 2022-07-26 NOTE — Assessment & Plan Note (Signed)
Taking vitamin D high dose weekly

## 2022-07-26 NOTE — Assessment & Plan Note (Signed)
Clinically stable without s/s of hypoglycemia. Tolerating metformin well without side effects or other concerns. Lab Results  Component Value Date   HGBA1C 6.9 (H) 03/24/2022

## 2022-07-26 NOTE — Assessment & Plan Note (Signed)
Seen by GI - Dr. Kathryne Hitch in 2021 No recommendations noted. Pt does not consume alcohol.

## 2022-07-26 NOTE — Patient Instructions (Signed)
Try taking Claritin 10 mg once a day as needed for itching.

## 2022-07-26 NOTE — Assessment & Plan Note (Signed)
Lab Results  Component Value Date   LDLCALC 54 11/23/2021  On Zetia.

## 2022-07-26 NOTE — Progress Notes (Signed)
Date:  07/26/2022   Name:  Alison Warren   DOB:  11-Mar-1946   MRN:  [id number]   Chief Complaint: Hypertension  Hypertension This is a chronic problem. The problem is controlled. Pertinent negatives include no chest pain, headaches, palpitations or shortness of breath. Past treatments include calcium channel blockers and beta blockers. The current treatment provides significant improvement. There is no history of kidney disease, CAD/MI or CVA.  Diabetes She presents for her follow-up diabetic visit. She has type 2 diabetes mellitus. Her disease course has been stable. Pertinent negatives for hypoglycemia include no dizziness, headaches, nervousness/anxiousness or tremors. Pertinent negatives for diabetes include no chest pain, no fatigue, no polydipsia, no polyuria and no weakness. Pertinent negatives for diabetic complications include no CVA. Current diabetic treatment includes oral agent (monotherapy) (metformin). She is compliant with treatment all of the time.  Hyperlipidemia This is a chronic problem. The problem is controlled. Pertinent negatives include no chest pain or shortness of breath. Current antihyperlipidemic treatment includes ezetimibe. The current treatment provides significant improvement of lipids.  Gastroesophageal Reflux She complains of heartburn. She reports no abdominal pain, no chest pain, no coughing or no wheezing. This is a recurrent problem. The problem occurs rarely. Pertinent negatives include no fatigue. She has tried a PPI for the symptoms.    Lab Results  Component Value Date   NA 140 03/24/2022   K 4.4 03/24/2022   CO2 22 03/24/2022   GLUCOSE 118 (H) 03/24/2022   BUN 11 03/24/2022   CREATININE 0.84 03/24/2022   CALCIUM 10.4 (H) 03/24/2022   EGFR 72 03/24/2022   GFRNONAA >60 07/03/2021   Lab Results  Component Value Date   CHOL 161 11/23/2021   HDL 48 11/23/2021   LDLCALC 54 11/23/2021   TRIG 392 (H) 11/23/2021   CHOLHDL 3.4 11/23/2021   No  results found for: "TSH" Lab Results  Component Value Date   HGBA1C 6.9 (H) 03/24/2022   Lab Results  Component Value Date   WBC 7.7 11/23/2021   HGB 13.9 11/23/2021   HCT 42.9 11/23/2021   MCV 87 11/23/2021   PLT 226 11/23/2021   Lab Results  Component Value Date   ALT 33 (H) 11/23/2021   AST 39 11/23/2021   ALKPHOS 97 11/23/2021   BILITOT 0.3 11/23/2021   Lab Results  Component Value Date   VD25OH 22.8 (L) 03/24/2022     Review of Systems  Constitutional:  Negative for appetite change, fatigue, fever and unexpected weight change.  HENT:  Positive for hearing loss. Negative for nosebleeds, tinnitus and trouble swallowing.   Eyes:  Positive for visual disturbance.  Respiratory:  Negative for cough, chest tightness, shortness of breath and wheezing.   Cardiovascular:  Negative for chest pain, palpitations and leg swelling.  Gastrointestinal:  Positive for heartburn. Negative for abdominal pain, constipation and diarrhea.  Endocrine: Negative for polydipsia and polyuria.  Genitourinary:  Negative for dysuria and hematuria.  Musculoskeletal:  Positive for arthralgias (knee pain).  Neurological:  Negative for dizziness, tremors, weakness, light-headedness, numbness and headaches.  Psychiatric/Behavioral:  Negative for dysphoric mood and sleep disturbance. The patient is not nervous/anxious.     Patient Active Problem List   Diagnosis Date Noted   Pruritic disorder 07/26/2022   Cirrhosis of liver without ascites, unspecified hepatic cirrhosis type 07/05/2021   Benign essential hypertension 12/20/2020   Degenerative disc disease, cervical 03/23/2020   Chronic bilateral low back pain with bilateral sciatica 02/12/2020   Lumbar spondylosis with  myelopathy 02/09/2020   Aortic atherosclerosis 01/19/2020   GERD (gastroesophageal reflux disease) 11/21/2018   Hyperlipidemia associated with type 2 diabetes mellitus 11/21/2018   Localized osteoarthritis of left knee 05/09/2018    B12 deficiency 04/30/2018   Obesity (BMI 30.0-34.9) 09/14/2017   Hepatic steatosis 02/26/2017   Diverticulitis of colon 09/22/2016   Vitamin D deficiency 09/01/2016   Type II diabetes mellitus with complication 08/24/2016   Hx of adenomatous colonic polyps 02/24/2016   NAFLD (nonalcoholic fatty liver disease) 09/81/1914   Osteopenia of multiple sites 09/09/2015   Seasonal allergic rhinitis due to pollen 08/24/2015   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/2015   Abnormal mammogram with microcalcification 07/02/2014   Muscle cramp, nocturnal 08/20/2013   Hearing loss of both ears 08/19/2013    Allergies  Allergen Reactions   Duloxetine Nausea And Vomiting   Tramadol Nausea And Vomiting   Motrin [Ibuprofen] Rash    Past Surgical History:  Procedure Laterality Date   CATARACT EXTRACTION W/PHACO Right 12/07/2014   Procedure: CATARACT EXTRACTION PHACO AND INTRAOCULAR LENS PLACEMENT ([facility]);  Surgeon: Sherald Hess, MD;  Location[facility];  Service: Ophthalmology;  Laterality: Right;  DIABETIC - oral meds   CHOLECYSTECTOMY  2007   COLONOSCOPY N/A 02/08/2016   Procedure: COLONOSCOPY;  Surgeon: Christena Deem, MD;  Location: [facility];  Service: Endoscopy;  Laterality: N/A;  Diabetes   MASTOIDECTOMY     THUMB AMPUTATION     removal of 2nd thumb   TONSILLECTOMY      Social History   Tobacco Use   Smoking status: Never   Smokeless tobacco: Never  Vaping Use   Vaping Use: Never used  Substance Use Topics   Alcohol use: No   Drug use: No     Medication list has been reviewed and updated.  Current Meds  Medication Sig   acetaminophen (TYLENOL) 500 MG tablet Take 500 mg by mouth every 6 (six) hours as needed for moderate pain, fever or headache.    amLODipine (NORVASC) 5 MG tablet TAKE 1 TABLET (5 MG TOTAL) BY MOUTH DAILY.   aspirin 81 MG tablet Take 81 mg by mouth daily. AM   carvedilol (COREG) 3.125 MG tablet TAKE 1 TABLET BY MOUTH TWICE A DAY WITH  FOOD   ezetimibe (ZETIA) 10 MG tablet TAKE 1 TABLET BY MOUTH EVERY DAY   glucose blood (ONETOUCH ULTRA) test strip Use once daily   metFORMIN (GLUCOPHAGE-XR) 500 MG 24 hr tablet TAKE 2 TABLETS (1,000 MG TOTAL) BY MOUTH IN THE MORNING AND AT BEDTIME.   omeprazole (PRILOSEC) 40 MG capsule TAKE 1 CAPSULE (40 MG TOTAL) BY MOUTH DAILY.   OneTouch Delica Lancets 33G MISC 1 each by Does not apply route See admin instructions.   Vitamin D, Cholecalciferol, 25 MCG (1000 UT) TABS Take by mouth.   Vitamin D, Ergocalciferol, (DRISDOL) 1.25 MG (50000 UNIT) CAPS capsule Take 1 capsule (50,000 Units total) by mouth every 7 (seven) days.       03/24/2022    1:29 PM 11/23/2021    1:25 PM 09/21/2021    2:35 PM 07/20/2021    1:11 PM  GAD 7 : Generalized Anxiety Score  Nervous, Anxious, on Edge 0 0 0 0  Control/stop worrying 0 0 0 0  Worry too much - different things 0 0 0 0  Trouble relaxing 0 0 0 0  Restless 0 0 0 0  Easily annoyed or irritable 0 0 0 0  Afraid - awful might happen 0  0 0 0  Total GAD 7 Score 0 0 0 0  Anxiety Difficulty Not difficult at all Not difficult at all Not difficult at all Not difficult at all       03/24/2022    1:29 PM 02/22/2022    2:46 PM 11/23/2021    1:24 PM  Depression screen PHQ 2/9  Decreased Interest 0 0 0  Down, Depressed, Hopeless 0 0 0  PHQ - 2 Score 0 0 0  Altered sleeping 0 0 0  Tired, decreased energy 0 0 0  Change in appetite 0 0 0  Feeling bad or failure about yourself  0 0 0  Trouble concentrating 0 0 0  Moving slowly or fidgety/restless 0 0 0  Suicidal thoughts 0 0 0  PHQ-9 Score 0 0 0  Difficult doing work/chores Not difficult at all Not difficult at all Not difficult at all    BP Readings from Last 3 Encounters:  07/26/22 120/78  07/07/22 118/80  03/24/22 112/62    Physical Exam Vitals and nursing note reviewed.  Constitutional:      General: She is not in acute distress.    Appearance: Normal appearance. She is well-developed.  HENT:      Head: Normocephalic and atraumatic.  Cardiovascular:     Rate and Rhythm: Normal rate and regular rhythm.     Heart sounds: No murmur heard. Pulmonary:     Effort: Pulmonary effort is normal. No respiratory distress.     Breath sounds: No wheezing or rhonchi.  Musculoskeletal:     Cervical back: Normal range of motion.     Right lower leg: No edema.     Left lower leg: No edema.  Skin:    General: Skin is warm and dry.     Capillary Refill: Capillary refill takes less than 2 seconds.     Findings: No rash.  Neurological:     General: No focal deficit present.     Mental Status: She is alert and oriented to person, place, and time.  Psychiatric:        Mood and Affect: Mood normal.        Behavior: Behavior normal.     Wt Readings from Last 3 Encounters:  07/26/22 160 lb (72.6 kg)  07/07/22 150 lb (68 kg)  03/24/22 159 lb (72.1 kg)    BP 120/78   Pulse 71   Ht  (1.626 m)   Wt 160 lb (72.6 kg)   SpO2 98%   BMI 27.46 kg/m   Assessment and Plan:  Problem List Items Addressed This Visit       Cardiovascular and Mediastinum   Aortic atherosclerosis    Lab Results  Component Value Date   LDLCALC 54 11/23/2021  On Zetia.      Benign essential hypertension - Primary (Chronic)    Clinically stable exam with well controlled BP on coreg and amlodipine. Tolerating medications without side effects. Pt to continue current regimen and low sodium diet.       Relevant Orders   Basic metabolic panel     Digestive   Cirrhosis of liver without ascites, unspecified hepatic cirrhosis type (Chronic)    Seen by GI - Dr. Kathryne Hitch in 2021 No recommendations noted. Pt does not consume alcohol.        Endocrine   Hyperlipidemia associated with type 2 diabetes mellitus (Chronic)    LDL cholesterol 54 on Zetia Hx of intolerance to statins?  Type II diabetes mellitus with complication (Chronic)    Clinically stable without s/s of hypoglycemia. Tolerating  metformin well without side effects or other concerns. Lab Results  Component Value Date   HGBA1C 6.9 (H) 03/24/2022        Relevant Orders   Hemoglobin A1c     Other   Pruritic disorder   Vitamin D deficiency (Chronic)    Taking vitamin D high dose weekly      Relevant Orders   VITAMIN D 25 Hydroxy (Vit-D Deficiency, Fractures)    Return in about 4 months (around 11/25/2022) for DM, HTN, fasting labs.   Partially dictated using Dragon software, any errors are not intentional.  Reubin Milan, MD [facility] [place], [place]

## 2022-07-27 LAB — VITAMIN D 25 HYDROXY (VIT D DEFICIENCY, FRACTURES): Vit D, 25-Hydroxy: 40.6 ng/mL (ref 30.0–100.0)

## 2022-07-27 LAB — BASIC METABOLIC PANEL WITH GFR
BUN/Creatinine Ratio: 14 (ref 12–28)
BUN: 12 mg/dL (ref 8–27)
CO2: 24 mmol/L (ref 20–29)
Calcium: 10.3 mg/dL (ref 8.7–10.3)
Chloride: 100 mmol/L (ref 96–106)
Creatinine, Ser: 0.85 mg/dL (ref 0.57–1.00)
Glucose: 120 mg/dL — ABNORMAL HIGH (ref 70–99)
Potassium: 4.6 mmol/L (ref 3.5–5.2)
Sodium: 140 mmol/L (ref 134–144)
eGFR: 70 mL/min/{1.73_m2}

## 2022-07-27 LAB — HEMOGLOBIN A1C
Est. average glucose Bld gHb Est-mCnc: 174 mg/dL
Hgb A1c MFr Bld: 7.7 % — ABNORMAL HIGH (ref 4.8–5.6)

## 2022-08-06 ENCOUNTER — Other Ambulatory Visit: Payer: Self-pay | Admitting: Internal Medicine

## 2022-09-15 ENCOUNTER — Other Ambulatory Visit: Payer: Self-pay | Admitting: Internal Medicine

## 2022-09-15 DIAGNOSIS — E118 Type 2 diabetes mellitus with unspecified complications: Secondary | ICD-10-CM

## 2022-09-15 DIAGNOSIS — E1169 Type 2 diabetes mellitus with other specified complication: Secondary | ICD-10-CM

## 2022-09-15 DIAGNOSIS — I10 Essential (primary) hypertension: Secondary | ICD-10-CM

## 2022-09-15 NOTE — Telephone Encounter (Signed)
Requested Prescriptions  Pending Prescriptions Disp Refills   ezetimibe (ZETIA) 10 MG tablet [Pharmacy Med Name: EZETIMIBE 10 MG TABLET] 90 tablet 1    Sig: TAKE 1 TABLET BY MOUTH EVERY DAY     Cardiovascular:  Antilipid - Sterol Transport Inhibitors Failed - 09/15/2022  2:38 AM      Failed - ALT in normal range and within 360 days    ALT  Date Value Ref Range Status  11/23/2021 33 (H) 0 - 32 IU/L Final   SGPT (ALT)  Date Value Ref Range Status  09/06/2013 52 12 - 78 U/L Final         Failed - Lipid Panel in normal range within the last 12 months    Cholesterol, Total  Date Value Ref Range Status  11/23/2021 161 100 - 199 mg/dL Final   LDL Chol Calc (NIH)  Date Value Ref Range Status  11/23/2021 54 0 - 99 mg/dL Final   HDL  Date Value Ref Range Status  11/23/2021 48 >39 mg/dL Final   Triglycerides  Date Value Ref Range Status  11/23/2021 392 (H) 0 - 149 mg/dL Final         Passed - AST in normal range and within 360 days    AST  Date Value Ref Range Status  11/23/2021 39 0 - 40 IU/L Final   SGOT(AST)  Date Value Ref Range Status  09/06/2013 41 (H) 15 - 37 Unit/L Final         Passed - Patient is not pregnant      Passed - Valid encounter within last 12 months    Recent Outpatient Visits           1 month ago Benign essential hypertension   [place] [facility] at [facility], Nyoka Cowden, MD   5 months ago Type II diabetes mellitus with complication [facility])   [place] [facility] at [facility], Nyoka Cowden, MD   9 months ago Benign essential hypertension   [place] [facility] at [facility], Nyoka Cowden, MD   11 months ago Benign essential hypertension   [place] [facility] at [facility], Nyoka Cowden, MD   1 year ago Benign essential hypertension   [place] [facility] at [facility], Nyoka Cowden,  MD       Future Appointments             In 2 months Judithann Graves Nyoka Cowden, MD [facility] at [facility]             carvedilol (COREG) 3.125 MG tablet [Pharmacy Med Name: CARVEDILOL 3.125 MG TABLET] 180 tablet 1    Sig: TAKE 1 TABLET BY MOUTH TWICE A DAY WITH FOOD     Cardiovascular: Beta Blockers 3 Failed - 09/15/2022  2:38 AM      Failed - ALT in normal range and within 360 days    ALT  Date Value Ref Range Status  11/23/2021 33 (H) 0 - 32 IU/L Final   SGPT (ALT)  Date Value Ref Range Status  09/06/2013 52 12 - 78 U/L Final         Passed - Cr in normal range and within 360 days    Creatinine  Date Value Ref Range Status  09/07/2013 0.89 0.60 - 1.30 mg/dL Final   Creatinine,  Ser  Date Value Ref Range Status  07/26/2022 0.85 0.57 - 1.00 mg/dL Final         Passed - AST in normal range and within 360 days    AST  Date Value Ref Range Status  11/23/2021 39 0 - 40 IU/L Final   SGOT(AST)  Date Value Ref Range Status  09/06/2013 41 (H) 15 - 37 Unit/L Final         Passed - Last BP in normal range    BP Readings from Last 1 Encounters:  07/26/22 120/78         Passed - Last Heart Rate in normal range    Pulse Readings from Last 1 Encounters:  07/26/22 71         Passed - Valid encounter within last 6 months    Recent Outpatient Visits           1 month ago Benign essential hypertension   [place] [facility] at [facility], Nyoka Cowden, MD   5 months ago Type II diabetes mellitus with complication [facility])   [place] [facility] at [facility], Nyoka Cowden, MD   9 months ago Benign essential hypertension   [place] [facility] at [facility], Nyoka Cowden, MD   11 months ago Benign essential hypertension   [place] [facility] at [facility], Nyoka Cowden, MD   1 year ago Benign essential  hypertension   [place] [facility] at [facility], Nyoka Cowden, MD       Future Appointments             In 2 months Judithann Graves Nyoka Cowden, MD [facility] at [facility]             amLODipine (NORVASC) 5 MG tablet [Pharmacy Med Name: AMLODIPINE BESYLATE 5 MG TAB] 90 tablet 1    Sig: TAKE 1 TABLET (5 MG TOTAL) BY MOUTH DAILY.     Cardiovascular: Calcium Channel Blockers 2 Passed - 09/15/2022  2:38 AM      Passed - Last BP in normal range    BP Readings from Last 1 Encounters:  07/26/22 120/78         Passed - Last Heart Rate in normal range    Pulse Readings from Last 1 Encounters:  07/26/22 71         Passed - Valid encounter within last 6 months    Recent Outpatient Visits           1 month ago Benign essential hypertension   [place] [facility] at [facility], Nyoka Cowden, MD   5 months ago Type II diabetes mellitus with complication [facility])   [place] [facility] at [facility], Nyoka Cowden, MD   9 months ago Benign essential hypertension   [place] [facility] at [facility], Nyoka Cowden, MD   11 months ago Benign essential hypertension   [place] [facility] at [facility], Nyoka Cowden, MD   1 year ago Benign essential hypertension   [place] [facility] at [facility], Nyoka Cowden, MD       Future Appointments             In  2 months Reubin Milan, MD [facility] at [facility], [facility]             metFORMIN (GLUCOPHAGE-XR) 500 MG 24 hr tablet [Pharmacy Med Name: METFORMIN HCL ER 500 MG TABLET] 360 tablet 1    Sig: TAKE 2 TABLETS (1,000 MG TOTAL) BY MOUTH IN THE MORNING AND AT BEDTIME.     Endocrinology:  Diabetes - Biguanides Passed - 09/15/2022  2:38 AM      Passed - Cr in normal range and  within 360 days    Creatinine  Date Value Ref Range Status  09/07/2013 0.89 0.60 - 1.30 mg/dL Final   Creatinine, Ser  Date Value Ref Range Status  07/26/2022 0.85 0.57 - 1.00 mg/dL Final         Passed - HBA1C is between 0 and 7.9 and within 180 days    Hgb A1c MFr Bld  Date Value Ref Range Status  07/26/2022 7.7 (H) 4.8 - 5.6 % Final    Comment:             Prediabetes: 5.7 - 6.4          Diabetes: >6.4          Glycemic control for adults with diabetes: <7.0          Passed - eGFR in normal range and within 360 days    EGFR (African American)  Date Value Ref Range Status  09/07/2013 >60  Final   GFR calc Af Amer  Date Value Ref Range Status  10/18/2016 >60 >60 mL/min Final    Comment:    (NOTE) The eGFR has been calculated using the CKD EPI equation. This calculation has not been validated in all clinical situations. eGFR's persistently <60 mL/min signify possible Chronic Kidney Disease.    EGFR (Non-African Amer.)  Date Value Ref Range Status  09/07/2013 >60  Final    Comment:    eGFR values <54mL/min/1.73 m2 may be an indication of chronic kidney disease (CKD). Calculated eGFR is useful in patients with stable renal function. The eGFR calculation will not be reliable in acutely ill patients when serum creatinine is changing rapidly. It is not useful in  patients on dialysis. The eGFR calculation may not be applicable to patients at the low and high extremes of body sizes, pregnant women, and vegetarians.    GFR, Estimated  Date Value Ref Range Status  07/03/2021 >60 >60 mL/min Final    Comment:    (NOTE) Calculated using the CKD-EPI Creatinine Equation (2021)    eGFR  Date Value Ref Range Status  07/26/2022 70 >59 mL/min/1.73 Final         Passed - B12 Level in normal range and within 720 days    Vitamin B-12  Date Value Ref Range Status  12/20/2020 397 232 - 1,245 pg/mL Final         Passed - Valid encounter within last 6 months    Recent  Outpatient Visits           1 month ago Benign essential hypertension   [place] [facility] at [facility], Nyoka Cowden, MD   5 months ago Type II diabetes mellitus with complication [facility])   [place] [facility] at [facility], Nyoka Cowden, MD   9 months ago Benign essential hypertension   [facility] at [facility], Nyoka Cowden,  MD   11 months ago Benign essential hypertension   [place] [facility] at [facility], Nyoka Cowden, MD   1 year ago Benign essential hypertension   [place] [facility] at [facility], Nyoka Cowden, MD       Future Appointments             In 2 months Judithann Graves Nyoka Cowden, MD [facility] at [facility]            Passed - CBC within normal limits and completed in the last 12 months    WBC  Date Value Ref Range Status  11/23/2021 7.7 3.4 - 10.8 x10E3/uL Final  07/03/2021 10.1 4.0 - 10.5 K/uL Final   RBC  Date Value Ref Range Status  11/23/2021 4.93 3.77 - 5.28 x10E6/uL Final  07/03/2021 5.17 (H) 3.87 - 5.11 MIL/uL Final   Hemoglobin  Date Value Ref Range Status  11/23/2021 13.9 11.1 - 15.9 g/dL Final   Hematocrit  Date Value Ref Range Status  11/23/2021 42.9 34.0 - 46.6 % Final   MCHC  Date Value Ref Range Status  11/23/2021 32.4 31.5 - 35.7 g/dL Final  04/12/7251 66.4 30.0 - 36.0 g/dL Final   [facility]  Date Value Ref Range Status  11/23/2021 28.2 26.6 - 33.0 pg Final  07/03/2021 27.7 26.0 - 34.0 pg Final   MCV  Date Value Ref Range Status  11/23/2021 87 79 - 97 fL Final  09/07/2013 89 80 - 100 fL Final   No results found for: "PLTCOUNTKUC", "LABPLAT", "POCPLA" RDW  Date Value Ref Range Status  11/23/2021 13.7 11.7 - 15.4 % Final  09/07/2013 14.3 11.5 - 14.5 % Final

## 2022-11-14 ENCOUNTER — Ambulatory Visit: Payer: Medicare Other | Admitting: Internal Medicine

## 2022-11-14 ENCOUNTER — Encounter: Payer: Self-pay | Admitting: Internal Medicine

## 2022-11-14 VITALS — BP 128/70 | HR 74 | Ht 64.0 in | Wt 161.0 lb

## 2022-11-14 DIAGNOSIS — E785 Hyperlipidemia, unspecified: Secondary | ICD-10-CM | POA: Diagnosis not present

## 2022-11-14 DIAGNOSIS — Z7984 Long term (current) use of oral hypoglycemic drugs: Secondary | ICD-10-CM

## 2022-11-14 DIAGNOSIS — E118 Type 2 diabetes mellitus with unspecified complications: Secondary | ICD-10-CM

## 2022-11-14 DIAGNOSIS — I10 Essential (primary) hypertension: Secondary | ICD-10-CM

## 2022-11-14 DIAGNOSIS — E1169 Type 2 diabetes mellitus with other specified complication: Secondary | ICD-10-CM | POA: Diagnosis not present

## 2022-11-14 NOTE — Progress Notes (Signed)
Date:  11/14/2022   Name:  Alison Warren   DOB:  1945-10-16   MRN:  [id number]   Chief Complaint: Diabetes and Hypertension  Diabetes She presents for her follow-up diabetic visit. She has type 2 diabetes mellitus. Her disease course has been stable. Pertinent negatives for hypoglycemia include no headaches or tremors. Pertinent negatives for diabetes include no chest pain, no fatigue, no polydipsia and no polyuria. Current diabetic treatment includes oral agent (monotherapy). She is compliant with treatment all of the time.  Hypertension This is a chronic problem. The problem is controlled. Pertinent negatives include no chest pain, headaches, palpitations or shortness of breath. Past treatments include beta blockers and calcium channel blockers.  Hyperlipidemia This is a chronic problem. The problem is controlled. Pertinent negatives include no chest pain or shortness of breath. Current antihyperlipidemic treatment includes ezetimibe.    Lab Results  Component Value Date   NA 140 07/26/2022   K 4.6 07/26/2022   CO2 24 07/26/2022   GLUCOSE 120 (H) 07/26/2022   BUN 12 07/26/2022   CREATININE 0.85 07/26/2022   CALCIUM 10.3 07/26/2022   EGFR 70 07/26/2022   GFRNONAA >60 07/03/2021   Lab Results  Component Value Date   CHOL 161 11/23/2021   HDL 48 11/23/2021   LDLCALC 54 11/23/2021   TRIG 392 (H) 11/23/2021   CHOLHDL 3.4 11/23/2021   No results found for: "TSH" Lab Results  Component Value Date   HGBA1C 7.7 (H) 07/26/2022   Lab Results  Component Value Date   WBC 7.7 11/23/2021   HGB 13.9 11/23/2021   HCT 42.9 11/23/2021   MCV 87 11/23/2021   PLT 226 11/23/2021   Lab Results  Component Value Date   ALT 33 (H) 11/23/2021   AST 39 11/23/2021   ALKPHOS 97 11/23/2021   BILITOT 0.3 11/23/2021   Lab Results  Component Value Date   VD25OH 40.6 07/26/2022     Review of Systems  Constitutional:  Negative for appetite change, fatigue, fever and unexpected weight  change.  HENT:  Positive for hearing loss. Negative for tinnitus and trouble swallowing.   Eyes:  Positive for visual disturbance.  Respiratory:  Negative for cough, chest tightness and shortness of breath.   Cardiovascular:  Negative for chest pain, palpitations and leg swelling.  Gastrointestinal:  Negative for abdominal pain.  Endocrine: Negative for polydipsia and polyuria.  Genitourinary:  Negative for dysuria and hematuria.  Musculoskeletal:  Negative for arthralgias.  Neurological:  Negative for tremors, numbness and headaches.  Psychiatric/Behavioral:  Negative for dysphoric mood.     Patient Active Problem List   Diagnosis Date Noted   Pruritic disorder 07/26/2022   Cirrhosis of liver without ascites, unspecified hepatic cirrhosis type (HCC) 07/05/2021   Benign essential hypertension 12/20/2020   Degenerative disc disease, cervical 03/23/2020   Chronic bilateral low back pain with bilateral sciatica 02/12/2020   Lumbar spondylosis with myelopathy 02/09/2020   Aortic atherosclerosis (HCC) 01/19/2020   GERD (gastroesophageal reflux disease) 11/21/2018   Hyperlipidemia associated with type 2 diabetes mellitus (HCC) 11/21/2018   Localized osteoarthritis of left knee 05/09/2018   B12 deficiency 04/30/2018   Obesity (BMI 30.0-34.9) 09/14/2017   Hepatic steatosis 02/26/2017   Diverticulitis of colon 09/22/2016   Vitamin D deficiency 09/01/2016   Type II diabetes mellitus with complication (HCC) 08/24/2016   Hx of adenomatous colonic polyps 02/24/2016   NAFLD (nonalcoholic fatty liver disease) 40/98/1191   Osteopenia of multiple sites 09/09/2015   Seasonal allergic  rhinitis due to pollen 08/24/2015   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/2015   Abnormal mammogram with microcalcification 07/02/2014   Muscle cramp, nocturnal 08/20/2013   Hearing loss of both ears 08/19/2013    Allergies  Allergen Reactions   Duloxetine Nausea And Vomiting   Tramadol Nausea And Vomiting    Motrin [Ibuprofen] Rash    Past Surgical History:  Procedure Laterality Date   CATARACT EXTRACTION W/PHACO Right 12/07/2014   Procedure: CATARACT EXTRACTION PHACO AND INTRAOCULAR LENS PLACEMENT (IOC);  Surgeon: Sherald Hess, MD;  Location: [facility];  Service: Ophthalmology;  Laterality: Right;  DIABETIC - oral meds   CHOLECYSTECTOMY  2007   COLONOSCOPY N/A 02/08/2016   Procedure: COLONOSCOPY;  Surgeon: Christena Deem, MD;  Location[facility];  Service: Endoscopy;  Laterality: N/A;  Diabetes   MASTOIDECTOMY     THUMB AMPUTATION     removal of 2nd thumb   TONSILLECTOMY      Social History   Tobacco Use   Smoking status: Never   Smokeless tobacco: Never  Vaping Use   Vaping status: Never Used  Substance Use Topics   Alcohol use: No   Drug use: No     Medication list has been reviewed and updated.  Current Meds  Medication Sig   acetaminophen (TYLENOL) 500 MG tablet Take 500 mg by mouth every 6 (six) hours as needed for moderate pain, fever or headache.    amLODipine (NORVASC) 5 MG tablet TAKE 1 TABLET (5 MG TOTAL) BY MOUTH DAILY.   aspirin 81 MG tablet Take 81 mg by mouth daily. AM   carvedilol (COREG) 3.125 MG tablet TAKE 1 TABLET BY MOUTH TWICE A DAY WITH FOOD   ezetimibe (ZETIA) 10 MG tablet TAKE 1 TABLET BY MOUTH EVERY DAY   glucose blood (ONETOUCH ULTRA) test strip Use once daily   metFORMIN (GLUCOPHAGE-XR) 500 MG 24 hr tablet TAKE 2 TABLETS (1,000 MG TOTAL) BY MOUTH IN THE MORNING AND AT BEDTIME.   omeprazole (PRILOSEC) 40 MG capsule TAKE 1 CAPSULE (40 MG TOTAL) BY MOUTH DAILY.   OneTouch Delica Lancets 33G MISC 1 each by Does not apply route See admin instructions.   Vitamin D, Ergocalciferol, (DRISDOL) 1.25 MG (50000 UNIT) CAPS capsule Take 1 capsule (50,000 Units total) by mouth every 7 (seven) days.       11/14/2022    8:22 AM 03/24/2022    1:29 PM 11/23/2021    1:25 PM 09/21/2021    2:35 PM  GAD 7 : Generalized Anxiety Score   Nervous, Anxious, on Edge 0 0 0 0  Control/stop worrying 0 0 0 0  Worry too much - different things 0 0 0 0  Trouble relaxing 0 0 0 0  Restless 0 0 0 0  Easily annoyed or irritable 0 0 0 0  Afraid - awful might happen 0 0 0 0  Total GAD 7 Score 0 0 0 0  Anxiety Difficulty Not difficult at all Not difficult at all Not difficult at all Not difficult at all       11/14/2022    8:22 AM 03/24/2022    1:29 PM 02/22/2022    2:46 PM  Depression screen PHQ 2/9  Decreased Interest 1 0 0  Down, Depressed, Hopeless 0 0 0  PHQ - 2 Score 1 0 0  Altered sleeping 1 0 0  Tired, decreased energy 1 0 0  Change in appetite 0 0 0  Feeling bad or failure about yourself  0 0 0  Trouble concentrating 0 0 0  Moving slowly or fidgety/restless 0 0 0  Suicidal thoughts 0 0 0  PHQ-9 Score 3 0 0  Difficult doing work/chores Not difficult at all Not difficult at all Not difficult at all    BP Readings from Last 3 Encounters:  11/14/22 128/70  07/26/22 120/78  07/07/22 118/80    Physical Exam Vitals and nursing note reviewed.  Constitutional:      General: She is not in acute distress.    Appearance: She is well-developed.  HENT:     Head: Normocephalic and atraumatic.  Neck:     Vascular: No carotid bruit.  Cardiovascular:     Rate and Rhythm: Normal rate and regular rhythm.     Heart sounds: No murmur heard. Pulmonary:     Effort: Pulmonary effort is normal. No respiratory distress.     Breath sounds: No wheezing or rhonchi.  Musculoskeletal:        General: Deformity (congenital deformity of right foot) present.     Cervical back: Normal range of motion.     Right lower leg: No edema.     Left lower leg: No edema.  Lymphadenopathy:     Cervical: No cervical adenopathy.  Skin:    General: Skin is warm and dry.     Findings: No rash.  Neurological:     General: No focal deficit present.     Mental Status: She is alert and oriented to person, place, and time.  Psychiatric:         Mood and Affect: Mood normal.        Behavior: Behavior normal.    Diabetic Foot Exam - Simple   Simple Foot Form Diabetic Foot exam was performed with the following findings: Yes 11/14/2022  8:35 AM  Visual Inspection See comments: Yes Sensation Testing Intact to touch and monofilament testing bilaterally: Yes Pulse Check Posterior Tibialis and Dorsalis pulse intact bilaterally: Yes Comments Congenital deformity of right foot      Wt Readings from Last 3 Encounters:  11/14/22 161 lb (73 kg)  07/26/22 160 lb (72.6 kg)  07/07/22 150 lb (68 kg)    BP 128/70   Pulse 74   Ht 5\' 4"  (1.626 m)   Wt 161 lb (73 kg)   SpO2 98%   BMI 27.64 kg/m   Assessment and Plan:  Problem List Items Addressed This Visit       Unprioritized   Type II diabetes mellitus with complication (HCC) - Primary (Chronic)    Blood sugars stable without hypoglycemic symptoms or events. Current regimen is metformin. Changes made last visit are none. Lab Results  Component Value Date   HGBA1C 7.7 (H) 07/26/2022         Relevant Orders   Comprehensive metabolic panel   Hemoglobin A1c   Microalbumin / creatinine urine ratio   Hyperlipidemia associated with type 2 diabetes mellitus (HCC) (Chronic)    LDL is  Lab Results  Component Value Date   LDLCALC 54 11/23/2021   Currently being treated with Zetia with good compliance and no concerns.       Relevant Orders   Comprehensive metabolic panel   Lipid panel   Benign essential hypertension (Chronic)    Normal exam with stable BP on Coreg and amlodipine. No concerns or side effects to current medication. No change in regimen; continue low sodium diet.       Relevant Orders   CBC with Differential/Platelet  Return in about 4 months (around 03/16/2023) for DM, HTN.    Reubin Milan, MD [facility] [place]

## 2022-11-14 NOTE — Assessment & Plan Note (Signed)
Blood sugars stable without hypoglycemic symptoms or events. Current regimen is metformin. Changes made last visit are none. Lab Results  Component Value Date   HGBA1C 7.7 (H) 07/26/2022

## 2022-11-14 NOTE — Assessment & Plan Note (Signed)
LDL is  Lab Results  Component Value Date   LDLCALC 54 11/23/2021   Currently being treated with Zetia with good compliance and no concerns.

## 2022-11-14 NOTE — Assessment & Plan Note (Signed)
Normal exam with stable BP on Coreg and amlodipine. No concerns or side effects to current medication. No change in regimen; continue low sodium diet.

## 2022-11-15 ENCOUNTER — Other Ambulatory Visit: Payer: Self-pay

## 2022-11-15 MED ORDER — ATORVASTATIN CALCIUM 10 MG PO TABS: 10.0000 mg | ORAL_TABLET | Freq: Every day | ORAL | 1 refills | Status: DC

## 2022-12-11 ENCOUNTER — Other Ambulatory Visit: Payer: Self-pay | Admitting: Internal Medicine

## 2022-12-11 DIAGNOSIS — E118 Type 2 diabetes mellitus with unspecified complications: Secondary | ICD-10-CM

## 2022-12-23 ENCOUNTER — Encounter: Payer: Self-pay | Admitting: Emergency Medicine

## 2022-12-23 ENCOUNTER — Ambulatory Visit
Admission: EM | Admit: 2022-12-23 | Discharge: 2022-12-23 | Disposition: A | Discharge status: Home / Self Care | Payer: Medicare Other | Attending: Family Medicine | Admitting: Family Medicine

## 2022-12-23 DIAGNOSIS — L03011 Cellulitis of right finger: Secondary | ICD-10-CM

## 2022-12-23 MED ORDER — DOXYCYCLINE HYCLATE 100 MG PO TABS: 100.0000 mg | ORAL_TABLET | Freq: Two times a day (BID) | ORAL | 0 refills | Status: DC

## 2022-12-23 MED ORDER — MUPIROCIN 2 % EX OINT: 1.0000 | TOPICAL_OINTMENT | Freq: Three times a day (TID) | CUTANEOUS | 0 refills | Status: AC

## 2022-12-23 MED ORDER — CLOBETASOL PROPIONATE 0.05 % EX OINT: 1.0000 | TOPICAL_OINTMENT | Freq: Two times a day (BID) | CUTANEOUS | 0 refills | Status: DC

## 2022-12-23 NOTE — ED Triage Notes (Signed)
Patient has two itchy bumps that she noticed on her right thumb 2 days ago.  Patient reports redness, swelling and drainage in her right thumb.

## 2022-12-23 NOTE — Discharge Instructions (Addendum)
Try not to scratch/rub it.  Medication as prescribed.

## 2022-12-23 NOTE — ED Provider Notes (Signed)
[facility]    CSN: [id number] Arrival date & time: 12/23/22  1244      History   Chief Complaint Chief Complaint  Patient presents with   Insect Bite    Right thumb    HPI 77 year old female presents for evaluation of the above.  Patient has had some raised areas to her right thumb.  Started 2 days ago.  She has now developed redness, swelling, pain, and some drainage from the areas.  She has an additional area of concern to the index finger as well.  She is concerned about a spider bite.  No fever.  Pain 6-10 in severity.  No relieving factors.  Past Medical History:  Diagnosis Date   Arthritis    Bronchitis    none currently   Chronic cough    Costochondritis    Diabetes mellitus without complication (HCC)    8-9 years   Diverticulitis    Diverticulitis 08/2016   GERD (gastroesophageal reflux disease)    Hypercholesteremia    Hypertension    Hypomagnesemia 08/19/2014   Lactic acidosis 08/24/2016   Wears hearing aid    right ear    Patient Active Problem List   Diagnosis Date Noted   Pruritic disorder 07/26/2022   Cirrhosis of liver without ascites, unspecified hepatic cirrhosis type (HCC) 07/05/2021   Benign essential hypertension 12/20/2020   Degenerative disc disease, cervical 03/23/2020   Chronic bilateral low back pain with bilateral sciatica 02/12/2020   Lumbar spondylosis with myelopathy 02/09/2020   Aortic atherosclerosis (HCC) 01/19/2020   GERD (gastroesophageal reflux disease) 11/21/2018   Hyperlipidemia associated with type 2 diabetes mellitus (HCC) 11/21/2018   Localized osteoarthritis of left knee 05/09/2018   B12 deficiency 04/30/2018   Obesity (BMI 30.0-34.9) 09/14/2017   Hepatic steatosis 02/26/2017   Diverticulitis of colon 09/22/2016   Vitamin D deficiency 09/01/2016   Type II diabetes mellitus with complication (HCC) 08/24/2016   Hx of adenomatous colonic polyps 02/24/2016   NAFLD (nonalcoholic fatty liver disease) 30/86/5784    Osteopenia of multiple sites 09/09/2015   Seasonal allergic rhinitis due to pollen 08/24/2015   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/2015   Abnormal mammogram with microcalcification 07/02/2014   Muscle cramp, nocturnal 08/20/2013   Hearing loss of both ears 08/19/2013    Past Surgical History:  Procedure Laterality Date   CATARACT EXTRACTION W/PHACO Right 12/07/2014   Procedure: CATARACT EXTRACTION PHACO AND INTRAOCULAR LENS PLACEMENT (IOC);  Surgeon: Sherald Hess, MD;  Location: [facility];  Service: Ophthalmology;  Laterality: Right;  DIABETIC - oral meds   CHOLECYSTECTOMY  2007   COLONOSCOPY N/A 02/08/2016   Procedure: COLONOSCOPY;  Surgeon: Christena Deem, MD;  Location: [facility];  Service: Endoscopy;  Laterality: N/A;  Diabetes   MASTOIDECTOMY     THUMB AMPUTATION     removal of 2nd thumb   TONSILLECTOMY      OB History     Gravida  2   Para  2   Term      Preterm      AB      Living  2      SAB      IAB      Ectopic      Multiple      Live Births               Home Medications    Prior to Admission medications   Medication Sig Start Date End Date Taking? Authorizing Provider  clobetasol ointment (TEMOVATE) 0.05 % Apply 1 Application topically 2 (two) times daily. 12/23/22  Yes Evonne Rinks G, DO  doxycycline (VIBRA-TABS) 100 MG tablet Take 1 tablet (100 mg total) by mouth 2 (two) times daily. Take with food and water. 12/23/22  Yes Rebecah Dangerfield G, DO  mupirocin ointment (BACTROBAN) 2 % Apply 1 Application topically 3 (three) times daily for 7 days. 12/23/22 12/30/22 Yes Davieon Stockham G, DO  acetaminophen (TYLENOL) 500 MG tablet Take 500 mg by mouth every 6 (six) hours as needed for moderate pain, fever or headache.     [provider]  amLODipine (NORVASC) 5 MG tablet TAKE 1 TABLET (5 MG TOTAL) BY MOUTH DAILY. 09/15/22   Reubin Milan, MD  aspirin 81 MG tablet Take 81 mg by mouth daily. AM    [provider]  atorvastatin (LIPITOR) 10 MG tablet Take 1 tablet (10 mg total) by mouth daily. 11/15/22   Reubin Milan, MD  carvedilol (COREG) 3.125 MG tablet TAKE 1 TABLET BY MOUTH TWICE A DAY WITH FOOD 09/15/22   Reubin Milan, MD  ezetimibe (ZETIA) 10 MG tablet TAKE 1 TABLET BY MOUTH EVERY DAY 09/15/22   Reubin Milan, MD  glucose blood [facility] ULTRA) test strip Use once daily 11/23/21   Reubin Milan, MD  Lancets [facility] DELICA PLUS [place]) MISC 1 EACH BY DOES NOT APPLY ROUTE SEE ADMIN INSTRUCTIONS. 12/12/22   Reubin Milan, MD  metFORMIN (GLUCOPHAGE-XR) 500 MG 24 hr tablet TAKE 2 TABLETS (1,000 MG TOTAL) BY MOUTH IN THE MORNING AND AT BEDTIME. 09/15/22   Reubin Milan, MD  omeprazole (PRILOSEC) 40 MG capsule TAKE 1 CAPSULE (40 MG TOTAL) BY MOUTH DAILY. 12/12/22   Reubin Milan, MD  Vitamin D, Ergocalciferol, (DRISDOL) 1.25 MG (50000 UNIT) CAPS capsule Take 1 capsule (50,000 Units total) by mouth every 7 (seven) days. 03/27/22   Reubin Milan, MD    Family History Family History  Problem Relation Age of Onset   Cancer Mother 1       Cervical   Heart attack Father    Diabetes Neg Hx    COPD Neg Hx     Social History Social History   Tobacco Use   Smoking status: Never   Smokeless tobacco: Never  Vaping Use   Vaping status: Never Used  Substance Use Topics   Alcohol use: No   Drug use: No     Allergies   Duloxetine, Tramadol, and Motrin [ibuprofen]   Review of Systems Review of Systems Per HPI  Physical Exam Triage Vital Signs ED Triage Vitals  Encounter Vitals Group     BP 12/23/22 1328 130/75     Systolic BP Percentile --      Diastolic BP Percentile --      Pulse Rate 12/23/22 1328 76     Resp 12/23/22 1328 14     Temp 12/23/22 1328 98.2 F (36.8 C)     Temp Source 12/23/22 1328 Oral     SpO2 12/23/22 1328 96 %     Weight 12/23/22 1327 160 lb 15 oz (73 kg)     Height 12/23/22 1327 5\' 4"  (1.626 m)     Head Circumference --      Peak Flow  --      Pain Score 12/23/22 1327 6     Pain Loc --      Pain Education --      Exclude from Growth Chart --  No data found.  Updated Vital Signs BP 130/75 (BP Location: Left Arm)   Pulse 76   Temp 98.2 F (36.8 C) (Oral)   Resp 14   Ht 5\' 4"  (1.626 m)   Wt 73 kg   SpO2 96%   BMI 27.62 kg/m   Visual Acuity Right Eye Distance:   Left Eye Distance:   Bilateral Distance:    Right Eye Near:   Left Eye Near:    Bilateral Near:     Physical Exam Vitals and nursing note reviewed.  Constitutional:      General: She is not in acute distress.    Appearance: Normal appearance.  HENT:     Head: Normocephalic and atraumatic.  Pulmonary:     Effort: Pulmonary effort is normal. No respiratory distress.  Skin:    Comments: Right thumb -dorsal aspect of the right thumb at the level of the IP joint with erythema and mild warmth.  She has 2 open areas.  No current drainage.  Right index finger with a small open lesion at the level of the PIP joint.  Mild surrounding erythema.  Neurological:     Mental Status: She is alert.  Psychiatric:        Mood and Affect: Mood normal.        Behavior: Behavior normal.      UC Treatments / Results  Labs (all labs ordered are listed, but only abnormal results are displayed) Labs Reviewed - No data to display  EKG   Radiology No results found.  Procedures Procedures (including critical care time)  Medications Ordered in UC Medications - No data to display  Initial Impression / Assessment and Plan / UC Course  I have reviewed the triage vital signs and the nursing notes.  Pertinent labs & imaging results that were available during my care of the patient were reviewed by me and considered in my medical decision making (see chart for details).    77 year old female presents with cellulitis of the right thumb and index finger.  Placing on doxycycline as well as Bactroban.  Given patient's itching, I am also giving her topical  clobetasol.  Final Clinical Impressions(s) / UC Diagnoses   Final diagnoses:  Cellulitis of finger of right hand     Discharge Instructions      Try not to scratch/rub it.  Medication as prescribed.    ED Prescriptions     Medication Sig Dispense Auth. Provider   doxycycline (VIBRA-TABS) 100 MG tablet Take 1 tablet (100 mg total) by mouth 2 (two) times daily. Take with food and water. 14 tablet Fryda Molenda G, DO   mupirocin ointment (BACTROBAN) 2 % Apply 1 Application topically 3 (three) times daily for 7 days. 30 g Kira Hartl G, DO   clobetasol ointment (TEMOVATE) 0.05 % Apply 1 Application topically 2 (two) times daily. 15 g Tommie Sams, DO      PDMP not reviewed this encounter.   Tommie Sams, [place] 12/23/22 1628

## 2023-01-30 ENCOUNTER — Ambulatory Visit: Payer: Medicare Other | Admitting: Family Medicine

## 2023-01-30 ENCOUNTER — Encounter: Payer: Self-pay | Admitting: Family Medicine

## 2023-01-30 ENCOUNTER — Other Ambulatory Visit (INDEPENDENT_AMBULATORY_CARE_PROVIDER_SITE_OTHER): Payer: Medicare Other | Admitting: Radiology

## 2023-01-30 VITALS — BP 138/86 | HR 88 | Ht 64.0 in | Wt 158.0 lb

## 2023-01-30 DIAGNOSIS — M1712 Unilateral primary osteoarthritis, left knee: Secondary | ICD-10-CM

## 2023-01-30 NOTE — Progress Notes (Signed)
     Primary Care / Sports Medicine Office Visit  Patient Information:  Patient ID: Alison Warren, female DOB: 08-04-1945 Age: 77 y.o. MRN: [id number]   Alison Warren is a pleasant 77 y.o. female presenting with the following:  Chief Complaint  Patient presents with   Knee Pain    X2 ,Left, getting worse, taking advil, volaten gel and tylenol isnt helping, limping when walking,no xray, pain radiates down leg     Vitals:   01/30/23 1427  BP: 138/86  Pulse: 88  SpO2: 96%   Vitals:   01/30/23 1427  Weight: 158 lb (71.7 kg)  Height: 5\' 4"  (1.626 m)   Body mass index is 27.12 kg/m.  No results found.   Independent interpretation of notes and tests performed by another provider:   None  Procedures performed:   Procedure:  Injection of left knee under ultrasound guidance. Ultrasound guidance utilized for anterolateral approach, joint space viusalized Samsung HS60 device utilized with permanent recording / reporting. Verbal informed consent obtained and verified. Skin prepped in a sterile fashion. Ethyl chloride for topical local analgesia.  Completed without difficulty and tolerated well. Medication: triamcinolone acetonide 40 mg/mL suspension for injection 1 mL total and 2 mL lidocaine 1% without epinephrine utilized for needle placement anesthetic Advised to contact for fevers/chills, erythema, induration, drainage, or persistent bleeding.   Pertinent History, Exam, Impression, and Recommendations:   Problem List Items Addressed This Visit       Musculoskeletal and Integument   Primary osteoarthritis of left knee - Primary    Acute on chronic, has received cortisone in the past through outside orthopedics, had worked well. Last injection roughly 2 years prior, per patient. Severe symptoms over past few weeks not responding to ibuprofen.   Exam with diffuse tenderness, maximally noted along medial joint line and with patellofemoral passive mobilization, no  laxity.  Plan: - Given severity of symptoms, patient elected to proceed with left knee cortisone injection, advised on blood sugar rise due to steroids and her recent A1c was reviewed - We will reach out for Zilretta authorization to have on hand at return - Follow-up in 3 months, can consider Zilretta at that time      Relevant Orders   [place] LIMITED JOINT SPACE STRUCTURES LOW LEFT     Orders & Medications Medications: No orders of the defined types were placed in this encounter.  Orders Placed This Encounter  Procedures   [place] LIMITED JOINT SPACE STRUCTURES LOW LEFT     Return in about 3 months (around 05/02/2023).     Jerrol Banana, MD, [facility]   Primary Care Sports Medicine Primary Care and Sports Medicine at [facility]

## 2023-01-30 NOTE — Assessment & Plan Note (Signed)
Acute on chronic, has received cortisone in the past through outside orthopedics, had worked well. Last injection roughly 2 years prior, per patient. Severe symptoms over past few weeks not responding to ibuprofen.   Exam with diffuse tenderness, maximally noted along medial joint line and with patellofemoral passive mobilization, no laxity.  Plan: - Given severity of symptoms, patient elected to proceed with left knee cortisone injection, advised on blood sugar rise due to steroids and her recent A1c was reviewed - We will reach out for Zilretta authorization to have on hand at return - Follow-up in 3 months, can consider Zilretta at that time

## 2023-02-21 ENCOUNTER — Telehealth: Payer: Self-pay | Admitting: Internal Medicine

## 2023-02-21 NOTE — Telephone Encounter (Signed)
Copied from CRM [id number]. Topic: Medicare AWV >> Feb 21, 2023  9:47 AM Payton Doughty wrote: Reason for CRM: Called LVM 02/21/2023 to schedule Annual Wellness Visit  Verlee Rossetti; Care Guide Ambulatory Clinical Support [place] [facility] Direct Dial: [phone]

## 2023-03-11 ENCOUNTER — Other Ambulatory Visit: Payer: Self-pay | Admitting: Internal Medicine

## 2023-03-11 DIAGNOSIS — E1169 Type 2 diabetes mellitus with other specified complication: Secondary | ICD-10-CM

## 2023-03-11 DIAGNOSIS — E118 Type 2 diabetes mellitus with unspecified complications: Secondary | ICD-10-CM

## 2023-03-11 DIAGNOSIS — I10 Essential (primary) hypertension: Secondary | ICD-10-CM

## 2023-03-14 NOTE — Telephone Encounter (Signed)
Requested Prescriptions  Pending Prescriptions Disp Refills   carvedilol (COREG) 3.125 MG tablet [Pharmacy Med Name: CARVEDILOL 3.125 MG TABLET] 180 tablet 1    Sig: TAKE 1 TABLET BY MOUTH TWICE A DAY WITH FOOD     Cardiovascular: Beta Blockers 3 Failed - 03/11/2023  1:12 AM      Failed - AST in normal range and within 360 days    AST  Date Value Ref Range Status  11/14/2022 44 (H) 0 - 40 IU/L Final   SGOT(AST)  Date Value Ref Range Status  09/06/2013 41 (H) 15 - 37 Unit/L Final         Passed - Cr in normal range and within 360 days    Creatinine  Date Value Ref Range Status  09/07/2013 0.89 0.60 - 1.30 mg/dL Final   Creatinine, Ser  Date Value Ref Range Status  11/14/2022 0.76 0.57 - 1.00 mg/dL Final         Passed - ALT in normal range and within 360 days    ALT  Date Value Ref Range Status  11/14/2022 32 0 - 32 IU/L Final   SGPT (ALT)  Date Value Ref Range Status  09/06/2013 52 12 - 78 U/L Final         Passed - Last BP in normal range    BP Readings from Last 1 Encounters:  01/30/23 138/86         Passed - Last Heart Rate in normal range    Pulse Readings from Last 1 Encounters:  01/30/23 88         Passed - Valid encounter within last 6 months    Recent Outpatient Visits           1 month ago Primary osteoarthritis of left knee   [place] [facility] at [facility] Ashley Royalty, Ocie Bob, MD   4 months ago Type II diabetes mellitus with complication [facility])   [place] [facility] Care [facility] Sports Medicine at [facility], Nyoka Cowden, MD   7 months ago Benign essential hypertension   [place] Primary Care & Sports Medicine at [facility], Nyoka Cowden, MD   11 months ago Type II diabetes mellitus with complication [facility])   [place] Primary Care & Sports Medicine at [facility], Nyoka Cowden, MD   1 year ago Benign essential hypertension   [place] [facility] Care & Sports Medicine at [facility], Nyoka Cowden, MD       Future Appointments             In 2 days Reubin Milan, MD [facility] Primary Care & Sports Medicine at [facility], [facility]   In 1 month Ashley Royalty, Ocie Bob, MD [facility] Primary Care & Sports Medicine at [facility], [facility]             amLODipine (NORVASC) 5 MG tablet [Pharmacy Med Name: AMLODIPINE BESYLATE 5 MG TAB] 90 tablet 1    Sig: TAKE 1 TABLET (5 MG TOTAL) BY MOUTH DAILY.     Cardiovascular: Calcium Channel Blockers 2 Passed - 03/11/2023  1:12 AM      Passed - Last BP in normal range    BP Readings from Last 1 Encounters:  01/30/23 138/86         Passed - Last Heart Rate in normal range    Pulse Readings from Last 1 Encounters:  01/30/23 88  Passed - Valid encounter within last 6 months    Recent Outpatient Visits           1 month ago Primary osteoarthritis of left knee   [place] [facility] at [facility] Ashley Royalty, Ocie Bob, MD   4 months ago Type II diabetes mellitus with complication [facility])   [place] [facility] Care & Sports Medicine at [facility], Nyoka Cowden, MD   7 months ago Benign essential hypertension   [place] Primary Care & Sports Medicine at [facility], Nyoka Cowden, MD   11 months ago Type II diabetes mellitus with complication [facility])   [place] Primary Care & Sports Medicine at [facility], Nyoka Cowden, MD   1 year ago Benign essential hypertension   [place] Primary Care & Sports Medicine at [facility], Nyoka Cowden, MD       Future Appointments             In 2 days Reubin Milan, MD [facility] Primary Care & Sports Medicine at [facility], [facility]   In 1 month Ashley Royalty, Ocie Bob, MD [facility] Primary Care & Sports Medicine at [facility], [facility]             metFORMIN (GLUCOPHAGE-XR) 500 MG 24 hr tablet [Pharmacy Med Name: METFORMIN HCL ER 500 MG TABLET] 360 tablet 1    Sig: TAKE 2 TABLETS (1,000 MG  TOTAL) BY MOUTH IN THE MORNING AND AT BEDTIME.     Endocrinology:  Diabetes - Biguanides Failed - 03/11/2023  1:12 AM      Failed - B12 Level in normal range and within 720 days    Vitamin B-12  Date Value Ref Range Status  12/20/2020 397 232 - 1,245 pg/mL Final         Passed - Cr in normal range and within 360 days    Creatinine  Date Value Ref Range Status  09/07/2013 0.89 0.60 - 1.30 mg/dL Final   Creatinine, Ser  Date Value Ref Range Status  11/14/2022 0.76 0.57 - 1.00 mg/dL Final         Passed - HBA1C is between 0 and 7.9 and within 180 days    Hgb A1c MFr Bld  Date Value Ref Range Status  11/14/2022 7.6 (H) 4.8 - 5.6 % Final    Comment:             Prediabetes: 5.7 - 6.4          Diabetes: >6.4          Glycemic control for adults with diabetes: <7.0          Passed - eGFR in normal range and within 360 days    EGFR (African American)  Date Value Ref Range Status  09/07/2013 >60  Final   GFR calc Af Amer  Date Value Ref Range Status  10/18/2016 >60 >60 mL/min Final    Comment:    (NOTE) The eGFR has been calculated using the CKD EPI equation. This calculation has not been validated in all clinical situations. eGFR's persistently <60 mL/min signify possible Chronic Kidney Disease.    EGFR (Non-African Amer.)  Date Value Ref Range Status  09/07/2013 >60  Final    Comment:    eGFR values <56mL/min/1.73 m2 may be an indication of chronic kidney disease (CKD). Calculated eGFR is useful in patients with stable renal function. The eGFR calculation will not be reliable in acutely  ill patients when serum creatinine is changing rapidly. It is not useful in  patients on dialysis. The eGFR calculation may not be applicable to patients at the low and high extremes of body sizes, pregnant women, and vegetarians.    GFR, Estimated  Date Value Ref Range Status  07/03/2021 >60 >60 mL/min Final    Comment:    (NOTE) Calculated using the CKD-EPI Creatinine  Equation (2021)    eGFR  Date Value Ref Range Status  11/14/2022 80 >59 mL/min/1.73 Final         Passed - Valid encounter within last 6 months    Recent Outpatient Visits           1 month ago Primary osteoarthritis of left knee   [place] [facility] at [facility], Ocie Bob, MD   4 months ago Type II diabetes mellitus with complication [facility])   [place] [facility] Care & Sports Medicine at [facility], Nyoka Cowden, MD   7 months ago Benign essential hypertension   [place] Primary Care & Sports Medicine at [facility], Nyoka Cowden, MD   11 months ago Type II diabetes mellitus with complication [facility])   [place] Primary Care & Sports Medicine at [facility], Nyoka Cowden, MD   1 year ago Benign essential hypertension   [place] Primary Care & Sports Medicine at [facility], Nyoka Cowden, MD       Future Appointments             In 2 days Reubin Milan, MD [facility] Primary Care & Sports Medicine at [facility], [facility]   In 1 month Ashley Royalty, Ocie Bob, MD [facility] Health Primary Care & Sports Medicine at [facility], [facility]            Passed - CBC within normal limits and completed in the last 12 months    WBC  Date Value Ref Range Status  11/14/2022 6.5 3.4 - 10.8 x10E3/uL Final  07/03/2021 10.1 4.0 - 10.5 K/uL Final   RBC  Date Value Ref Range Status  11/14/2022 4.95 3.77 - 5.28 x10E6/uL Final  07/03/2021 5.17 (H) 3.87 - 5.11 MIL/uL Final   Hemoglobin  Date Value Ref Range Status  11/14/2022 12.9 11.1 - 15.9 g/dL Final   Hematocrit  Date Value Ref Range Status  11/14/2022 41.1 34.0 - 46.6 % Final   MCHC  Date Value Ref Range Status  11/14/2022 31.4 (L) 31.5 - 35.7 g/dL Final  16/01/9603 54.0 30.0 - 36.0 g/dL Final   [facility]  Date Value Ref Range Status  11/14/2022 26.1 (L) 26.6 - 33.0 pg Final  07/03/2021 27.7 26.0 - 34.0 pg Final   MCV  Date Value Ref Range Status   11/14/2022 83 79 - 97 fL Final  09/07/2013 89 80 - 100 fL Final   No results found for: "PLTCOUNTKUC", "LABPLAT", "POCPLA" RDW  Date Value Ref Range Status  11/14/2022 13.9 11.7 - 15.4 % Final  09/07/2013 14.3 11.5 - 14.5 % Final          ezetimibe (ZETIA) 10 MG tablet [Pharmacy Med Name: EZETIMIBE 10 MG TABLET] 90 tablet 1    Sig: TAKE 1 TABLET BY MOUTH EVERY DAY     Cardiovascular:  Antilipid - Sterol Transport Inhibitors Failed - 03/11/2023  1:12 AM      Failed - AST in normal range and within 360 days    AST  Date Value  Ref Range Status  11/14/2022 44 (H) 0 - 40 IU/L Final   SGOT(AST)  Date Value Ref Range Status  09/06/2013 41 (H) 15 - 37 Unit/L Final         Failed - Lipid Panel in normal range within the last 12 months    Cholesterol, Total  Date Value Ref Range Status  11/14/2022 196 100 - 199 mg/dL Final   LDL Chol Calc (NIH)  Date Value Ref Range Status  11/14/2022 107 (H) 0 - 99 mg/dL Final   HDL  Date Value Ref Range Status  11/14/2022 50 >39 mg/dL Final   Triglycerides  Date Value Ref Range Status  11/14/2022 229 (H) 0 - 149 mg/dL Final         Passed - ALT in normal range and within 360 days    ALT  Date Value Ref Range Status  11/14/2022 32 0 - 32 IU/L Final   SGPT (ALT)  Date Value Ref Range Status  09/06/2013 52 12 - 78 U/L Final         Passed - Patient is not pregnant      Passed - Valid encounter within last 12 months    Recent Outpatient Visits           1 month ago Primary osteoarthritis of left knee   [place] [facility] at [facility] Royalty, Ocie Bob, MD   4 months ago Type II diabetes mellitus with complication [facility])   [place] [facility] Sports Medicine at [facility], Nyoka Cowden, MD   7 months ago Benign essential hypertension   [place] Primary Care & Sports Medicine at [facility], Nyoka Cowden, MD   11 months ago Type II diabetes mellitus with complication  [facility])   [place] [facility] at [facility], Nyoka Cowden, MD   1 year ago Benign essential hypertension    Primary Care & Sports Medicine at [facility], Nyoka Cowden, MD       Future Appointments             In 2 days Judithann Graves, Nyoka Cowden, MD [facility] Sports Medicine at [facility], [facility]   In 1 month Ashley Royalty, Ocie Bob, MD [facility] Primary Care & Sports [facility] at [facility], [facility]

## 2023-03-16 ENCOUNTER — Encounter: Payer: Self-pay | Admitting: Internal Medicine

## 2023-03-16 ENCOUNTER — Ambulatory Visit (INDEPENDENT_AMBULATORY_CARE_PROVIDER_SITE_OTHER): Payer: Medicare Other | Admitting: Internal Medicine

## 2023-03-16 VITALS — BP 112/70 | HR 71 | Ht 64.0 in | Wt 156.0 lb

## 2023-03-16 DIAGNOSIS — E785 Hyperlipidemia, unspecified: Secondary | ICD-10-CM | POA: Diagnosis not present

## 2023-03-16 DIAGNOSIS — Z7984 Long term (current) use of oral hypoglycemic drugs: Secondary | ICD-10-CM | POA: Diagnosis not present

## 2023-03-16 DIAGNOSIS — E559 Vitamin D deficiency, unspecified: Secondary | ICD-10-CM

## 2023-03-16 DIAGNOSIS — R519 Headache, unspecified: Secondary | ICD-10-CM | POA: Diagnosis not present

## 2023-03-16 DIAGNOSIS — E118 Type 2 diabetes mellitus with unspecified complications: Secondary | ICD-10-CM

## 2023-03-16 DIAGNOSIS — E1169 Type 2 diabetes mellitus with other specified complication: Secondary | ICD-10-CM

## 2023-03-16 MED ORDER — ONDANSETRON HCL 4 MG PO TABS: 4.0000 mg | ORAL_TABLET | Freq: Every day | ORAL | 1 refills | Status: DC | PRN

## 2023-03-16 MED ORDER — VITAMIN D (ERGOCALCIFEROL) 1.25 MG (50000 UNIT) PO CAPS: 50000.0000 [IU] | ORAL_CAPSULE | ORAL | 3 refills | Status: DC

## 2023-03-16 MED ORDER — ATORVASTATIN CALCIUM 10 MG PO TABS: 10.0000 mg | ORAL_TABLET | Freq: Every day | ORAL | 1 refills | Status: DC

## 2023-03-16 NOTE — Assessment & Plan Note (Addendum)
LDL is  Lab Results  Component Value Date   LDLCALC 107 (H) 11/14/2022    Current regimen is atorvastatin started last visit.  Zetia was stopped. Tolerating medications well without issues.

## 2023-03-16 NOTE — Progress Notes (Signed)
Date:  03/16/2023   Name:  Alison Warren   DOB:  Nov 27, 1945   MRN:  [id number]   Chief Complaint: Diabetes and Hypertension  Diabetes She presents for her follow-up diabetic visit. She has type 2 diabetes mellitus. Her disease course has been stable. Hypoglycemia symptoms include headaches. Pertinent negatives for hypoglycemia include no nervousness/anxiousness or tremors. Pertinent negatives for diabetes include no chest pain, no fatigue, no polydipsia and no polyuria.  Hypertension Associated symptoms include headaches. Pertinent negatives include no chest pain, palpitations or shortness of breath.  Headache  This is a new problem. The current episode started in the past 7 days. The problem occurs intermittently. The problem has been gradually improving. The pain is located in the Right unilateral and temporal region. The pain does not radiate. The pain quality is similar to prior headaches. Associated symptoms include nausea. Pertinent negatives include no abdominal pain, coughing, fever, numbness, tinnitus or vomiting. Nothing aggravates the symptoms. She has tried NSAIDs for the symptoms. The treatment provided moderate relief. Her past medical history is significant for hypertension.    Review of Systems  Constitutional:  Negative for appetite change, fatigue, fever and unexpected weight change.  HENT:  Negative for tinnitus and trouble swallowing.   Eyes:  Positive for visual disturbance (unchanged).  Respiratory:  Negative for cough, chest tightness and shortness of breath.   Cardiovascular:  Negative for chest pain, palpitations and leg swelling.  Gastrointestinal:  Positive for nausea. Negative for abdominal pain and vomiting.  Endocrine: Negative for polydipsia and polyuria.  Genitourinary:  Negative for dysuria and hematuria.  Musculoskeletal:  Positive for arthralgias and gait problem.  Neurological:  Positive for headaches. Negative for tremors and numbness.   Psychiatric/Behavioral:  Negative for dysphoric mood and sleep disturbance. The patient is not nervous/anxious.      Lab Results  Component Value Date   NA 138 11/14/2022   K 4.7 11/14/2022   CO2 25 11/14/2022   GLUCOSE 153 (H) 11/14/2022   BUN 8 11/14/2022   CREATININE 0.76 11/14/2022   CALCIUM 10.4 (H) 11/14/2022   EGFR 80 11/14/2022   GFRNONAA >60 07/03/2021   Lab Results  Component Value Date   CHOL 196 11/14/2022   HDL 50 11/14/2022   LDLCALC 107 (H) 11/14/2022   TRIG 229 (H) 11/14/2022   CHOLHDL 3.9 11/14/2022   No results found for: "TSH" Lab Results  Component Value Date   HGBA1C 7.6 (H) 11/14/2022   Lab Results  Component Value Date   WBC 6.5 11/14/2022   HGB 12.9 11/14/2022   HCT 41.1 11/14/2022   MCV 83 11/14/2022   PLT 217 11/14/2022   Lab Results  Component Value Date   ALT 32 11/14/2022   AST 44 (H) 11/14/2022   ALKPHOS 112 11/14/2022   BILITOT 0.4 11/14/2022   Lab Results  Component Value Date   VD25OH 40.6 07/26/2022     Patient Active Problem List   Diagnosis Date Noted   Pruritic disorder 07/26/2022   Cirrhosis of liver without ascites, unspecified hepatic cirrhosis type (HCC) 07/05/2021   Benign essential hypertension 12/20/2020   Degenerative disc disease, cervical 03/23/2020   Chronic bilateral low back pain with bilateral sciatica 02/12/2020   Lumbar spondylosis with myelopathy 02/09/2020   Aortic atherosclerosis (HCC) 01/19/2020   GERD (gastroesophageal reflux disease) 11/21/2018   Hyperlipidemia associated with type 2 diabetes mellitus (HCC) 11/21/2018   Primary osteoarthritis of left knee 05/09/2018   B12 deficiency 04/30/2018   Obesity (BMI  30.0-34.9) 09/14/2017   Hepatic steatosis 02/26/2017   Diverticulitis of colon 09/22/2016   Vitamin D deficiency 09/01/2016   Type II diabetes mellitus with complication (HCC) 08/24/2016   Hx of adenomatous colonic polyps 02/24/2016   NAFLD (nonalcoholic fatty liver disease) 65/78/4696    Osteopenia of multiple sites 09/09/2015   Seasonal allergic rhinitis due to pollen 08/24/2015   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/2015   Abnormal mammogram with microcalcification 07/02/2014   Muscle cramp, nocturnal 08/20/2013   Hearing loss of both ears 08/19/2013    Allergies  Allergen Reactions   Duloxetine Nausea And Vomiting   Tramadol Nausea And Vomiting   Motrin [Ibuprofen] Rash    Past Surgical History:  Procedure Laterality Date   CATARACT EXTRACTION W/PHACO Right 12/07/2014   Procedure: CATARACT EXTRACTION PHACO AND INTRAOCULAR LENS PLACEMENT (IOC);  Surgeon: Sherald Hess, MD;  Location: [facility];  Service: Ophthalmology;  Laterality: Right;  DIABETIC - oral meds   CHOLECYSTECTOMY  2007   COLONOSCOPY N/A 02/08/2016   Procedure: COLONOSCOPY;  Surgeon: Christena Deem, MD;  Location: [facility];  Service: Endoscopy;  Laterality: N/A;  Diabetes   MASTOIDECTOMY     THUMB AMPUTATION     removal of 2nd thumb   TONSILLECTOMY      Social History   Tobacco Use   Smoking status: Never   Smokeless tobacco: Never  Vaping Use   Vaping status: Never Used  Substance Use Topics   Alcohol use: No   Drug use: No     Medication list has been reviewed and updated.  Current Meds  Medication Sig   acetaminophen (TYLENOL) 500 MG tablet Take 500 mg by mouth every 6 (six) hours as needed for moderate pain, fever or headache.    amLODipine (NORVASC) 5 MG tablet TAKE 1 TABLET (5 MG TOTAL) BY MOUTH DAILY.   aspirin 81 MG tablet Take 81 mg by mouth daily. AM   carvedilol (COREG) 3.125 MG tablet TAKE 1 TABLET BY MOUTH TWICE A DAY WITH FOOD   clobetasol ointment (TEMOVATE) 0.05 % Apply 1 Application topically 2 (two) times daily.   glucose blood (ONETOUCH ULTRA) test strip Use once daily   Lancets (ONETOUCH DELICA PLUS LANCET33G) MISC 1 EACH BY DOES NOT APPLY ROUTE SEE ADMIN INSTRUCTIONS.   metFORMIN (GLUCOPHAGE-XR) 500 MG 24 hr tablet TAKE 2 TABLETS  (1,000 MG TOTAL) BY MOUTH IN THE MORNING AND AT BEDTIME.   omeprazole (PRILOSEC) 40 MG capsule TAKE 1 CAPSULE (40 MG TOTAL) BY MOUTH DAILY.   [DISCONTINUED] atorvastatin (LIPITOR) 10 MG tablet Take 1 tablet (10 mg total) by mouth daily.   [DISCONTINUED] Vitamin D, Ergocalciferol, (DRISDOL) 1.25 MG (50000 UNIT) CAPS capsule Take 1 capsule (50,000 Units total) by mouth every 7 (seven) days.       11/14/2022    8:22 AM 03/24/2022    1:29 PM 11/23/2021    1:25 PM 09/21/2021    2:35 PM  GAD 7 : Generalized Anxiety Score  Nervous, Anxious, on Edge 0 0 0 0  Control/stop worrying 0 0 0 0  Worry too much - different things 0 0 0 0  Trouble relaxing 0 0 0 0  Restless 0 0 0 0  Easily annoyed or irritable 0 0 0 0  Afraid - awful might happen 0 0 0 0  Total GAD 7 Score 0 0 0 0  Anxiety Difficulty Not difficult at all Not difficult at all Not difficult at all Not difficult at all  11/14/2022    8:22 AM 03/24/2022    1:29 PM 02/22/2022    2:46 PM  Depression screen PHQ 2/9  Decreased Interest 1 0 0  Down, Depressed, Hopeless 0 0 0  PHQ - 2 Score 1 0 0  Altered sleeping 1 0 0  Tired, decreased energy 1 0 0  Change in appetite 0 0 0  Feeling bad or failure about yourself  0 0 0  Trouble concentrating 0 0 0  Moving slowly or fidgety/restless 0 0 0  Suicidal thoughts 0 0 0  PHQ-9 Score 3 0 0  Difficult doing work/chores Not difficult at all Not difficult at all Not difficult at all    BP Readings from Last 3 Encounters:  03/16/23 112/70  01/30/23 138/86  12/23/22 130/75    Physical Exam Vitals and nursing note reviewed.  Constitutional:      General: She is not in acute distress.    Appearance: Normal appearance. She is well-developed.  HENT:     Head: Normocephalic and atraumatic.     Jaw: There is normal jaw occlusion.     Nose:     Right Sinus: No maxillary sinus tenderness or frontal sinus tenderness.     Left Sinus: No maxillary sinus tenderness or frontal sinus  tenderness.  Cardiovascular:     Rate and Rhythm: Normal rate and regular rhythm.     Heart sounds: No murmur heard. Pulmonary:     Effort: Pulmonary effort is normal. No respiratory distress.     Breath sounds: Normal breath sounds. No decreased breath sounds, wheezing or rhonchi.  Musculoskeletal:     Cervical back: Neck supple. Pain with movement present. No spinous process tenderness. Decreased range of motion.     Right lower leg: No edema.     Left lower leg: No edema.  Skin:    General: Skin is warm and dry.     Findings: No rash.  Neurological:     Mental Status: She is alert and oriented to person, place, and time.  Psychiatric:        Attention and Perception: Attention normal.        Mood and Affect: Mood normal.        Behavior: Behavior normal.     Wt Readings from Last 3 Encounters:  03/16/23 156 lb (70.8 kg)  01/30/23 158 lb (71.7 kg)  12/23/22 160 lb 15 oz (73 kg)    BP 112/70   Pulse 71   Ht 5\' 4"  (1.626 m)   Wt 156 lb (70.8 kg)   SpO2 96%   BMI 26.78 kg/m   Assessment and Plan:  Problem List Items Addressed This Visit       Unprioritized   Type II diabetes mellitus with complication (HCC) (Chronic)    Blood sugars stable without hypoglycemic symptoms or events. Currently managed with metformin. Changes made last visit are none. Lab Results  Component Value Date   HGBA1C 7.6 (H) 11/14/2022         Relevant Medications   atorvastatin (LIPITOR) 10 MG tablet   Other Relevant Orders   Comprehensive metabolic panel   Hemoglobin A1c   Hyperlipidemia associated with type 2 diabetes mellitus (HCC) - Primary (Chronic)    LDL is  Lab Results  Component Value Date   LDLCALC 107 (H) 11/14/2022    Current regimen is atorvastatin started last visit.  Zetia was stopped. Tolerating medications well without issues.       Relevant Medications  atorvastatin (LIPITOR) 10 MG tablet   Other Relevant Orders   Comprehensive metabolic panel   Lipid  panel   Vitamin D deficiency (Chronic)   Relevant Medications   Vitamin D, Ergocalciferol, (DRISDOL) 1.25 MG (50000 UNIT) CAPS capsule   Other Visit Diagnoses     Right sided temporal headache       Relevant Orders   Sedimentation rate   Long term current use of oral hypoglycemic drug           Return in about 4 months (around 07/15/2023) for Medicare annual.    Reubin Milan, MD [facility] Primary Care and Sports Medicine [place]

## 2023-03-16 NOTE — Patient Instructions (Signed)
Use Advil and a warm compress to your right temple as need for headache.

## 2023-03-16 NOTE — Assessment & Plan Note (Signed)
Blood sugars stable without hypoglycemic symptoms or events. Currently managed with metformin. Changes made last visit are none. Lab Results  Component Value Date   HGBA1C 7.6 (H) 11/14/2022

## 2023-03-17 LAB — COMPREHENSIVE METABOLIC PANEL
ALT: 34 [IU]/L — ABNORMAL HIGH (ref 0–32)
AST: 46 [IU]/L — ABNORMAL HIGH (ref 0–40)
Albumin: 4.3 g/dL (ref 3.8–4.8)
Alkaline Phosphatase: 98 [IU]/L (ref 44–121)
BUN/Creatinine Ratio: 14 (ref 12–28)
BUN: 12 mg/dL (ref 8–27)
Bilirubin Total: 0.6 mg/dL (ref 0.0–1.2)
CO2: 22 mmol/L (ref 20–29)
Calcium: 10.1 mg/dL (ref 8.7–10.3)
Chloride: 99 mmol/L (ref 96–106)
Creatinine, Ser: 0.84 mg/dL (ref 0.57–1.00)
Globulin, Total: 3 g/dL (ref 1.5–4.5)
Glucose: 108 mg/dL — ABNORMAL HIGH (ref 70–99)
Potassium: 4.5 mmol/L (ref 3.5–5.2)
Sodium: 138 mmol/L (ref 134–144)
Total Protein: 7.3 g/dL (ref 6.0–8.5)
eGFR: 71 mL/min/{1.73_m2} (ref >59)

## 2023-03-17 LAB — LIPID PANEL
Chol/HDL Ratio: 2.7 {ratio} (ref 0.0–4.4)
Cholesterol, Total: 157 mg/dL (ref 100–199)
HDL: 59 mg/dL (ref >39)
LDL Chol Calc (NIH): 73 mg/dL (ref 0–99)
Triglycerides: 147 mg/dL (ref 0–149)
VLDL Cholesterol Cal: 25 mg/dL (ref 5–40)

## 2023-03-17 LAB — HEMOGLOBIN A1C
Est. average glucose Bld gHb Est-mCnc: 177 mg/dL
Hgb A1c MFr Bld: 7.8 % — ABNORMAL HIGH (ref 4.8–5.6)

## 2023-03-17 LAB — SEDIMENTATION RATE: Sed Rate: 36 mm/h (ref 0–40)

## 2023-05-03 ENCOUNTER — Encounter: Payer: Self-pay | Admitting: Family Medicine

## 2023-05-03 ENCOUNTER — Other Ambulatory Visit (INDEPENDENT_AMBULATORY_CARE_PROVIDER_SITE_OTHER): Payer: Medicare Other | Admitting: Radiology

## 2023-05-03 ENCOUNTER — Ambulatory Visit: Payer: Medicare Other | Admitting: Family Medicine

## 2023-05-03 ENCOUNTER — Telehealth: Payer: Self-pay | Admitting: Family Medicine

## 2023-05-03 VITALS — BP 118/78 | HR 87 | Ht 64.0 in | Wt 159.0 lb

## 2023-05-03 DIAGNOSIS — M1712 Unilateral primary osteoarthritis, left knee: Secondary | ICD-10-CM | POA: Diagnosis not present

## 2023-05-03 MED ORDER — TRIAMCINOLONE ACETONIDE 40 MG/ML IJ SUSP
40.0000 mg | Freq: Once | INTRAMUSCULAR | Status: AC
  Administered 2023-05-03: 40 mg via INTRAMUSCULAR

## 2023-05-03 NOTE — Assessment & Plan Note (Signed)
History of Present Illness The patient, with a history of left knee pain, reports that the knee is 'doing better' since the cortisone injection three months ago. However, she still experiences pain, particularly with increased activity such as walking or grocery shopping. She also reports feeling unsteady at times, needing to hold onto a shopping cart for support. The patient has not noticed a consistent pattern to these episodes of instability, stating they 'come and go.' Despite the ongoing discomfort, the patient reports improved sleep and overall satisfaction with the treatment.  Assessment and Plan Left Knee Osteoarthritis Persistent pain and subjective instability, particularly with prolonged walking. Improvement noted since last cortisone injection 3 months ago. Discussed the option of a gel injection for longer-lasting relief. -Administer cortisone injection today under ultrasound guidance. -Seek authorization for future viscosupplementation.

## 2023-05-03 NOTE — Progress Notes (Signed)
     Primary Care / Sports Medicine Office Visit  Patient Information:  Patient ID: JENNALEE AMON, female DOB: 12-01-1945 Age: 78 y.o. MRN: [id number]   KERAH NEUGENT is a pleasant 78 y.o. female presenting with the following:  Chief Complaint  Patient presents with   Knee Pain    Left knee pain has gotten better since the corticosteroid injection. She would like another injection today since the last one helped so much.    Vitals:   05/03/23 1336  BP: 118/78  Pulse: 87  SpO2: 97%   Vitals:   05/03/23 1336  Weight: 159 lb (72.1 kg)  Height: 5\' 4"  (1.626 m)   Body mass index is 27.29 kg/m.  No results found.   Independent interpretation of notes and tests performed by another provider:   None  Procedures performed:   Procedure:  Injection of left knee under ultrasound guidance. Ultrasound guidance utilized for anteromedial approach, joint lines visualized Samsung HS60 device utilized with permanent recording / reporting. Verbal informed consent obtained and verified. Skin prepped in a sterile fashion. Ethyl chloride for topical local analgesia.  Completed without difficulty and tolerated well. Medication: triamcinolone acetonide 40 mg/mL suspension for injection 1 mL total and 2 mL lidocaine 1% without epinephrine utilized for needle placement anesthetic Advised to contact for fevers/chills, erythema, induration, drainage, or persistent bleeding.   Pertinent History, Exam, Impression, and Recommendations:   Problem List Items Addressed This Visit     Primary osteoarthritis of left knee - Primary   History of Present Illness The patient, with a history of left knee pain, reports that the knee is 'doing better' since the cortisone injection three months ago. However, she still experiences pain, particularly with increased activity such as walking or grocery shopping. She also reports feeling unsteady at times, needing to hold onto a shopping cart for support. The  patient has not noticed a consistent pattern to these episodes of instability, stating they 'come and go.' Despite the ongoing discomfort, the patient reports improved sleep and overall satisfaction with the treatment.  Assessment and Plan Left Knee Osteoarthritis Persistent pain and subjective instability, particularly with prolonged walking. Improvement noted since last cortisone injection 3 months ago. Discussed the option of a gel injection for longer-lasting relief. -Administer cortisone injection today under ultrasound guidance. -Seek authorization for future viscosupplementation.      Relevant Medications   ibuprofen (ADVIL) 100 MG/5ML suspension   Other Relevant Orders   [place] LIMITED JOINT SPACE STRUCTURES LOW LEFT     Orders & Medications Medications:  Meds ordered this encounter  Medications   triamcinolone acetonide (KENALOG-40) injection 40 mg   Orders Placed This Encounter  Procedures   [place] LIMITED JOINT SPACE STRUCTURES LOW LEFT     Return if symptoms worsen or fail to improve.     Jerrol Banana, MD, [facility]   Primary Care Sports Medicine Primary Care and Sports Medicine at [facility]

## 2023-05-03 NOTE — Patient Instructions (Addendum)
You have just been given a cortisone injection to reduce pain and inflammation. After the injection you may notice immediate relief of pain as a result of the Lidocaine. It is important to rest the area of the injection for 24 to 48 hours after the injection. There is a possibility of some temporary increased discomfort and swelling for up to 72 hours until the cortisone begins to work. If you do have pain, simply rest the joint and use ice. If you can tolerate over the counter medications, you can try Tylenol, Aleve, or Advil for added relief per package instructions.  -We will contact you once we hear back from your insurance company about authorization regarding gel injection

## 2023-05-04 NOTE — Telephone Encounter (Signed)
Completed Benefits review on [employer] for Monovisc.  Case [id number]  Awaiting results.

## 2023-05-07 NOTE — Telephone Encounter (Signed)
Benefit review came back. Monovisc is not covered unless patient has tried and failed preferred alternatives. Preferred: Durolane, Gelsyn-3, Synvisc One.  Please advise which is appropriate for patient and I will try to complete a Prior Auth for it.  Thanks!

## 2023-05-08 ENCOUNTER — Telehealth: Payer: Self-pay | Admitting: Internal Medicine

## 2023-05-08 NOTE — Telephone Encounter (Signed)
Completed PA for Durolane. (KeyWilhemina Warren) PA Case ID #: [id number]  Awaiting outcome.

## 2023-05-08 NOTE — Telephone Encounter (Signed)
Spoke with [place] w/ monovisc she said if patient wants to proceed with the Monovosc injection it will cost $711 per inj. If patient would like to proceed with getting injection to call back to they can process order.   Spoke with Lelia and she said she will have to continue getting the cortisone injections at this time. The cost of the Monovisc is to  much for her.

## 2023-05-08 NOTE — Telephone Encounter (Signed)
Copied from CRM [id number]. Topic: General - Other >> May 08, 2023  9:57 AM Everette C wrote: Reason for CRM: Falysha with [employer] would like to speak with a member of staff regarding the patient's knee injections when possible   Please contact when possible at [phone] ext [phone]

## 2023-05-09 ENCOUNTER — Ambulatory Visit (INDEPENDENT_AMBULATORY_CARE_PROVIDER_SITE_OTHER): Payer: Medicare Other

## 2023-05-09 ENCOUNTER — Other Ambulatory Visit: Payer: Self-pay

## 2023-05-09 DIAGNOSIS — Z0000 Encounter for general adult medical examination without abnormal findings: Secondary | ICD-10-CM | POA: Diagnosis not present

## 2023-05-09 NOTE — Progress Notes (Cosign Needed)
Subjective:   Alison Warren is a 78 y.o. female who presents for Medicare Annual (Subsequent) preventive examination.  Visit Complete: Virtual I connected with  Alison Warren on 05/09/23 by a audio enabled telemedicine application and verified that I am speaking with the correct person using two identifiers.  This patient declined Interactive audio and Acupuncturist. Therefore the visit was completed with audio only.   Patient Location: Home  Provider Location: Office/Clinic  I discussed the limitations of evaluation and management by telemedicine. The patient expressed understanding and agreed to proceed.  Vital Signs: Because this visit was a virtual/telehealth visit, some criteria may be missing or patient reported. Any vitals not documented were not able to be obtained and vitals that have been documented are patient reported.  Cardiac Risk Factors include: advanced age (>19men, >90 women);dyslipidemia;diabetes mellitus;hypertension;sedentary lifestyle     Objective:    Today's Vitals   05/09/23 0852  PainSc: 3    There is no height or weight on file to calculate BMI.     05/09/2023    8:59 AM 12/23/2022    1:28 PM 07/07/2022    1:54 PM 03/18/2022    8:27 AM 02/22/2022    2:46 PM 09/13/2021   11:25 PM 03/21/2021    3:53 AM  Advanced Directives  Does Patient Have a Medical Advance Directive? No No No No No No No  Would patient like information on creating a medical advance directive? No - Patient declined    No - Patient declined No - Patient declined No - Patient declined    Current Medications (verified) Outpatient Encounter Medications as of 05/09/2023  Medication Sig   amLODipine (NORVASC) 5 MG tablet TAKE 1 TABLET (5 MG TOTAL) BY MOUTH DAILY.   aspirin 81 MG tablet Take 81 mg by mouth daily. AM   atorvastatin (LIPITOR) 10 MG tablet Take 1 tablet (10 mg total) by mouth daily.   carvedilol (COREG) 3.125 MG tablet TAKE 1 TABLET BY MOUTH TWICE A DAY WITH  FOOD   clobetasol ointment (TEMOVATE) 0.05 % Apply 1 Application topically 2 (two) times daily.   glucose blood (ONETOUCH ULTRA) test strip Use once daily   ibuprofen (ADVIL) 100 MG/5ML suspension Take 200 mg by mouth every 4 (four) hours as needed.   Lancets (ONETOUCH DELICA PLUS LANCET33G) MISC 1 EACH BY DOES NOT APPLY ROUTE SEE ADMIN INSTRUCTIONS.   metFORMIN (GLUCOPHAGE-XR) 500 MG 24 hr tablet TAKE 2 TABLETS (1,000 MG TOTAL) BY MOUTH IN THE MORNING AND AT BEDTIME.   omeprazole (PRILOSEC) 40 MG capsule TAKE 1 CAPSULE (40 MG TOTAL) BY MOUTH DAILY.   ondansetron (ZOFRAN) 4 MG tablet Take 1 tablet (4 mg total) by mouth daily as needed for nausea or vomiting.   Vitamin D, Ergocalciferol, (DRISDOL) 1.25 MG (50000 UNIT) CAPS capsule Take 1 capsule (50,000 Units total) by mouth every 7 (seven) days.   No facility-administered encounter medications on file as of 05/09/2023.    Allergies (verified) Duloxetine, Tramadol, and Motrin [ibuprofen]   History: Past Medical History:  Diagnosis Date   Arthritis    Bronchitis    none currently   Chronic cough    Costochondritis    Diabetes mellitus without complication (HCC)    8-9 years   Diverticulitis    Diverticulitis 08/2016   GERD (gastroesophageal reflux disease)    Hypercholesteremia    Hypertension    Hypomagnesemia 08/19/2014   Lactic acidosis 08/24/2016   Wears hearing aid    right ear  Past Surgical History:  Procedure Laterality Date   CATARACT EXTRACTION W/PHACO Right 12/07/2014   Procedure: CATARACT EXTRACTION PHACO AND INTRAOCULAR LENS PLACEMENT (IOC);  Surgeon: Sherald Hess, MD;  Location: [facility];  Service: Ophthalmology;  Laterality: Right;  DIABETIC - oral meds   CHOLECYSTECTOMY  2007   COLONOSCOPY N/A 02/08/2016   Procedure: COLONOSCOPY;  Surgeon: Christena Deem, MD;  Location: [facility];  Service: Endoscopy;  Laterality: N/A;  Diabetes   MASTOIDECTOMY     THUMB AMPUTATION     removal of  2nd thumb   TONSILLECTOMY     Family History  Problem Relation Age of Onset   Cancer Mother 56       Cervical   Heart attack Father    Diabetes Neg Hx    COPD Neg Hx    Social History   Socioeconomic History   Marital status: Widowed    Spouse name: Not on file   Number of children: 2   Years of education: Not on file   Highest education level: Not on file  Occupational History   Occupation: retired  Tobacco Use   Smoking status: Never   Smokeless tobacco: Never  Vaping Use   Vaping status: Never Used  Substance and Sexual Activity   Alcohol use: No   Drug use: No   Sexual activity: Not Currently  Other Topics Concern   Not on file  Social History Narrative   Pt lives alone   Social Drivers of Health   Financial Resource Strain: Low Risk  (05/09/2023)   Overall Financial Resource Strain (CARDIA)    Difficulty of Paying Living Expenses: Not hard at all  Food Insecurity: No Food Insecurity (05/09/2023)   Hunger Vital Sign    Worried About Running Out of Food in the Last Year: Never true    Ran Out of Food in the Last Year: Never true  Transportation Needs: No Transportation Needs (05/09/2023)   PRAPARE - Administrator, Civil Service (Medical): No    Lack of Transportation (Non-Medical): No  Physical Activity: Insufficiently Active (05/09/2023)   Exercise Vital Sign    Days of Exercise per Week: 2 days    Minutes of Exercise per Session: 30 min  Stress: No Stress Concern Present (02/22/2022)   [employer] of Occupational Health - Occupational Stress Questionnaire    Feeling of Stress : Not at all  Social Connections: Socially Isolated (05/09/2023)   Social Connection and Isolation Panel [NHANES]    Frequency of Communication with Friends and Family: Twice a week    Frequency of Social Gatherings with Friends and Family: Never    Attends Religious Services: Never    Database administrator or Organizations: No    Attends Banker  Meetings: Never    Marital Status: Widowed    Tobacco Counseling Counseling given: Not Answered   Clinical Intake:  Pre-visit preparation completed: Yes  Pain : 0-10 Pain Score: 3  Pain Type: Chronic pain Pain Location: Knee Pain Orientation: Left Pain Descriptors / Indicators: Aching, Burning, Constant Pain Onset: More than a month ago Pain Frequency: Constant     BMI - recorded: 27.3 Nutritional Status: BMI 25 -29 Overweight Nutritional Risks: None Diabetes: Yes CBG done?: No Did pt. bring in CBG monitor from home?: No  How often do you need to have someone help you when you read instructions, pamphlets, or other written materials from your doctor or pharmacy?: 1 - Never  Interpreter  Needed?: No  Information entered by :: Kennedy Bucker, LPN   Activities of Daily Living    05/09/2023    9:00 AM  In your present state of health, do you have any difficulty performing the following activities:  Hearing? 1  Vision? 1  Difficulty concentrating or making decisions? 0  Walking or climbing stairs? 0  Dressing or bathing? 0  Doing errands, shopping? 0  Preparing Food and eating ? N  Using the Toilet? N  In the past six months, have you accidently leaked urine? N  Do you have problems with loss of bowel control? N  Managing your Medications? N  Managing your Finances? N  Housekeeping or managing your Housekeeping? N    Patient Care Team: Reubin Milan, MD as PCP - General (Internal Medicine) Poggi, Excell Seltzer, MD as Consulting Physician (Orthopedic Surgery) Louellen Molder, NP as Nurse Practitioner (Gastroenterology) [place] Crane, MD as Consulting Physician (Ophthalmology)  Indicate any recent Medical Services you may have received from other than [facility] providers in the past year (date may be approximate).     Assessment:   This is a routine wellness examination for Alison Warren.  Hearing/Vision screen Hearing Screening - Comments:: WEARS AID ON  RIGHT EAR  Vision Screening - Comments:: NO GLASSES- [facility]   Goals Addressed             This Visit's Progress    DIET - EAT MORE FRUITS AND VEGETABLES         Depression Screen    05/09/2023    8:56 AM 11/14/2022    8:22 AM 03/24/2022    1:29 PM 02/22/2022    2:46 PM 11/23/2021    1:24 PM 09/21/2021    2:35 PM 07/20/2021    1:11 PM  PHQ 2/9 Scores  PHQ - 2 Score 0 1 0 0 0 0 0  PHQ- 9 Score 0 3 0 0 0 0 0    Fall Risk    05/09/2023    9:00 AM 11/14/2022    8:21 AM 03/24/2022    1:30 PM 02/22/2022    2:47 PM 11/23/2021    1:25 PM  Fall Risk   Falls in the past year? 0 0 0 0 0  Number falls in past yr: 0 0 0 0 0  Injury with Fall? 0 0 0 0 0  Risk for fall due to : No Fall Risks No Fall Risks History of fall(s) No Fall Risks No Fall Risks  Follow up Falls prevention discussed;Falls evaluation completed Falls evaluation completed Falls evaluation completed Falls evaluation completed Falls evaluation completed    MEDICARE RISK AT HOME: Medicare Risk at Home Any stairs in or around the home?: Yes If so, are there any without handrails?: No Home free of loose throw rugs in walkways, pet beds, electrical cords, etc?: Yes Adequate lighting in your home to reduce risk of falls?: Yes Life alert?: No Use of a cane, walker or w/c?: Yes (CANE ALL THE TIME) Grab bars in the bathroom?: Yes Shower chair or bench in shower?: Yes Elevated toilet seat or a handicapped toilet?: No  TIMED UP AND GO:  Was the test performed?  No    Cognitive Function:        05/09/2023    9:03 AM 02/22/2022    2:48 PM  6CIT Screen  What Year? 0 points 0 points  What month? 0 points 0 points  What time? 0 points 0 points  Count back  from 20 0 points 0 points  Months in reverse 0 points 0 points  Repeat phrase 2 points 4 points  Total Score 2 points 4 points    Immunizations Immunization History  Administered Date(s) Administered   Fluad Quad(high Dose 65+) 12/20/2020   Influenza  Inj Mdck Quad Pf 02/24/2016, 03/05/2019   Influenza Split 02/20/2014, 02/23/2015   Influenza, High Dose Seasonal PF 02/06/2017, 12/14/2017   Influenza-Unspecified 06/02/2003, 02/19/2013   Moderna Sars-Covid-2 Vaccination 08/18/2019, 09/15/2019   Pneumococcal Conjugate-13 02/24/2015   Pneumococcal Polysaccharide-23 01/09/2008, 09/15/2013    TDAP status: Due, Education has been provided regarding the importance of this vaccine. Advised may receive this vaccine at local pharmacy or Health Dept. Aware to provide a copy of the vaccination record if obtained from local pharmacy or Health Dept. Verbalized acceptance and understanding.  Flu Vaccine status: Declined, Education has been provided regarding the importance of this vaccine but patient still declined. Advised may receive this vaccine at local pharmacy or Health Dept. Aware to provide a copy of the vaccination record if obtained from local pharmacy or Health Dept. Verbalized acceptance and understanding.  Pneumococcal vaccine status: Declined,  Education has been provided regarding the importance of this vaccine but patient still declined. Advised may receive this vaccine at local pharmacy or Health Dept. Aware to provide a copy of the vaccination record if obtained from local pharmacy or Health Dept. Verbalized acceptance and understanding.   Covid-19 vaccine status: Completed vaccines  Qualifies for Shingles Vaccine? Yes   Zostavax completed No   Shingrix Completed?: No.    Education has been provided regarding the importance of this vaccine. Patient has been advised to call insurance company to determine out of pocket expense if they have not yet received this vaccine. Advised may also receive vaccine at local pharmacy or Health Dept. Verbalized acceptance and understanding.  Screening Tests Health Maintenance  Topic Date Due   DTaP/Tdap/Td (1 - Tdap) Never done   Zoster Vaccines- Shingrix (1 of 2) Never done   COVID-19 Vaccine (3 -  Moderna risk series) 10/13/2019   INFLUENZA VACCINE  07/09/2023 (Originally 11/09/2022)   HEMOGLOBIN A1C  09/14/2023   Diabetic kidney evaluation - Urine ACR  11/14/2023   FOOT EXAM  11/14/2023   Diabetic kidney evaluation - eGFR measurement  03/15/2024   Medicare Annual Wellness (AWV)  05/08/2024   Pneumonia Vaccine 63+ Years old  Completed   DEXA SCAN  Completed   Hepatitis C Screening  Completed   HPV VACCINES  Aged Out   OPHTHALMOLOGY EXAM  Discontinued   Colonoscopy  Discontinued    Health Maintenance  Health Maintenance Due  Topic Date Due   DTaP/Tdap/Td (1 - Tdap) Never done   Zoster Vaccines- Shingrix (1 of 2) Never done   COVID-19 Vaccine (3 - Moderna risk series) 10/13/2019    Colorectal cancer screening: No longer required.   Mammogram status: No longer required due to AGE.  DECLINED REFERRAL FOR BDS  Lung Cancer Screening: (Low Dose CT Chest recommended if Age 14-80 years, 20 pack-year currently smoking OR have quit w/in 15years.) does not qualify.    Additional Screening:  Hepatitis C Screening: does qualify; Completed 07/20/21  Vision Screening: Recommended annual ophthalmology exams for early detection of glaucoma and other disorders of the eye. Is the patient up to date with their annual eye exam?  Yes  Who is the provider or what is the name of the office in which the patient attends annual eye exams? [facility]  If pt is not established with a provider, would they like to be referred to a provider to establish care? No .   Dental Screening: Recommended annual dental exams for proper oral hygiene  Diabetic Foot Exam: Diabetic Foot Exam: Completed 11/14/22  Community Resource Referral / Chronic Care Management: CRR required this visit?  No   CCM required this visit?  No     Plan:     I have personally reviewed and noted the following in the patient's chart:   Medical and social history Use of alcohol, tobacco or illicit drugs  Current  medications and supplements including opioid prescriptions. Patient is not currently taking opioid prescriptions. Functional ability and status Nutritional status Physical activity Advanced directives List of other physicians Hospitalizations, surgeries, and ER visits in previous 12 months Vitals Screenings to include cognitive, depression, and falls Referrals and appointments  In addition, I have reviewed and discussed with patient certain preventive protocols, quality metrics, and best practice recommendations. A written personalized care plan for preventive services as well as general preventive health recommendations were provided to patient.     Hal Hope, LPN   1/61/0960   After Visit Summary: (MyChart) Due to this being a telephonic visit, the after visit summary with patients personalized plan was offered to patient via MyChart   Nurse Notes: NONE

## 2023-05-09 NOTE — Telephone Encounter (Signed)
Durolane is on plan's list of covered drugs. PA is not required per insurance. Will send RX to [facility] (F: [phone])

## 2023-05-09 NOTE — Patient Instructions (Addendum)
Alison Warren , Thank you for taking time to come for your Medicare Wellness Visit. I appreciate your ongoing commitment to your health goals. Please review the following plan we discussed and let me know if I can assist you in the future.   Referrals/Orders/Follow-Ups/Clinician Recommendations: NONE  This is a list of the screening recommended for you and due dates:  Health Maintenance  Topic Date Due   DTaP/Tdap/Td vaccine (1 - Tdap) Never done   Zoster (Shingles) Vaccine (1 of 2) Never done   COVID-19 Vaccine (3 - Moderna risk series) 10/13/2019   Flu Shot  07/09/2023*   Hemoglobin A1C  09/14/2023   Yearly kidney health urinalysis for diabetes  11/14/2023   Complete foot exam   11/14/2023   Yearly kidney function blood test for diabetes  03/15/2024   Medicare Annual Wellness Visit  05/08/2024   Pneumonia Vaccine  Completed   DEXA scan (bone density measurement)  Completed   Hepatitis C Screening  Completed   HPV Vaccine  Aged Out   Eye exam for diabetics  Discontinued   Colon Cancer Screening  Discontinued  *Topic was postponed. The date shown is not the original due date.    Advanced directives: (ACP Link)Information on Advanced Care Planning can be found at [facility] Advance Health Care Directives Advance Health Care Directives ([email or web address]   Next Medicare Annual Wellness Visit scheduled for next year: Yes   05/14/24 @ 8:10 AM BY PHONE

## 2023-06-25 ENCOUNTER — Other Ambulatory Visit (INDEPENDENT_AMBULATORY_CARE_PROVIDER_SITE_OTHER): Payer: Self-pay | Admitting: Radiology

## 2023-06-25 ENCOUNTER — Ambulatory Visit: Payer: Self-pay | Admitting: Internal Medicine

## 2023-06-25 ENCOUNTER — Ambulatory Visit (INDEPENDENT_AMBULATORY_CARE_PROVIDER_SITE_OTHER): Admitting: Family Medicine

## 2023-06-25 ENCOUNTER — Encounter: Payer: Self-pay | Admitting: Family Medicine

## 2023-06-25 VITALS — BP 118/76 | HR 82 | Ht 64.0 in

## 2023-06-25 DIAGNOSIS — M1712 Unilateral primary osteoarthritis, left knee: Secondary | ICD-10-CM

## 2023-06-25 DIAGNOSIS — S66091D Other specified injury of long flexor muscle, fascia and tendon of right thumb at wrist and hand level, subsequent encounter: Secondary | ICD-10-CM | POA: Insufficient documentation

## 2023-06-25 MED ORDER — TRIAMCINOLONE ACETONIDE 32 MG IX SRER
32.0000 mg | Freq: Once | INTRA_ARTICULAR | Status: AC
  Administered 2023-06-25: 32 mg via INTRA_ARTICULAR

## 2023-06-25 NOTE — Assessment & Plan Note (Signed)
 History of Present Illness Alison Warren is a 78 year old female with chronic left knee pain who presents for evaluation of persistent symptoms.  She has been experiencing persistent left knee pain in the setting of osteoarthritis. The pain has been ongoing, with a significant increase in intensity about a month ago.  Of note, she received intra-articular corticosteroid injection on 05/03/2023 which provided positive, though short-lived response.  Her prior injection from 01/30/2023 provided significant positive response for 3 months.  The pain is described as severe, similar to a 'toothache', and is localized about the inner/medial knee, radiating down "between the bones of the knee ". It has never fully subsided, and she notes a sensation of something 'popping out'.  She has been using a knee compression sleeve, which provides some relief, but it is insufficient for her current level of pain. The knee feels inflamed and sore.  She has a history of diabetes, which is relevant to her treatment options.  Assessment and Plan Left knee osteoarthritis Chronic left knee pain in the setting of osteoarthritis. Zilretta chosen for minimal impact on blood glucose and limited response from triamcinolone.  Can consider nerve-targeting procedure if ineffective. - Administered Zilretta injection under ultrasound guidance today. - Order Durolane gel injection pending insurance approval. - Provide hinged knee brace to be used at all times while ambulatory. - Schedule follow-up in 3 months, can coordinate sooner visit pending our office obtaining Durolane for patient.

## 2023-06-25 NOTE — Patient Instructions (Addendum)
 You have just been given a Zilretta injection to reduce pain and inflammation. After the injection you may notice immediate relief of pain as a result of the Lidocaine. It is important to rest the area of the injection for 24 to 48 hours after the injection. There is a possibility of some temporary increased discomfort and swelling for up to 72 hours until the cortisone begins to work. If you do have pain, simply rest the joint and use ice. If you can tolerate over the counter medications, you can try Tylenol, Aleve, or Advil for added relief per package instructions.  Patient Care Plan  Left Knee Osteoarthritis  1. You received a Zilretta injection today to help manage your knee pain. 2. Use a hinged knee brace at all times while walking or moving around. 3. We are working on getting approval for a Durolane gel injection; we will update you once we have more information. 4. Schedule a follow-up appointment in 3 months. If we get the Durolane, we might schedule you sooner.  Red Flags to Watch For  - Contact our office if you experience increased pain, swelling, or any new symptoms in your knee.  Please follow these steps and feel free to reach out if you have any questions or concerns regarding your treatment plan.

## 2023-06-25 NOTE — Telephone Encounter (Signed)
Noted  Pt has an appt  KP 

## 2023-06-25 NOTE — Progress Notes (Signed)
 Primary Care / Sports Medicine Office Visit  Patient Information:  Patient ID: Alison Warren, female DOB: 03-02-1946 Age: 78 y.o. MRN: [id number]   Alison Warren is a pleasant 78 y.o. female presenting with the following:  Chief Complaint  Patient presents with   Knee Pain    Left knee pain has returned since patients cortisone injection on 05/03/23. She has severe sharp stabbing pain constantly. Her pain has got significantly worse. She states the cortisone injections are not working anymore. She ambulants with a cane today. Pain is a 10/10 today.     Vitals:   06/25/23 1424  BP: 118/76  Pulse: 82  SpO2: 96%   Vitals:   06/25/23 1424  Height: 5\' 4"  (1.626 m)   Body mass index is 27.29 kg/m.  No results found.   Independent interpretation of notes and tests performed by another provider:   None  Procedures performed:   Procedure:  Injection of left knee under ultrasound guidance. Ultrasound guidance utilized for anteromedial approach, out of plane, increased vascularization noted on power Doppler. Samsung HS60 device utilized with permanent recording / reporting. Verbal informed consent obtained and verified. Skin prepped in a sterile fashion. Ethyl chloride for topical local analgesia.  Completed without difficulty and tolerated well. Medication: triamcinolone acetonide extended release injectable suspension 32 mg for injection and 2.5 mL lidocaine 1% without epinephrine utilized for needle placement anesthetic Advised to contact for fevers/chills, erythema, induration, drainage, or persistent bleeding.   Pertinent History, Exam, Impression, and Recommendations:   Problem List Items Addressed This Visit     Primary osteoarthritis of left knee - Primary   History of Present Illness Alison Warren is a 78 year old female with chronic left knee pain who presents for evaluation of persistent symptoms.  She has been experiencing persistent left knee pain in  the setting of osteoarthritis. The pain has been ongoing, with a significant increase in intensity about a month ago.  Of note, she received intra-articular corticosteroid injection on 05/03/2023 which provided positive, though short-lived response.  Her prior injection from 01/30/2023 provided significant positive response for 3 months.  The pain is described as severe, similar to a 'toothache', and is localized about the inner/medial knee, radiating down "between the bones of the knee ". It has never fully subsided, and she notes a sensation of something 'popping out'.  She has been using a knee compression sleeve, which provides some relief, but it is insufficient for her current level of pain. The knee feels inflamed and sore.  She has a history of diabetes, which is relevant to her treatment options.  Assessment and Plan Left knee osteoarthritis Chronic left knee pain in the setting of osteoarthritis. Zilretta chosen for minimal impact on blood glucose and limited response from triamcinolone.  Can consider nerve-targeting procedure if ineffective. - Administered Zilretta injection under ultrasound guidance today. - Order Durolane gel injection pending insurance approval. - Provide hinged knee brace to be used at all times while ambulatory. - Schedule follow-up in 3 months, can coordinate sooner visit pending our office obtaining Durolane for patient.      Relevant Orders   [place] LIMITED JOINT SPACE STRUCTURES LOW LEFT     Orders & Medications Medications:  Meds ordered this encounter  Medications   Triamcinolone Acetonide (ZILRETTA) intra-articular injection 32 mg   Orders Placed This Encounter  Procedures   [place] LIMITED JOINT SPACE STRUCTURES LOW LEFT     No follow-ups on file.  Jerrol Banana, MD, [facility]   Primary Care Sports Medicine Primary Care and Sports Medicine at [facility]

## 2023-06-25 NOTE — Telephone Encounter (Signed)
  Chief Complaint: knee pain Symptoms: pain in left knee Frequency: 9/10  Pertinent Negatives: Patient denies swelling or redness Disposition: [] ED /[] Urgent Care (no appt availability in office) / [x] Appointment(In office/virtual)/ []  [facility]/ [] Home Care/ [] Refused Recommended Disposition /[] [facility]/ []  Follow-up with PCP Additional Notes: Patient states that she has been having pain in the left knee for a few months and it is getting more serve.  Patient states that pain is 9/10 today and she is unable to walk on that knee.  Denies swelling or redness.  Taking tylenol but doesn't help with the pain.   Copied from CRM [phone] [id number]. Topic: Clinical - Red Word Triage >> Jun 25, 2023 10:40 AM Gildardo Pounds wrote: Red Word that prompted transfer to Nurse Triage: Patient wants an appointment with Joseph Berkshire for left knee hurting; hard to walk; pain level 10. callback number is [phone] Reason for Disposition  [1] MODERATE pain (e.g., interferes with normal activities, limping) AND [2] present > 3 days  Answer Assessment - Initial Assessment Questions 1. LOCATION and RADIATION: "Where is the pain located?"      Left knee 2. QUALITY: "What does the pain feel like?"  (e.g., sharp, dull, aching, burning)     Sharp and aching 3. SEVERITY: "How bad is the pain?" "What does it keep you from doing?"   (Scale 1-10; or mild, moderate, severe)   -  MILD (1-3): doesn't interfere with normal activities    -  MODERATE (4-7): interferes with normal activities (e.g., work or school) or awakens from sleep, limping    -  SEVERE (8-10): excruciating pain, unable to do any normal activities, unable to walk     9/10 4. ONSET: "When did the pain start?" "Does it come and go, or is it there all the time?"     Months ago 5. RECURRENT: "Have you had this pain before?" If Yes, ask: "When, and what happened then?"     Yes, ongoing 6. SETTING: "Has there been any recent work, exercise or  other activity that involved that part of the body?"      no 7. AGGRAVATING FACTORS: "What makes the knee pain worse?" (e.g., walking, climbing stairs, running)     walking 8. ASSOCIATED SYMPTOMS: "Is there any swelling or redness of the knee?"     no 9. OTHER SYMPTOMS: "Do you have any other symptoms?" (e.g., chest pain, difficulty breathing, fever, calf pain)     no  Protocols used: Knee Pain-A-AH

## 2023-06-28 ENCOUNTER — Other Ambulatory Visit: Payer: Self-pay | Admitting: Family Medicine

## 2023-06-28 ENCOUNTER — Telehealth: Payer: Self-pay | Admitting: Family Medicine

## 2023-06-28 DIAGNOSIS — M1712 Unilateral primary osteoarthritis, left knee: Secondary | ICD-10-CM

## 2023-06-29 ENCOUNTER — Ambulatory Visit: Admitting: Family Medicine

## 2023-06-29 ENCOUNTER — Ambulatory Visit
Admission: RE | Admit: 2023-06-29 | Discharge: 2023-06-29 | Disposition: A | Discharge status: Home / Self Care | Attending: Family Medicine | Admitting: Family Medicine

## 2023-06-29 ENCOUNTER — Ambulatory Visit
Admission: RE | Admit: 2023-06-29 | Discharge: 2023-06-29 | Disposition: A | Discharge status: Home / Self Care | Source: Ambulatory Visit | Attending: Family Medicine | Admitting: Family Medicine

## 2023-06-29 ENCOUNTER — Telehealth: Payer: Self-pay | Admitting: Internal Medicine

## 2023-06-29 ENCOUNTER — Encounter: Payer: Self-pay | Admitting: Family Medicine

## 2023-06-29 VITALS — BP 130/82 | HR 74 | Ht 60.0 in | Wt 155.4 lb

## 2023-06-29 DIAGNOSIS — M1712 Unilateral primary osteoarthritis, left knee: Secondary | ICD-10-CM

## 2023-06-29 DIAGNOSIS — M25562 Pain in left knee: Secondary | ICD-10-CM | POA: Diagnosis not present

## 2023-06-29 MED ORDER — DULOXETINE HCL 20 MG PO CPEP: 20.0000 mg | ORAL_CAPSULE | Freq: Every day | ORAL | 0 refills | Status: DC

## 2023-06-29 MED ORDER — TRAMADOL HCL 25 MG PO TABS: 1.0000 | ORAL_TABLET | Freq: Three times a day (TID) | ORAL | 0 refills | Status: DC | PRN

## 2023-06-29 NOTE — Progress Notes (Signed)
 Primary Care / Sports Medicine Office Visit  Patient Information:  Patient ID: Alison Warren, female DOB: April 11, 1945 Age: 78 y.o. MRN: [id number]   Alison Warren is a pleasant 78 y.o. female presenting with the following:  Chief Complaint  Patient presents with   Knee Pain    Patient returns today for her left knee that continues to hurt after injection on 06/25/23. She has obtained xray's for today's visit.     Vitals:   06/29/23 1329  BP: 130/82  Pulse: 74  SpO2: 95%   Vitals:   06/29/23 1329  Weight: 155 lb 6.4 oz (70.5 kg)  Height: 5' (1.524 m)   Body mass index is 30.35 kg/m.  [place] LIMITED JOINT SPACE STRUCTURES LOW LEFT Result Date: 06/25/2023 Procedure:  Injection of left knee under ultrasound guidance. Ultrasound guidance utilized for anteromedial approach, out of plane, increased vascularization noted on power Doppler. Samsung HS60 device utilized with permanent recording / reporting. Verbal informed consent obtained and verified. Skin prepped in a sterile fashion. Ethyl chloride for topical local analgesia. Completed without difficulty and tolerated well. Medication: triamcinolone acetonide extended release injectable suspension 32 mg for injection and 2.5 mL lidocaine 1% without epinephrine utilized for needle placement anesthetic Advised to contact for fevers/chills, erythema, induration, drainage, or persistent bleeding.     Independent interpretation of notes and tests performed by another provider:   See below  Procedures performed:   None  Pertinent History, Exam, Impression, and Recommendations:   Problem List Items Addressed This Visit     Primary osteoarthritis of left knee - Primary   History of Present Illness Alison Warren is a 78 year old female with severe knee arthritis who presents with persistent knee pain.  She experiences persistent knee pain due to severe arthritis, with significant degeneration visible on x-ray. A recent Zilretta  injection (performed on 3/17) provided modest relief, but did resolve the severe pain that she presented with that day and has continued to remain active. She has been trying to manage it by staying off the affected knee and propping it up.  She has a history of arthritis affecting multiple areas of her body, with the knee being particularly severe.  Her pain causes difficulty with mobility. She uses a cane for support.  She lives independently and manages her household tasks, although she expresses concern about her ability to continue doing so.  Results RADIOLOGY Knee X-ray: Severe deformity of medial tibiofemoral osteoarthritis with joint space narrowing and near ankylosis appearance of the medial knee joint  Assessment and Plan Severe osteoarthritis of the left knee Severe osteoarthritis with previous steroid injections providing minimal relief. She desires to maintain independence but is hindered by knee pain. Surgical intervention discussed, but she is not at all amenable to this plan, and given her medical comorbidities total knee arthroplasty may pose a significant risk. - Advise continued use of previously provided hinged knee brace to offload pressure. - Recommend rollator for support and mobility.  DME prescription provided today that she can take to a local medical supply site. - Prescribe tramadol to utilize for severe episodes, use sparingly. - Will start duloxetine 20 mg daily. - Return in 2 months for reevaluation - Can consider genicular nerve block at that time pending response to after mentioned treatments.      Relevant Medications   DULoxetine (CYMBALTA) 20 MG capsule   traMADol HCl 25 MG TABS   Other Relevant Orders   Ambulatory Referral for  DME     Orders & Medications Medications:  Meds ordered this encounter  Medications   DULoxetine (CYMBALTA) 20 MG capsule    Sig: Take 1 capsule (20 mg total) by mouth daily.    Dispense:  60 capsule    Refill:  0    traMADol HCl 25 MG TABS    Sig: Take 1 tablet by mouth every 8 (eight) hours as needed (severe pain).    Dispense:  10 tablet    Refill:  0   Orders Placed This Encounter  Procedures   Ambulatory Referral for DME     No follow-ups on file.     Jerrol Banana, MD, [facility]   Primary Care Sports Medicine Primary Care and Sports Medicine at [facility]

## 2023-06-29 NOTE — Patient Instructions (Signed)
 Patient Care Plan  Severe Osteoarthritis of the Left Knee  - Continue using the hinged knee brace to reduce pressure on the knee. - Use a rollator for support and improved mobility; a prescription for this has been provided. - Take tramadol only for severe pain episodes and use it sparingly. - Start taking duloxetine 20 mg once daily. - Schedule a follow-up appointment in 2 months for reevaluation. - Consider a genicular nerve block at the next visit if current treatments do not provide adequate relief.  Red Flags  - Contact your healthcare provider if pain significantly worsens or if you experience new symptoms affecting your mobility.

## 2023-06-29 NOTE — Telephone Encounter (Signed)
 Called pt left detailed message letting pt know to come in 1 hour before appt to get xray done.  KP

## 2023-06-29 NOTE — Assessment & Plan Note (Signed)
 History of Present Illness Alison Warren is a 78 year old female with severe knee arthritis who presents with persistent knee pain.  She experiences persistent knee pain due to severe arthritis, with significant degeneration visible on x-ray. A recent Zilretta injection (performed on 3/17) provided modest relief, but did resolve the severe pain that she presented with that day and has continued to remain active. She has been trying to manage it by staying off the affected knee and propping it up.  She has a history of arthritis affecting multiple areas of her body, with the knee being particularly severe.  Her pain causes difficulty with mobility. She uses a cane for support.  She lives independently and manages her household tasks, although she expresses concern about her ability to continue doing so.  Results RADIOLOGY Knee X-ray: Severe deformity of medial tibiofemoral osteoarthritis with joint space narrowing and near ankylosis appearance of the medial knee joint  Assessment and Plan Severe osteoarthritis of the left knee Severe osteoarthritis with previous steroid injections providing minimal relief. She desires to maintain independence but is hindered by knee pain. Surgical intervention discussed, but she is not at all amenable to this plan, and given her medical comorbidities total knee arthroplasty may pose a significant risk. - Advise continued use of previously provided hinged knee brace to offload pressure. - Recommend rollator for support and mobility.  DME prescription provided today that she can take to a local medical supply site. - Prescribe tramadol to utilize for severe episodes, use sparingly. - Will start duloxetine 20 mg daily. - Return in 2 months for reevaluation - Can consider genicular nerve block at that time pending response to after mentioned treatments.

## 2023-06-29 NOTE — Telephone Encounter (Signed)
 Finally got in touch with pt and explained to her to have xrays done before seeing Dr. Ashley Royalty today at 1:40. Explained that she needs to be there an hour prior to her appt with Dr. Ashley Royalty. Pt understood. FYI

## 2023-07-16 ENCOUNTER — Ambulatory Visit: Payer: Medicare Other | Admitting: Internal Medicine

## 2023-07-18 ENCOUNTER — Encounter: Payer: Self-pay | Admitting: Internal Medicine

## 2023-07-18 ENCOUNTER — Ambulatory Visit (INDEPENDENT_AMBULATORY_CARE_PROVIDER_SITE_OTHER): Payer: Self-pay | Admitting: Internal Medicine

## 2023-07-18 VITALS — BP 110/68 | HR 67 | Ht 60.0 in | Wt 159.0 lb

## 2023-07-18 DIAGNOSIS — Z7984 Long term (current) use of oral hypoglycemic drugs: Secondary | ICD-10-CM | POA: Diagnosis not present

## 2023-07-18 DIAGNOSIS — E1169 Type 2 diabetes mellitus with other specified complication: Secondary | ICD-10-CM | POA: Diagnosis not present

## 2023-07-18 DIAGNOSIS — K219 Gastro-esophageal reflux disease without esophagitis: Secondary | ICD-10-CM

## 2023-07-18 DIAGNOSIS — K7460 Unspecified cirrhosis of liver: Secondary | ICD-10-CM | POA: Diagnosis not present

## 2023-07-18 DIAGNOSIS — E785 Hyperlipidemia, unspecified: Secondary | ICD-10-CM

## 2023-07-18 DIAGNOSIS — E118 Type 2 diabetes mellitus with unspecified complications: Secondary | ICD-10-CM

## 2023-07-18 DIAGNOSIS — Z1231 Encounter for screening mammogram for malignant neoplasm of breast: Secondary | ICD-10-CM | POA: Diagnosis not present

## 2023-07-18 DIAGNOSIS — I10 Essential (primary) hypertension: Secondary | ICD-10-CM | POA: Diagnosis not present

## 2023-07-18 DIAGNOSIS — Z0000 Encounter for general adult medical examination without abnormal findings: Secondary | ICD-10-CM | POA: Diagnosis not present

## 2023-07-18 NOTE — Assessment & Plan Note (Signed)
 Blood pressure is well controlled.  Current medications are amlodipine and Coreg Will continue same regimen along with efforts to limit dietary sodium.

## 2023-07-18 NOTE — Assessment & Plan Note (Signed)
 Blood sugars have been stable.  No recent hypoglycemic events requiring assistance. Currently medications are metformin. Lab Results  Component Value Date   HGBA1C 7.8 (H) 03/16/2023   Last visit no changes were made.

## 2023-07-18 NOTE — Progress Notes (Signed)
 Date:  07/18/2023   Name:  Alison Warren   DOB:  [birth year]   MRN:  [id number]   Chief Complaint: Annual Exam Alison Warren is a 78 y.o. female who presents today for her Complete Annual Exam. She feels well. She reports exercising walks a little bit. She reports she is sleeping fairly well. Breast complaints none.  Health Maintenance  Topic Date Due   DTaP/Tdap/Td vaccine (1 - Tdap) Never done   Zoster (Shingles) Vaccine (1 of 2) Never done   COVID-19 Vaccine (3 - Moderna risk series) 08/03/2023*   Hemoglobin A1C  09/14/2023   Flu Shot  11/09/2023   Yearly kidney health urinalysis for diabetes  11/14/2023   Complete foot exam   11/14/2023   Yearly kidney function blood test for diabetes  03/15/2024   Medicare Annual Wellness Visit  05/08/2024   Pneumonia Vaccine  Completed   DEXA scan (bone density measurement)  Completed   Hepatitis C Screening  Completed   HPV Vaccine  Aged Out   Eye exam for diabetics  Discontinued   Colon Cancer Screening  Discontinued  *Topic was postponed. The date shown is not the original due date.    Hypertension This is a chronic problem. The problem is controlled. Pertinent negatives include no chest pain, headaches, palpitations or shortness of breath. Past treatments include beta blockers and calcium channel blockers. There is no history of kidney disease or CVA.  Diabetes She presents for her follow-up diabetic visit. She has type 2 diabetes mellitus. Pertinent negatives for hypoglycemia include no dizziness, headaches or nervousness/anxiousness. Pertinent negatives for diabetes include no chest pain, no fatigue and no weakness. Pertinent negatives for diabetic complications include no CVA. Current diabetic treatment includes oral agent (monotherapy) (MTF).  Hyperlipidemia This is a chronic problem. The problem is controlled. Pertinent negatives include no chest pain, myalgias or shortness of breath. Current antihyperlipidemic treatment includes  statins.  Gastroesophageal Reflux She complains of heartburn. She reports no abdominal pain, no chest pain, no choking, no coughing, no dysphagia or no wheezing. This is a recurrent problem. The problem occurs rarely. Pertinent negatives include no fatigue. She has tried a PPI for the symptoms.    Review of Systems  Constitutional:  Negative for fatigue and unexpected weight change.  HENT:  Positive for hearing loss. Negative for trouble swallowing.   Eyes:  Positive for visual disturbance.  Respiratory:  Negative for cough, choking, chest tightness, shortness of breath and wheezing.   Cardiovascular:  Negative for chest pain, palpitations and leg swelling.       Had some chest wall discomfort for a week after carrying in heavy groceries - now resolved after rest and tylenol.  Gastrointestinal:  Positive for heartburn. Negative for abdominal pain, constipation, diarrhea and dysphagia.       Noted small swelling in left groin yesterday - now resolved  Genitourinary:  Negative for dysuria and hematuria.  Musculoskeletal:  Positive for arthralgias, gait problem and joint swelling. Negative for myalgias.  Neurological:  Negative for dizziness, weakness, light-headedness and headaches.  Psychiatric/Behavioral:  Negative for dysphoric mood and sleep disturbance. The patient is not nervous/anxious.      Lab Results  Component Value Date   NA 138 03/16/2023   K 4.5 03/16/2023   CO2 22 03/16/2023   GLUCOSE 108 (H) 03/16/2023   BUN 12 03/16/2023   CREATININE 0.84 03/16/2023   CALCIUM 10.1 03/16/2023   EGFR 71 03/16/2023   GFRNONAA >60 07/03/2021  Lab Results  Component Value Date   CHOL 157 03/16/2023   HDL 59 03/16/2023   LDLCALC 73 03/16/2023   TRIG 147 03/16/2023   CHOLHDL 2.7 03/16/2023   No results found for: "TSH" Lab Results  Component Value Date   HGBA1C 7.8 (H) 03/16/2023   Lab Results  Component Value Date   WBC 6.5 11/14/2022   HGB 12.9 11/14/2022   HCT 41.1  11/14/2022   MCV 83 11/14/2022   PLT 217 11/14/2022   Lab Results  Component Value Date   ALT 34 (H) 03/16/2023   AST 46 (H) 03/16/2023   ALKPHOS 98 03/16/2023   BILITOT 0.6 03/16/2023   Lab Results  Component Value Date   VD25OH 40.6 07/26/2022     Patient Active Problem List   Diagnosis Date Noted   Other specified injury of long flexor muscle, fascia and tendon of right thumb at wrist and hand level, subsequent encounter 06/25/2023   Pruritic disorder 07/26/2022   Cirrhosis of liver without ascites, unspecified hepatic cirrhosis type (HCC) 07/05/2021   Benign essential hypertension 12/20/2020   Degenerative disc disease, cervical 03/23/2020   Chronic bilateral low back pain with bilateral sciatica 02/12/2020   Lumbar spondylosis with myelopathy 02/09/2020   Aortic atherosclerosis (HCC) 01/19/2020   GERD (gastroesophageal reflux disease) 11/21/2018   Hyperlipidemia associated with type 2 diabetes mellitus (HCC) 11/21/2018   Primary osteoarthritis of left knee 05/09/2018   B12 deficiency 04/30/2018   Obesity (BMI 30.0-34.9) 09/14/2017   Hepatic steatosis 02/26/2017   Diverticulitis of colon 09/22/2016   Vitamin D deficiency 09/01/2016   Type II diabetes mellitus with complication (HCC) 08/24/2016   Hx of adenomatous colonic polyps 02/24/2016   NAFLD (nonalcoholic fatty liver disease) 86/57/8469   Osteopenia of multiple sites 09/09/2015   Seasonal allergic rhinitis due to pollen 08/24/2015   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/2015   Abnormal mammogram with microcalcification 07/02/2014   Muscle cramp, nocturnal 08/20/2013   Hearing loss of both ears 08/19/2013   Blindness of left eye with normal vision in contralateral eye 05/14/2006    Allergies  Allergen Reactions   Duloxetine Nausea And Vomiting   Tramadol Nausea And Vomiting   Motrin [Ibuprofen] Rash    Past Surgical History:  Procedure Laterality Date   CATARACT EXTRACTION W/PHACO Right 12/07/2014    Procedure: CATARACT EXTRACTION PHACO AND INTRAOCULAR LENS PLACEMENT (IOC);  Surgeon: Sherald Hess, MD;  Location: [facility];  Service: Ophthalmology;  Laterality: Right;  DIABETIC - oral meds   CHOLECYSTECTOMY  2007   COLONOSCOPY N/A 02/08/2016   Procedure: COLONOSCOPY;  Surgeon: Christena Deem, MD;  Location: [facility]COPY;  Service: Endoscopy;  Laterality: N/A;  Diabetes   MASTOIDECTOMY     THUMB AMPUTATION     removal of 2nd thumb   TONSILLECTOMY      Social History   Tobacco Use   Smoking status: Never   Smokeless tobacco: Never  Vaping Use   Vaping status: Never Used  Substance Use Topics   Alcohol use: No   Drug use: No     Medication list has been reviewed and updated.  Current Meds  Medication Sig   amLODipine (NORVASC) 5 MG tablet TAKE 1 TABLET (5 MG TOTAL) BY MOUTH DAILY.   aspirin 81 MG tablet Take 81 mg by mouth daily. AM   atorvastatin (LIPITOR) 10 MG tablet Take 1 tablet (10 mg total) by mouth daily.   carvedilol (COREG) 3.125 MG tablet TAKE 1 TABLET BY MOUTH  TWICE A DAY WITH FOOD   ezetimibe (ZETIA) 10 MG tablet Take 10 mg by mouth daily.   glucose blood (ONETOUCH ULTRA) test strip Use once daily   Lancets (ONETOUCH DELICA PLUS LANCET33G) MISC 1 EACH BY DOES NOT APPLY ROUTE SEE ADMIN INSTRUCTIONS.   metFORMIN (GLUCOPHAGE-XR) 500 MG 24 hr tablet TAKE 2 TABLETS (1,000 MG TOTAL) BY MOUTH IN THE MORNING AND AT BEDTIME.   omeprazole (PRILOSEC) 40 MG capsule TAKE 1 CAPSULE (40 MG TOTAL) BY MOUTH DAILY.   Vitamin D, Ergocalciferol, (DRISDOL) 1.25 MG (50000 UNIT) CAPS capsule Take 1 capsule (50,000 Units total) by mouth every 7 (seven) days.       07/18/2023   10:17 AM 11/14/2022    8:22 AM 03/24/2022    1:29 PM 11/23/2021    1:25 PM  GAD 7 : Generalized Anxiety Score  Nervous, Anxious, on Edge 0 0 0 0  Control/stop worrying 0 0 0 0  Worry too much - different things 0 0 0 0  Trouble relaxing 0 0 0 0  Restless 0 0 0 0  Easily annoyed or  irritable 0 0 0 0  Afraid - awful might happen 0 0 0 0  Total GAD 7 Score 0 0 0 0  Anxiety Difficulty Not difficult at all Not difficult at all Not difficult at all Not difficult at all       07/18/2023   10:17 AM 05/09/2023    8:56 AM 11/14/2022    8:22 AM  Depression screen PHQ 2/9  Decreased Interest 0 0 1  Down, Depressed, Hopeless 0 0 0  PHQ - 2 Score 0 0 1  Altered sleeping 0 0 1  Tired, decreased energy 0 0 1  Change in appetite 0 0 0  Feeling bad or failure about yourself  0 0 0  Trouble concentrating 0 0 0  Moving slowly or fidgety/restless 0 0 0  Suicidal thoughts 0 0 0  PHQ-9 Score 0 0 3  Difficult doing work/chores Not difficult at all Not difficult at all Not difficult at all    BP Readings from Last 3 Encounters:  07/18/23 110/68  06/29/23 130/82  06/25/23 118/76    Physical Exam Vitals and nursing note reviewed.  Constitutional:      General: She is not in acute distress.    Appearance: She is well-developed.  HENT:     Head: Normocephalic and atraumatic.     Right Ear: Tympanic membrane and ear canal normal.     Left Ear: Tympanic membrane and ear canal normal.     Nose:     Right Sinus: No maxillary sinus tenderness.     Left Sinus: No maxillary sinus tenderness.  Eyes:     General: No scleral icterus.       Right eye: No discharge.        Left eye: No discharge.     Conjunctiva/sclera: Conjunctivae normal.  Neck:     Thyroid: No thyromegaly.     Vascular: No carotid bruit.  Cardiovascular:     Rate and Rhythm: Normal rate and regular rhythm.     Pulses: Normal pulses.     Heart sounds: Normal heart sounds.  Pulmonary:     Effort: Pulmonary effort is normal. No respiratory distress.     Breath sounds: No wheezing.  Chest:     Chest wall: No tenderness, crepitus or edema.  Abdominal:     General: Abdomen is protuberant. Bowel sounds are normal.  Palpations: Abdomen is soft. There is no hepatomegaly or splenomegaly.     Tenderness: There is  no abdominal tenderness. There is no guarding or rebound.     Hernia: No hernia is present.     Comments: No inguinal mass or hernia noted  Musculoskeletal:     Cervical back: Normal range of motion. No erythema.     Right lower leg: No edema.     Left lower leg: No edema.  Lymphadenopathy:     Cervical: No cervical adenopathy.  Skin:    General: Skin is warm and dry.     Findings: No rash.  Neurological:     Mental Status: She is alert and oriented to person, place, and time.     Cranial Nerves: No cranial nerve deficit.     Sensory: No sensory deficit.     Deep Tendon Reflexes: Reflexes are normal and symmetric.  Psychiatric:        Attention and Perception: Attention normal.        Mood and Affect: Mood normal.     Wt Readings from Last 3 Encounters:  07/18/23 159 lb (72.1 kg)  06/29/23 155 lb 6.4 oz (70.5 kg)  05/03/23 159 lb (72.1 kg)    BP 110/68   Pulse 67   Ht 5' (1.524 m)   Wt 159 lb (72.1 kg)   SpO2 98%   BMI 31.05 kg/m   Assessment and Plan:  Problem List Items Addressed This Visit       Unprioritized   Type II diabetes mellitus with complication (HCC) (Chronic)   Blood sugars have been stable.  No recent hypoglycemic events requiring assistance. Currently medications are metformin. Lab Results  Component Value Date   HGBA1C 7.8 (H) 03/16/2023   Last visit no changes were made.       Relevant Orders   Comprehensive metabolic panel with GFR   Hemoglobin A1c   Ambulatory referral to Podiatry   GERD (gastroesophageal reflux disease) (Chronic)   Reflux symptoms are controlled on omeprazole daily. Patient denies red flag symptoms - no melena, weight loss, dysphagia.       Hyperlipidemia associated with type 2 diabetes mellitus (HCC) (Chronic)   LDL is  Lab Results  Component Value Date   LDLCALC 73 03/16/2023   Current regimen is atorvastatin and Zetia.  No medication side effects noted. Goal LDL is <70.       Relevant Medications    ezetimibe (ZETIA) 10 MG tablet   Other Relevant Orders   Lipid panel   Benign essential hypertension (Chronic)   Blood pressure is well controlled.  Current medications are amlodipine and Coreg Will continue same regimen along with efforts to limit dietary sodium.       Relevant Medications   ezetimibe (ZETIA) 10 MG tablet   Other Relevant Orders   CBC with Differential/Platelet   Comprehensive metabolic panel with GFR   TSH   Microalbumin / creatinine urine ratio   Cirrhosis of liver without ascites, unspecified hepatic cirrhosis type (HCC) (Chronic)   Relevant Orders   Comprehensive metabolic panel with GFR   Other Visit Diagnoses       Annual physical exam    -  Primary   aged out of mammograms per her choice     Encounter for screening mammogram for breast cancer       no longer being ordered     Long term current use of oral hypoglycemic drug  Return in about 4 months (around 11/17/2023) for DM, HTN.    Reubin Milan, MD [facility] Primary Care and Sports Medicine [place]

## 2023-07-18 NOTE — Assessment & Plan Note (Addendum)
 LDL is  Lab Results  Component Value Date   LDLCALC 73 03/16/2023   Current regimen is atorvastatin and Zetia.  No medication side effects noted. Goal LDL is <70.

## 2023-07-18 NOTE — Assessment & Plan Note (Signed)
 Reflux symptoms are controlled on omeprazole daily. Patient denies red flag symptoms - no melena, weight loss, dysphagia.

## 2023-07-20 LAB — COMPREHENSIVE METABOLIC PANEL WITH GFR
ALT: 25 IU/L (ref 0–32)
AST: 29 IU/L (ref 0–40)
Albumin: 4 g/dL (ref 3.8–4.8)
Alkaline Phosphatase: 85 IU/L (ref 44–121)
BUN/Creatinine Ratio: 14 (ref 12–28)
BUN: 11 mg/dL (ref 8–27)
Bilirubin Total: 0.5 mg/dL (ref 0.0–1.2)
CO2: 23 mmol/L (ref 20–29)
Calcium: 10.2 mg/dL (ref 8.7–10.3)
Chloride: 102 mmol/L (ref 96–106)
Creatinine, Ser: 0.81 mg/dL (ref 0.57–1.00)
Globulin, Total: 2.9 g/dL (ref 1.5–4.5)
Glucose: 100 mg/dL — ABNORMAL HIGH (ref 70–99)
Potassium: 4.8 mmol/L (ref 3.5–5.2)
Sodium: 142 mmol/L (ref 134–144)
Total Protein: 6.9 g/dL (ref 6.0–8.5)
eGFR: 74 mL/min/{1.73_m2} (ref >59)

## 2023-07-20 LAB — CBC WITH DIFFERENTIAL/PLATELET
Basophils Absolute: 0.1 10*3/uL (ref 0.0–0.2)
Basos: 1 %
EOS (ABSOLUTE): 0.2 10*3/uL (ref 0.0–0.4)
Eos: 2 %
Hematocrit: 41.2 % (ref 34.0–46.6)
Hemoglobin: 13.2 g/dL (ref 11.1–15.9)
Immature Grans (Abs): 0 10*3/uL (ref 0.0–0.1)
Immature Granulocytes: 0 %
Lymphocytes Absolute: 2.2 10*3/uL (ref 0.7–3.1)
Lymphs: 29 %
MCH: 26.7 pg (ref 26.6–33.0)
MCHC: 32 g/dL (ref 31.5–35.7)
MCV: 83 fL (ref 79–97)
Monocytes Absolute: 0.5 10*3/uL (ref 0.1–0.9)
Monocytes: 6 %
Neutrophils Absolute: 4.9 10*3/uL (ref 1.4–7.0)
Neutrophils: 62 %
Platelets: 229 10*3/uL (ref 150–450)
RBC: 4.94 x10E6/uL (ref 3.77–5.28)
RDW: 15 % (ref 11.7–15.4)
WBC: 7.9 10*3/uL (ref 3.4–10.8)

## 2023-07-20 LAB — HEMOGLOBIN A1C
Est. average glucose Bld gHb Est-mCnc: 151 mg/dL
Hgb A1c MFr Bld: 6.9 % — ABNORMAL HIGH (ref 4.8–5.6)

## 2023-07-20 LAB — LIPID PANEL
Chol/HDL Ratio: 2.1 ratio (ref 0.0–4.4)
Cholesterol, Total: 135 mg/dL (ref 100–199)
HDL: 63 mg/dL (ref >39)
LDL Chol Calc (NIH): 52 mg/dL (ref 0–99)
Triglycerides: 109 mg/dL (ref 0–149)
VLDL Cholesterol Cal: 20 mg/dL (ref 5–40)

## 2023-07-20 LAB — TSH: TSH: 2.53 u[IU]/mL (ref 0.450–4.500)

## 2023-07-20 LAB — MICROALBUMIN / CREATININE URINE RATIO
Creatinine, Urine: 57.8 mg/dL
Microalb/Creat Ratio: <5 mg/g{creat} (ref 0–29)
Microalbumin, Urine: <3 ug/mL

## 2023-07-31 ENCOUNTER — Ambulatory Visit: Admitting: Podiatry

## 2023-07-31 ENCOUNTER — Encounter: Payer: Self-pay | Admitting: Podiatry

## 2023-07-31 DIAGNOSIS — M79674 Pain in right toe(s): Secondary | ICD-10-CM | POA: Diagnosis not present

## 2023-07-31 DIAGNOSIS — B351 Tinea unguium: Secondary | ICD-10-CM | POA: Diagnosis not present

## 2023-07-31 DIAGNOSIS — M79675 Pain in left toe(s): Secondary | ICD-10-CM

## 2023-07-31 DIAGNOSIS — B353 Tinea pedis: Secondary | ICD-10-CM | POA: Diagnosis not present

## 2023-07-31 MED ORDER — CLOTRIMAZOLE-BETAMETHASONE 1-0.05 % EX CREA: 1.0000 | TOPICAL_CREAM | Freq: Every day | CUTANEOUS | 2 refills | Status: AC

## 2023-07-31 NOTE — Progress Notes (Signed)
   Chief Complaint  Patient presents with   Diabetes    "My doctor sent me her to have my toenails trimmed." N - toenails L - 1-5 bilateral D - long time O - gradually worse C - long, thick, Diabetic A - shoes T - none    SUBJECTIVE Patient with a history of diabetes mellitus presents to office today complaining of elongated, thickened nails that cause pain while ambulating in shoes.  Patient is unable to trim their own nails. Patient is here for further evaluation and treatment.   Past Medical History:  Diagnosis Date   Arthritis    Bronchitis    none currently   Chronic cough    Costochondritis    Diabetes mellitus without complication (HCC)    8-9 years   Diverticulitis    Diverticulitis 08/2016   GERD (gastroesophageal reflux disease)    Hypercholesteremia    Hypertension    Hypomagnesemia 08/19/2014   Lactic acidosis 08/24/2016   Wears hearing aid    right ear    OBJECTIVE General Patient is awake, alert, and oriented x 3 and in no acute distress. Derm diffuse hyperkeratosis of skin noted to the weightbearing surface of the right foot with peeling and pruritus.  Consistent with tinea pedis nails are tender, long, thickened and dystrophic with subungual debris, consistent with onychomycosis, 1-5 bilateral. No signs of infection noted. Vasc  DP and PT pedal pulses palpable bilaterally. Temperature gradient within normal limits.  Neuro light touch and protective threshold sensation diminished bilaterally.  Musculoskeletal Exam Oligodactyly noted right foot.  Asymptomatic  ASSESSMENT 1. Diabetes Mellitus w/ peripheral neuropathy 2.  Pain due to onychomycosis of toenails bilateral 3.  Tinea pedis right  PLAN OF CARE 1. Patient evaluated today.   2. Instructed to maintain good pedal hygiene and foot care. Stressed importance of controlling blood sugar.  3. Mechanical debridement of nails 1-5 bilaterally performed using a nail nipper. Filed with dremel without  incident.  4.  Prescription for Lotrisone  cream apply twice daily  5.  Return to clinic in 3 mos.     Dot Gazella, DPM [facility]  Dr. Dot Gazella, DPM    [address] [place], [place] [address]                Office [phone]  Fax [phone]

## 2023-08-09 ENCOUNTER — Encounter: Payer: Self-pay | Admitting: Otolaryngology

## 2023-08-09 ENCOUNTER — Other Ambulatory Visit: Payer: Self-pay | Admitting: Otolaryngology

## 2023-08-09 DIAGNOSIS — R42 Dizziness and giddiness: Secondary | ICD-10-CM | POA: Diagnosis not present

## 2023-08-09 DIAGNOSIS — H903 Sensorineural hearing loss, bilateral: Secondary | ICD-10-CM | POA: Diagnosis not present

## 2023-08-09 DIAGNOSIS — H6121 Impacted cerumen, right ear: Secondary | ICD-10-CM | POA: Diagnosis not present

## 2023-08-09 DIAGNOSIS — H7012 Chronic mastoiditis, left ear: Secondary | ICD-10-CM | POA: Diagnosis not present

## 2023-08-09 DIAGNOSIS — H90A22 Sensorineural hearing loss, unilateral, left ear, with restricted hearing on the contralateral side: Secondary | ICD-10-CM

## 2023-08-10 ENCOUNTER — Other Ambulatory Visit: Payer: Self-pay | Admitting: Internal Medicine

## 2023-08-13 NOTE — Telephone Encounter (Signed)
 Requested Prescriptions  Pending Prescriptions Disp Refills   omeprazole  (PRILOSEC) 40 MG capsule [Pharmacy Med Name: OMEPRAZOLE  DR 40 MG CAPSULE] 90 capsule 1    Sig: TAKE 1 CAPSULE (40 MG TOTAL) BY MOUTH DAILY.     Gastroenterology: Proton Pump Inhibitors Passed - 08/13/2023  2:06 PM      Passed - Valid encounter within last 12 months    Recent Outpatient Visits           3 weeks ago Annual physical exam   [facility] at [facility], [place]ales [place], MD   1 month ago Primary osteoarthritis of left knee   [facility] Primary Care & Sports Medicine at [facility], Dessie Flow, MD   1 month ago Primary osteoarthritis of left knee   [facility] Primary Care & Sports Medicine at [facility], Dessie Flow, MD

## 2023-08-28 ENCOUNTER — Ambulatory Visit
Admission: RE | Admit: 2023-08-28 | Discharge: 2023-08-28 | Disposition: A | Discharge status: Home / Self Care | Source: Ambulatory Visit | Attending: Otolaryngology | Admitting: Otolaryngology

## 2023-08-28 DIAGNOSIS — H90A22 Sensorineural hearing loss, unilateral, left ear, with restricted hearing on the contralateral side: Secondary | ICD-10-CM

## 2023-08-28 DIAGNOSIS — I6782 Cerebral ischemia: Secondary | ICD-10-CM | POA: Diagnosis not present

## 2023-08-28 DIAGNOSIS — H9190 Unspecified hearing loss, unspecified ear: Secondary | ICD-10-CM | POA: Diagnosis not present

## 2023-08-28 MED ORDER — GADOPICLENOL 0.5 MMOL/ML IV SOLN
9.0000 mL | Freq: Once | INTRAVENOUS | Status: AC | PRN
  Administered 2023-08-28: 9 mL via INTRAVENOUS

## 2023-08-29 ENCOUNTER — Encounter: Payer: Self-pay | Admitting: Family Medicine

## 2023-08-29 ENCOUNTER — Ambulatory Visit (INDEPENDENT_AMBULATORY_CARE_PROVIDER_SITE_OTHER): Admitting: Family Medicine

## 2023-08-29 DIAGNOSIS — M1712 Unilateral primary osteoarthritis, left knee: Secondary | ICD-10-CM | POA: Diagnosis not present

## 2023-08-29 MED ORDER — DULOXETINE HCL 20 MG PO CPEP: 20.0000 mg | ORAL_CAPSULE | Freq: Every day | ORAL | 0 refills | Status: DC

## 2023-08-29 NOTE — Progress Notes (Signed)
     Primary Care / Sports Medicine Office Visit  Patient Information:  Patient ID: Alison Warren, female DOB: May 07, 1945 Age: 78 y.o. MRN: [id number]   Alison Warren is a pleasant 78 y.o. female presenting with the following:  Chief Complaint  Patient presents with   Knee Pain    Patient presents today with left knee pain. She has not been able to get her rollator walker just yet because of income. She has been wearing her knee brace and it has been helping. She has taking IBU and Tylenol  for her knee pain the ibuprofen helps more than the tylenol .     Vitals:   08/29/23 1055  BP: 112/70  Pulse: 70  SpO2: 93%   Vitals:   08/29/23 1055  Weight: 158 lb (71.7 kg)  Height: 5' (1.524 m)   Body mass index is 30.86 kg/m.  No results found.   Independent interpretation of notes and tests performed by another provider:   None  Procedures performed:   None  Pertinent History, Exam, Impression, and Recommendations:   Problem List Items Addressed This Visit     Primary osteoarthritis of left knee   History of Present Illness Alison Warren is a 78 year old female who presents with knee pain.  Her knee pain has improved since the last visit, with reduced severity allowing for easier mobility. She manages the pain with Tylenol  or ibuprofen as needed.  Assessment and Plan Knee pain Chronic left knee pain with improvement but persistent discomfort. Duloxetine  considered for pain control without gastrointestinal side effects. Knee injection as last resort. - Prescribe duloxetine  once daily. - Continue current pain medications as needed. - Schedule follow-up in two months. - Consider knee injection if pain persists.      Relevant Medications   DULoxetine  (CYMBALTA ) 20 MG capsule     Orders & Medications Medications:  Meds ordered this encounter  Medications   DULoxetine  (CYMBALTA ) 20 MG capsule    Sig: Take 1 capsule (20 mg total) by mouth daily.    Dispense:   60 capsule    Refill:  0   No orders of the defined types were placed in this encounter.    Return in about 2 months (around 10/29/2023), or 40 minute procedure visit left knee.     Ma Saupe, MD, [facility]   Primary Care Sports Medicine Primary Care and Sports Medicine at [facility]

## 2023-08-29 NOTE — Assessment & Plan Note (Signed)
 History of Present Illness Alison Warren is a 78 year old female who presents with knee pain.  Her knee pain has improved since the last visit, with reduced severity allowing for easier mobility. She manages the pain with Tylenol  or ibuprofen as needed.  Assessment and Plan Knee pain Chronic left knee pain with improvement but persistent discomfort. Duloxetine  considered for pain control without gastrointestinal side effects. Knee injection as last resort. - Prescribe duloxetine  once daily. - Continue current pain medications as needed. - Schedule follow-up in two months. - Consider knee injection if pain persists.

## 2023-08-29 NOTE — Patient Instructions (Signed)
 YOUR PLAN:  KNEE PAIN: Chronic left knee pain with some improvement but still causing discomfort. -Start taking duloxetine  once daily for pain control. -Continue using Tylenol  or ibuprofen as needed. -Schedule a follow-up appointment in two months. -Consider a knee injection if the pain does not improve.

## 2023-09-06 ENCOUNTER — Other Ambulatory Visit: Payer: Self-pay | Admitting: Internal Medicine

## 2023-09-06 DIAGNOSIS — E118 Type 2 diabetes mellitus with unspecified complications: Secondary | ICD-10-CM

## 2023-09-06 DIAGNOSIS — I10 Essential (primary) hypertension: Secondary | ICD-10-CM

## 2023-09-06 DIAGNOSIS — E1169 Type 2 diabetes mellitus with other specified complication: Secondary | ICD-10-CM

## 2023-09-07 NOTE — Telephone Encounter (Signed)
 Requested Prescriptions  Pending Prescriptions Disp Refills   ezetimibe  (ZETIA ) 10 MG tablet [Pharmacy Med Name: EZETIMIBE  10 MG TABLET] 90 tablet 1    Sig: TAKE 1 TABLET BY MOUTH EVERY DAY     Cardiovascular:  Antilipid - Sterol Transport Inhibitors Failed - 09/07/2023  4:09 PM      Failed - Lipid Panel in normal range within the last 12 months    Cholesterol, Total  Date Value Ref Range Status  07/18/2023 135 100 - 199 mg/dL Final   LDL Chol Calc (NIH)  Date Value Ref Range Status  07/18/2023 52 0 - 99 mg/dL Final   HDL  Date Value Ref Range Status  07/18/2023 63 >39 mg/dL Final   Triglycerides  Date Value Ref Range Status  07/18/2023 109 0 - 149 mg/dL Final         Passed - AST in normal range and within 360 days    AST  Date Value Ref Range Status  07/18/2023 29 0 - 40 IU/L Final   SGOT(AST)  Date Value Ref Range Status  09/06/2013 41 (H) 15 - 37 Unit/L Final         Passed - ALT in normal range and within 360 days    ALT  Date Value Ref Range Status  07/18/2023 25 0 - 32 IU/L Final   SGPT (ALT)  Date Value Ref Range Status  09/06/2013 52 12 - 78 U/L Final         Passed - Patient is not pregnant      Passed - Valid encounter within last 12 months    Recent Outpatient Visits           1 week ago Primary osteoarthritis of left knee   [place] [facility] at [facility], Dessie Flow, MD   1 month ago Annual physical exam   [place] [facility] Care & Sports Medicine at [facility], [place]ales [place], MD   2 months ago Primary osteoarthritis of left knee   [place] Primary Care & Sports Medicine at [facility], Dessie Flow, MD   2 months ago Primary osteoarthritis of left knee   [place] Primary Care & Sports Medicine at [facility], Dessie Flow, MD               carvedilol  (COREG ) 3.125 MG tablet [Pharmacy Med Name: CARVEDILOL  3.125 MG TABLET] 180 tablet 0    Sig: TAKE 1 TABLET BY MOUTH  TWICE A DAY WITH FOOD     Cardiovascular: Beta Blockers 3 Passed - 09/07/2023  4:09 PM      Passed - Cr in normal range and within 360 days    Creatinine  Date Value Ref Range Status  09/07/2013 0.89 0.60 - 1.30 mg/dL Final   Creatinine, Ser  Date Value Ref Range Status  07/18/2023 0.81 0.57 - 1.00 mg/dL Final         Passed - AST in normal range and within 360 days    AST  Date Value Ref Range Status  07/18/2023 29 0 - 40 IU/L Final   SGOT(AST)  Date Value Ref Range Status  09/06/2013 41 (H) 15 - 37 Unit/L Final         Passed - ALT in normal range and within 360 days    ALT  Date Value Ref Range Status  07/18/2023 25 0 - 32 IU/L Final   SGPT (ALT)  Date Value Ref Range Status  09/06/2013 52 12 - 78 U/L Final         Passed - Last BP in normal range    BP Readings from Last 1 Encounters:  08/29/23 112/70         Passed - Last Heart Rate in normal range    Pulse Readings from Last 1 Encounters:  08/29/23 70         Passed - Valid encounter within last 6 months    Recent Outpatient Visits           1 week ago Primary osteoarthritis of left knee   [place] [facility] Care [facility] Sports Medicine at [facility], Dessie Flow, MD   1 month ago Annual physical exam   [facility] Primary Care & Sports Medicine at [facility], Chales [place], MD   2 months ago Primary osteoarthritis of left knee   [facility] Primary Care & Sports Medicine at [facility], Dessie Flow, MD   2 months ago Primary osteoarthritis of left knee   [facility] Primary Care & Sports Medicine at [facility] Ledger, Dessie Flow, MD               amLODipine  (NORVASC ) 5 MG tablet [Pharmacy Med Name: AMLODIPINE  BESYLATE 5 MG TAB] 90 tablet 0    Sig: TAKE 1 TABLET (5 MG TOTAL) BY MOUTH DAILY.     Cardiovascular: Calcium  Channel Blockers 2 Passed - 09/07/2023  4:09 PM      Passed - Last BP in normal range    BP Readings from Last 1 Encounters:  08/29/23 112/70          Passed - Last Heart Rate in normal range    Pulse Readings from Last 1 Encounters:  08/29/23 70         Passed - Valid encounter within last 6 months    Recent Outpatient Visits           1 week ago Primary osteoarthritis of left knee   [place] [facility] Care & Sports Medicine at [facility], Dessie Flow, MD   1 month ago Annual physical exam   [facility] Primary Care & Sports Medicine at [facility], Chales [place], MD   2 months ago Primary osteoarthritis of left knee   [facility] Primary Care & Sports Medicine at [facility], Dessie Flow, MD   2 months ago Primary osteoarthritis of left knee   [facility] Primary Care & Sports Medicine at [facility], Dessie Flow, MD               metFORMIN  (GLUCOPHAGE -XR) 500 MG 24 hr tablet [Pharmacy Med Name: METFORMIN  HCL ER 500 MG TABLET] 360 tablet 0    Sig: TAKE 2 TABLETS (1,000 MG TOTAL) BY MOUTH IN THE MORNING AND AT BEDTIME.     Endocrinology:  Diabetes - Biguanides Failed - 09/07/2023  4:09 PM      Failed - B12 Level in normal range and within 720 days    Vitamin B-12  Date Value Ref Range Status  12/20/2020 397 232 - 1,245 pg/mL Final         Passed - Cr in normal range and within 360 days    Creatinine  Date Value Ref Range Status  09/07/2013 0.89 0.60 - 1.30 mg/dL Final   Creatinine, Ser  Date Value Ref Range Status  07/18/2023 0.81 0.57 - 1.00 mg/dL Final  Passed - HBA1C is between 0 and 7.9 and within 180 days    Hgb A1c MFr Bld  Date Value Ref Range Status  07/18/2023 6.9 (H) 4.8 - 5.6 % Final    Comment:             Prediabetes: 5.7 - 6.4          Diabetes: >6.4          Glycemic control for adults with diabetes: <7.0          Passed - eGFR in normal range and within 360 days    EGFR (African American)  Date Value Ref Range Status  09/07/2013 >60  Final   GFR calc Af Amer  Date Value Ref Range Status  10/18/2016 >60 >60 mL/min Final    Comment:     (NOTE) The eGFR has been calculated using the CKD EPI equation. This calculation has not been validated in all clinical situations. eGFR's persistently <60 mL/min signify possible Chronic Kidney Disease.    EGFR (Non-African Amer.)  Date Value Ref Range Status  09/07/2013 >60  Final    Comment:    eGFR values <71mL/min/1.73 m2 may be an indication of chronic kidney disease (CKD). Calculated eGFR is useful in patients with stable renal function. The eGFR calculation will not be reliable in acutely ill patients when serum creatinine is changing rapidly. It is not useful in  patients on dialysis. The eGFR calculation may not be applicable to patients at the low and high extremes of body sizes, pregnant women, and vegetarians.    GFR, Estimated  Date Value Ref Range Status  07/03/2021 >60 >60 mL/min Final    Comment:    (NOTE) Calculated using the CKD-EPI Creatinine Equation (2021)    eGFR  Date Value Ref Range Status  07/18/2023 74 >59 mL/min/1.73 Final         Passed - Valid encounter within last 6 months    Recent Outpatient Visits           1 week ago Primary osteoarthritis of left knee   [place] Primary Care & Sports Medicine at [facility], Dessie Flow, MD   1 month ago Annual physical exam   [facility] Primary Care & Sports Medicine at [facility], Chales [place], MD   2 months ago Primary osteoarthritis of left knee   [place] Primary Care & Sports Medicine at [facility], Dessie Flow, MD   2 months ago Primary osteoarthritis of left knee   [place] Primary Care & Sports Medicine at [facility], Dessie Flow, MD              Passed - CBC within normal limits and completed in the last 12 months    WBC  Date Value Ref Range Status  07/18/2023 7.9 3.4 - 10.8 x10E3/uL Final  07/03/2021 10.1 4.0 - 10.5 K/uL Final   RBC  Date Value Ref Range Status  07/18/2023 4.94 3.77 - 5.28 x10E6/uL Final  07/03/2021 5.17 (H)  3.87 - 5.11 MIL/uL Final   Hemoglobin  Date Value Ref Range Status  07/18/2023 13.2 11.1 - 15.9 g/dL Final   Hematocrit  Date Value Ref Range Status  07/18/2023 41.2 34.0 - 46.6 % Final   MCHC  Date Value Ref Range Status  07/18/2023 32.0 31.5 - 35.7 g/dL Final  16/01/9603 54.0 30.0 - 36.0 g/dL Final   [facility]  Date Value Ref Range Status  07/18/2023 26.7 26.6 -  33.0 pg Final  07/03/2021 27.7 26.0 - 34.0 pg Final   MCV  Date Value Ref Range Status  07/18/2023 83 79 - 97 fL Final  09/07/2013 89 80 - 100 fL Final   No results found for: "PLTCOUNTKUC", "LABPLAT", "POCPLA" RDW  Date Value Ref Range Status  07/18/2023 15.0 11.7 - 15.4 % Final  09/07/2013 14.3 11.5 - 14.5 % Final

## 2023-09-07 NOTE — Telephone Encounter (Signed)
 Requested medication (s) are due for refill today: yes  Requested medication (s) are on the active medication list: yes  Last refill:  07/18/23  Future visit scheduled: yes  Notes to clinic:  Unable to refill per protocol, last refill by another provider.      Requested Prescriptions  Pending Prescriptions Disp Refills   ezetimibe  (ZETIA ) 10 MG tablet [Pharmacy Med Name: EZETIMIBE  10 MG TABLET] 90 tablet 1    Sig: TAKE 1 TABLET BY MOUTH EVERY DAY     Cardiovascular:  Antilipid - Sterol Transport Inhibitors Failed - 09/07/2023  4:10 PM      Failed - Lipid Panel in normal range within the last 12 months    Cholesterol, Total  Date Value Ref Range Status  07/18/2023 135 100 - 199 mg/dL Final   LDL Chol Calc (NIH)  Date Value Ref Range Status  07/18/2023 52 0 - 99 mg/dL Final   HDL  Date Value Ref Range Status  07/18/2023 63 >39 mg/dL Final   Triglycerides  Date Value Ref Range Status  07/18/2023 109 0 - 149 mg/dL Final         Passed - AST in normal range and within 360 days    AST  Date Value Ref Range Status  07/18/2023 29 0 - 40 IU/L Final   SGOT(AST)  Date Value Ref Range Status  09/06/2013 41 (H) 15 - 37 Unit/L Final         Passed - ALT in normal range and within 360 days    ALT  Date Value Ref Range Status  07/18/2023 25 0 - 32 IU/L Final   SGPT (ALT)  Date Value Ref Range Status  09/06/2013 52 12 - 78 U/L Final         Passed - Patient is not pregnant      Passed - Valid encounter within last 12 months    Recent Outpatient Visits           1 week ago Primary osteoarthritis of left knee   [place] Primary Care & Sports Medicine at [facility] [place] [facility], Dessie Flow, MD   1 month ago Annual physical exam   [place] [facility] Care & Sports Medicine at [facility], Chales [place], MD   2 months ago Primary osteoarthritis of left knee   [place] Primary Care & Sports Medicine at [facility], Dessie Flow, MD   2 months ago  Primary osteoarthritis of left knee   [place] Primary Care & Sports Medicine at [facility], Dessie Flow, MD              Signed Prescriptions Disp Refills   carvedilol  (COREG ) 3.125 MG tablet 180 tablet 0    Sig: TAKE 1 TABLET BY MOUTH TWICE A DAY WITH FOOD     Cardiovascular: Beta Blockers 3 Passed - 09/07/2023  4:10 PM      Passed - Cr in normal range and within 360 days    Creatinine  Date Value Ref Range Status  09/07/2013 0.89 0.60 - 1.30 mg/dL Final   Creatinine, Ser  Date Value Ref Range Status  07/18/2023 0.81 0.57 - 1.00 mg/dL Final         Passed - AST in normal range and within 360 days    AST  Date Value Ref Range Status  07/18/2023 29 0 - 40 IU/L Final   SGOT(AST)  Date Value Ref Range Status  09/06/2013 41 (H) 15 - 37  Unit/L Final         Passed - ALT in normal range and within 360 days    ALT  Date Value Ref Range Status  07/18/2023 25 0 - 32 IU/L Final   SGPT (ALT)  Date Value Ref Range Status  09/06/2013 52 12 - 78 U/L Final         Passed - Last BP in normal range    BP Readings from Last 1 Encounters:  08/29/23 112/70         Passed - Last Heart Rate in normal range    Pulse Readings from Last 1 Encounters:  08/29/23 70         Passed - Valid encounter within last 6 months    Recent Outpatient Visits           1 week ago Primary osteoarthritis of left knee   [place] Primary Care & Sports Medicine at [facility], Dessie Flow, MD   1 month ago Annual physical exam   [facility] Primary Care & Sports Medicine at [facility], Chales [place], MD   2 months ago Primary osteoarthritis of left knee   [facility] Primary Care & Sports Medicine at [facility], Dessie Flow, MD   2 months ago Primary osteoarthritis of left knee   [facility] Primary Care & Sports Medicine at [facility], Dessie Flow, MD               amLODipine  (NORVASC ) 5 MG tablet 90 tablet 0    Sig: TAKE 1  TABLET (5 MG TOTAL) BY MOUTH DAILY.     Cardiovascular: Calcium  Channel Blockers 2 Passed - 09/07/2023  4:10 PM      Passed - Last BP in normal range    BP Readings from Last 1 Encounters:  08/29/23 112/70         Passed - Last Heart Rate in normal range    Pulse Readings from Last 1 Encounters:  08/29/23 70         Passed - Valid encounter within last 6 months    Recent Outpatient Visits           1 week ago Primary osteoarthritis of left knee   [place] Primary Care & Sports Medicine at [facility], Dessie Flow, MD   1 month ago Annual physical exam   [facility] Primary Care & Sports Medicine at [facility], Chales [place], MD   2 months ago Primary osteoarthritis of left knee   [facility] Primary Care & Sports Medicine at [facility], Dessie Flow, MD   2 months ago Primary osteoarthritis of left knee   [place] Primary Care & Sports Medicine at [facility] Dessie Flow, MD               metFORMIN  (GLUCOPHAGE -XR) 500 MG 24 hr tablet 360 tablet 0    Sig: TAKE 2 TABLETS (1,000 MG TOTAL) BY MOUTH IN THE MORNING AND AT BEDTIME.     Endocrinology:  Diabetes - Biguanides Failed - 09/07/2023  4:10 PM      Failed - B12 Level in normal range and within 720 days    Vitamin B-12  Date Value Ref Range Status  12/20/2020 397 232 - 1,245 pg/mL Final         Passed - Cr in normal range and within 360 days    Creatinine  Date Value Ref Range Status  09/07/2013  0.89 0.60 - 1.30 mg/dL Final   Creatinine, Ser  Date Value Ref Range Status  07/18/2023 0.81 0.57 - 1.00 mg/dL Final         Passed - HBA1C is between 0 and 7.9 and within 180 days    Hgb A1c MFr Bld  Date Value Ref Range Status  07/18/2023 6.9 (H) 4.8 - 5.6 % Final    Comment:             Prediabetes: 5.7 - 6.4          Diabetes: >6.4          Glycemic control for adults with diabetes: <7.0          Passed - eGFR in normal range and within 360 days    EGFR (African  American)  Date Value Ref Range Status  09/07/2013 >60  Final   GFR calc Af Amer  Date Value Ref Range Status  10/18/2016 >60 >60 mL/min Final    Comment:    (NOTE) The eGFR has been calculated using the CKD EPI equation. This calculation has not been validated in all clinical situations. eGFR's persistently <60 mL/min signify possible Chronic Kidney Disease.    EGFR (Non-African Amer.)  Date Value Ref Range Status  09/07/2013 >60  Final    Comment:    eGFR values <27mL/min/1.73 m2 may be an indication of chronic kidney disease (CKD). Calculated eGFR is useful in patients with stable renal function. The eGFR calculation will not be reliable in acutely ill patients when serum creatinine is changing rapidly. It is not useful in  patients on dialysis. The eGFR calculation may not be applicable to patients at the low and high extremes of body sizes, pregnant women, and vegetarians.    GFR, Estimated  Date Value Ref Range Status  07/03/2021 >60 >60 mL/min Final    Comment:    (NOTE) Calculated using the CKD-EPI Creatinine Equation (2021)    eGFR  Date Value Ref Range Status  07/18/2023 74 >59 mL/min/1.73 Final         Passed - Valid encounter within last 6 months    Recent Outpatient Visits           1 week ago Primary osteoarthritis of left knee   [place] Primary Care & Sports Medicine at [facility], Dessie Flow, MD   1 month ago Annual physical exam   [facility] Primary Care & Sports Medicine at [facility], Chales [place], MD   2 months ago Primary osteoarthritis of left knee   [place] Primary Care & Sports Medicine at [facility], Dessie Flow, MD   2 months ago Primary osteoarthritis of left knee   [facility] Primary Care & Sports Medicine at [facility], Dessie Flow, MD              Passed - CBC within normal limits and completed in the last 12 months    WBC  Date Value Ref Range Status  07/18/2023 7.9 3.4  - 10.8 x10E3/uL Final  07/03/2021 10.1 4.0 - 10.5 K/uL Final   RBC  Date Value Ref Range Status  07/18/2023 4.94 3.77 - 5.28 x10E6/uL Final  07/03/2021 5.17 (H) 3.87 - 5.11 MIL/uL Final   Hemoglobin  Date Value Ref Range Status  07/18/2023 13.2 11.1 - 15.9 g/dL Final   Hematocrit  Date Value Ref Range Status  07/18/2023 41.2 34.0 - 46.6 % Final   MCHC  Date Value Ref  Range Status  07/18/2023 32.0 31.5 - 35.7 g/dL Final  86/57/8469 62.9 30.0 - 36.0 g/dL Final   [facility]  Date Value Ref Range Status  07/18/2023 26.7 26.6 - 33.0 pg Final  07/03/2021 27.7 26.0 - 34.0 pg Final   MCV  Date Value Ref Range Status  07/18/2023 83 79 - 97 fL Final  09/07/2013 89 80 - 100 fL Final   No results found for: "PLTCOUNTKUC", "LABPLAT", "POCPLA" RDW  Date Value Ref Range Status  07/18/2023 15.0 11.7 - 15.4 % Final  09/07/2013 14.3 11.5 - 14.5 % Final

## 2023-09-19 ENCOUNTER — Observation Stay (HOSPITAL_BASED_OUTPATIENT_CLINIC_OR_DEPARTMENT_OTHER)
Admit: 2023-09-19 | Discharge: 2023-09-19 | Disposition: A | Discharge status: Home / Self Care | Attending: Internal Medicine | Admitting: Internal Medicine

## 2023-09-19 ENCOUNTER — Observation Stay
Admission: EM | Admit: 2023-09-19 | Discharge: 2023-09-20 | Disposition: A | Discharge status: Home / Self Care | Attending: Internal Medicine | Admitting: Internal Medicine

## 2023-09-19 ENCOUNTER — Emergency Department

## 2023-09-19 ENCOUNTER — Other Ambulatory Visit: Payer: Self-pay

## 2023-09-19 DIAGNOSIS — I10 Essential (primary) hypertension: Secondary | ICD-10-CM | POA: Insufficient documentation

## 2023-09-19 DIAGNOSIS — Z7982 Long term (current) use of aspirin: Secondary | ICD-10-CM | POA: Diagnosis not present

## 2023-09-19 DIAGNOSIS — E785 Hyperlipidemia, unspecified: Secondary | ICD-10-CM | POA: Insufficient documentation

## 2023-09-19 DIAGNOSIS — E118 Type 2 diabetes mellitus with unspecified complications: Secondary | ICD-10-CM

## 2023-09-19 DIAGNOSIS — F39 Unspecified mood [affective] disorder: Secondary | ICD-10-CM | POA: Diagnosis not present

## 2023-09-19 DIAGNOSIS — Z79899 Other long term (current) drug therapy: Secondary | ICD-10-CM | POA: Diagnosis not present

## 2023-09-19 DIAGNOSIS — R079 Chest pain, unspecified: Secondary | ICD-10-CM

## 2023-09-19 DIAGNOSIS — Z7984 Long term (current) use of oral hypoglycemic drugs: Secondary | ICD-10-CM | POA: Diagnosis not present

## 2023-09-19 DIAGNOSIS — R0609 Other forms of dyspnea: Secondary | ICD-10-CM | POA: Diagnosis not present

## 2023-09-19 DIAGNOSIS — R9389 Abnormal findings on diagnostic imaging of other specified body structures: Secondary | ICD-10-CM | POA: Diagnosis not present

## 2023-09-19 DIAGNOSIS — K219 Gastro-esophageal reflux disease without esophagitis: Secondary | ICD-10-CM | POA: Diagnosis not present

## 2023-09-19 DIAGNOSIS — I700 Atherosclerosis of aorta: Secondary | ICD-10-CM | POA: Diagnosis not present

## 2023-09-19 DIAGNOSIS — E119 Type 2 diabetes mellitus without complications: Secondary | ICD-10-CM | POA: Diagnosis not present

## 2023-09-19 DIAGNOSIS — R0789 Other chest pain: Secondary | ICD-10-CM | POA: Diagnosis not present

## 2023-09-19 DIAGNOSIS — J9811 Atelectasis: Secondary | ICD-10-CM | POA: Diagnosis not present

## 2023-09-19 LAB — CBC
HCT: 38.1 % (ref 36.0–46.0)
Hemoglobin: 11.7 g/dL — ABNORMAL LOW (ref 12.0–15.0)
MCH: 26.8 pg (ref 26.0–34.0)
MCHC: 30.7 g/dL (ref 30.0–36.0)
MCV: 87.4 fL (ref 80.0–100.0)
Platelets: 195 10*3/uL (ref 150–400)
RBC: 4.36 MIL/uL (ref 3.87–5.11)
RDW: 15.2 % (ref 11.5–15.5)
WBC: 4.9 10*3/uL (ref 4.0–10.5)
nRBC: 0 % (ref 0.0–0.2)

## 2023-09-19 LAB — BASIC METABOLIC PANEL WITH GFR
Anion gap: 10 (ref 5–15)
BUN: 9 mg/dL (ref 8–23)
CO2: 22 mmol/L (ref 22–32)
Calcium: 8.9 mg/dL (ref 8.9–10.3)
Chloride: 104 mmol/L (ref 98–111)
Creatinine, Ser: 0.68 mg/dL (ref 0.44–1.00)
GFR, Estimated: >60 mL/min (ref >60)
Glucose, Bld: 109 mg/dL — ABNORMAL HIGH (ref 70–99)
Potassium: 5 mmol/L (ref 3.5–5.1)
Sodium: 136 mmol/L (ref 135–145)

## 2023-09-19 LAB — D-DIMER, QUANTITATIVE: D-Dimer, Quant: 2.63 ug{FEU}/mL — ABNORMAL HIGH (ref 0.00–0.50)

## 2023-09-19 LAB — TROPONIN I (HIGH SENSITIVITY)
Troponin I (High Sensitivity): 5 ng/L (ref <18)
Troponin I (High Sensitivity): 5 ng/L (ref <18)
Troponin I (High Sensitivity): 5 ng/L (ref <18)

## 2023-09-19 MED ORDER — ASPIRIN 81 MG PO TBEC
81.0000 mg | DELAYED_RELEASE_TABLET | Freq: Every day | ORAL | Status: DC
  Administered 2023-09-20: 81 mg via ORAL
  Filled 2023-09-19: qty 1

## 2023-09-19 MED ORDER — INSULIN ASPART 100 UNIT/ML IJ SOLN
0.0000 [IU] | Freq: Three times a day (TID) | INTRAMUSCULAR | Status: DC
  Administered 2023-09-20 (×2): 1 [IU] via SUBCUTANEOUS
  Filled 2023-09-19 (×2): qty 1

## 2023-09-19 MED ORDER — ONDANSETRON HCL 4 MG/2ML IJ SOLN
4.0000 mg | Freq: Once | INTRAMUSCULAR | Status: AC
  Administered 2023-09-19: 4 mg via INTRAVENOUS
  Filled 2023-09-19: qty 2

## 2023-09-19 MED ORDER — CARVEDILOL 3.125 MG PO TABS
3.1250 mg | ORAL_TABLET | Freq: Two times a day (BID) | ORAL | Status: DC
  Administered 2023-09-20: 3.125 mg via ORAL
  Filled 2023-09-19: qty 1

## 2023-09-19 MED ORDER — ENOXAPARIN SODIUM 40 MG/0.4ML IJ SOSY
40.0000 mg | PREFILLED_SYRINGE | INTRAMUSCULAR | Status: DC
  Administered 2023-09-19: 40 mg via SUBCUTANEOUS
  Filled 2023-09-19: qty 0.4

## 2023-09-19 MED ORDER — FENTANYL CITRATE PF 50 MCG/ML IJ SOSY
50.0000 ug | PREFILLED_SYRINGE | Freq: Once | INTRAMUSCULAR | Status: AC
  Administered 2023-09-19: 50 ug via INTRAVENOUS
  Filled 2023-09-19: qty 1

## 2023-09-19 MED ORDER — ONDANSETRON HCL 4 MG/2ML IJ SOLN
4.0000 mg | Freq: Four times a day (QID) | INTRAMUSCULAR | Status: DC | PRN
  Administered 2023-09-20: 4 mg via INTRAVENOUS
  Filled 2023-09-19: qty 2

## 2023-09-19 MED ORDER — ATORVASTATIN CALCIUM 20 MG PO TABS
10.0000 mg | ORAL_TABLET | Freq: Every day | ORAL | Status: DC
  Administered 2023-09-20: 10 mg via ORAL
  Filled 2023-09-19: qty 1

## 2023-09-19 MED ORDER — DULOXETINE HCL 20 MG PO CPEP
20.0000 mg | ORAL_CAPSULE | Freq: Every day | ORAL | Status: DC
  Administered 2023-09-20: 20 mg via ORAL
  Filled 2023-09-19: qty 1

## 2023-09-19 MED ORDER — IOHEXOL 350 MG/ML SOLN
75.0000 mL | Freq: Once | INTRAVENOUS | Status: AC | PRN
  Administered 2023-09-19: 75 mL via INTRAVENOUS

## 2023-09-19 MED ORDER — AMLODIPINE BESYLATE 5 MG PO TABS
5.0000 mg | ORAL_TABLET | Freq: Every day | ORAL | Status: DC
  Administered 2023-09-20: 5 mg via ORAL
  Filled 2023-09-19: qty 1

## 2023-09-19 MED ORDER — EZETIMIBE 10 MG PO TABS
10.0000 mg | ORAL_TABLET | Freq: Every day | ORAL | Status: DC
  Administered 2023-09-20: 10 mg via ORAL
  Filled 2023-09-19: qty 1

## 2023-09-19 MED ORDER — LIDOCAINE VISCOUS HCL 2 % MT SOLN
15.0000 mL | Freq: Once | OROMUCOSAL | Status: AC
  Administered 2023-09-19: 15 mL via ORAL
  Filled 2023-09-19: qty 15

## 2023-09-19 MED ORDER — ACETAMINOPHEN 325 MG PO TABS
650.0000 mg | ORAL_TABLET | ORAL | Status: DC | PRN
  Administered 2023-09-19 – 2023-09-20 (×2): 650 mg via ORAL
  Filled 2023-09-19 (×2): qty 2

## 2023-09-19 MED ORDER — PANTOPRAZOLE SODIUM 40 MG PO TBEC
40.0000 mg | DELAYED_RELEASE_TABLET | Freq: Every day | ORAL | Status: DC
  Administered 2023-09-19 – 2023-09-20 (×2): 40 mg via ORAL
  Filled 2023-09-19 (×2): qty 1

## 2023-09-19 MED ORDER — ALUM & MAG HYDROXIDE-SIMETH 200-200-20 MG/5ML PO SUSP
30.0000 mL | Freq: Once | ORAL | Status: AC
  Administered 2023-09-19: 30 mL via ORAL
  Filled 2023-09-19: qty 30

## 2023-09-19 NOTE — H&P (Addendum)
 History and Physical    Patient: Alison Warren ZOX:[id number] DOB: 08/28/1945 DOA: 09/19/2023 DOS: the patient was seen and examined on 09/19/2023 PCP: Sheron Dixons, MD  Patient coming from: Home  Chief Complaint:  Chief Complaint  Patient presents with   Chest Pain   HPI: Alison Warren is a 78 y.o. female with medical history significant of type 2 diabetes, HTN, HLD, GERD who presented to the ED for evaluation of chest pain.  Pt reports onset of chest pain yesterday, describes it as pressure but sometimes burning in nature. She thought it was indigestion and took her reflux medication without improvement.  When the pain persisted today, family became concerned and called EMS.  Pt lives alone.  Pt reports associated shortness of breath and dizziness when pain gets severe.  Also notes having nausea without vomiting.  No fever/chills.  Recent had a cough and runny nose which are better.  She denies improvement after GI cocktail given in the ED.   ED Course: Initial vitals HR 62, RR 20, BP 135/78, spO2 98% on room air. Labs obtained including BMP, CBC were normal aside from non-fasting glucose 109 and Hbg 11.7. Hs troponin was 5, repeat 5 - both normal. EKG without acute ischemic chanages, showed NSR at 64 bpm with non-specific ST/T wave changes.   Imaging - chest x-ray negative. CTA chest/aorta showed nothing acute, ruled out aortic dissection or aneurysm.  Patient treated in the ED with GI cocktail, 50 mcg IV Fentanyl , IV Zofran .    Patient is admitted for observation and further evaluation given persistent chest pain of unclear etiology.   Review of Systems: As mentioned in the history of present illness. All other systems reviewed and are negative.   Past Medical History:  Diagnosis Date   Arthritis    Bronchitis    none currently   Chronic cough    Costochondritis    Diabetes mellitus without complication (HCC)    8-9 years   Diverticulitis    Diverticulitis  08/2016   GERD (gastroesophageal reflux disease)    Hypercholesteremia    Hypertension    Hypomagnesemia 08/19/2014   Lactic acidosis 08/24/2016   Wears hearing aid    right ear   Past Surgical History:  Procedure Laterality Date   CATARACT EXTRACTION W/PHACO Right 12/07/2014   Procedure: CATARACT EXTRACTION PHACO AND INTRAOCULAR LENS PLACEMENT (IOC);  Surgeon: Billee Buddle, MD;  Location: [facility];  Service: Ophthalmology;  Laterality: Right;  DIABETIC - oral meds   CHOLECYSTECTOMY  2007   COLONOSCOPY N/A 02/08/2016   Procedure: COLONOSCOPY;  Surgeon: Deveron Fly, MD;  Location: [facility];  Service: Endoscopy;  Laterality: N/A;  Diabetes   MASTOIDECTOMY     THUMB AMPUTATION     removal of 2nd thumb   TONSILLECTOMY     Social History:  reports that she has never smoked. She has never used smokeless tobacco. She reports that she does not drink alcohol and does not use drugs.  Allergies  Allergen Reactions   Duloxetine  Nausea And Vomiting   Tramadol  Nausea And Vomiting   Motrin [Ibuprofen] Rash    Family History  Problem Relation Age of Onset   Cancer Mother 81       Cervical   Heart attack Father    Diabetes Neg Hx    COPD Neg Hx     Prior to Admission medications   Medication Sig Start Date End Date Taking? Authorizing Provider  amLODipine  (NORVASC ) 5 MG  tablet TAKE 1 TABLET (5 MG TOTAL) BY MOUTH DAILY. 09/07/23   Sheron Dixons, MD  aspirin  81 MG tablet Take 81 mg by mouth daily. AM    [provider]  atorvastatin  (LIPITOR) 10 MG tablet Take 1 tablet (10 mg total) by mouth daily. 03/16/23   Sheron Dixons, MD  carvedilol  (COREG ) 3.125 MG tablet TAKE 1 TABLET BY MOUTH TWICE A DAY WITH FOOD 09/07/23   Berglund, Laura H, MD  clotrimazole -betamethasone  (LOTRISONE ) cream Apply 1 Application topically daily. 07/31/23   Dot Gazella, DPM  DULoxetine  (CYMBALTA ) 20 MG capsule Take 1 capsule (20 mg total) by mouth daily. 08/29/23    Matthews, Jason J, MD  ezetimibe  (ZETIA ) 10 MG tablet TAKE 1 TABLET BY MOUTH EVERY DAY 09/10/23   Sheron Dixons, MD  glucose blood [facility] ULTRA) test strip Use once daily 11/23/21   Berglund, Laura H, MD  Lancets [employer] DELICA PLUS [place]) MISC 1 EACH BY DOES NOT APPLY ROUTE SEE ADMIN INSTRUCTIONS. 12/12/22   Sheron Dixons, MD  metFORMIN  (GLUCOPHAGE -XR) 500 MG 24 hr tablet TAKE 2 TABLETS (1,000 MG TOTAL) BY MOUTH IN THE MORNING AND AT BEDTIME. 09/07/23   Sheron Dixons, MD  omeprazole  (PRILOSEC) 40 MG capsule TAKE 1 CAPSULE (40 MG TOTAL) BY MOUTH DAILY. 08/13/23   Sheron Dixons, MD    Physical Exam: Vitals:   09/19/23 0921 09/19/23 1230  BP: 135/78 137/68  Pulse: 62 68  Resp: 20 16  Temp: 97.8 F (36.6 C)   TempSrc: Oral   SpO2: 98% 94%   General exam: awake, alert, no acute distress HEENT: atraumatic, clear conjunctiva, anicteric sclera, moist mucus membranes, hearing grossly normal  Respiratory system: CTAB, no wheezes, rales or rhonchi, normal respiratory effort. Cardiovascular system: normal S1/S2, RRR, no JVD, murmurs, rubs, gallops, no pedal edema.   Gastrointestinal system: soft, NT, ND, no HSM felt, +bowel sounds. Central nervous system: A&O x3. no gross focal neurologic deficits, normal speech Extremities: moves all , no edema, normal tone Skin: dry, intact, normal temperature, normal color, No rashes, lesions or ulcers Psychiatry: normal mood, congruent affect, judgement and insight appear normal  Data Reviewed:  As reviewed above in HPI  Assessment and Plan:  Chest Pain - unclear etiology. Unlikely ACS with no ischemic EKG changes, normal troponin x 2. Non reproducible on palpation so unlikely musculoskeletal. Esophageal spasm is another possibility Unrelieved by GI cocktail. --Admit for observation --Telemetry --Trend troponin --Repeat stat EKG and troponin if chest pain escalating --Echocardiogram --Check D-dimer --Will consult Cardiology if  abnormal Echo findings  GERD  --continue PPI  Type 2 diabetes, non-insulin  dependent --Hold metformin  since pt got IV contrast --Sensitive sliding scale Novolog   Hypertension  --Continue home amlodipine , Coreg   Hyperlipidemia --Continue home Lipitor and Zetia   Mood disorder --Continue home Cymbalta     Advance Care Planning: Code status - full code  Consults: None at this time  Family Communication: Son at bedside  Severity of Illness: The appropriate patient status for this patient is OBSERVATION. Observation status is judged to be reasonable and necessary in order to provide the required intensity of service to ensure the patient's safety. The patient's presenting symptoms, physical exam findings, and initial radiographic and laboratory data in the context of their medical condition is felt to place them at decreased risk for further clinical deterioration. Furthermore, it is anticipated that the patient will be medically stable for discharge from the hospital within 2 midnights of admission.   Author: Loetta Ringer  Bucky Cardinal, DO 09/19/2023 2:20 PM  For on call review [email or web address].

## 2023-09-19 NOTE — ED Provider Notes (Signed)
 [facility] Provider Note    Event Date/Time   First MD Initiated Contact with Patient 09/19/23 [id number]     (approximate)   History   Chest Pain   HPI  Alison Warren is a 78 y.o. female with history of diabetes, hyperlipidemia hard of hearing fatty liver disease who presents with complaints of chest discomfort.  Symptoms started yesterday night, she reports very mild shortness of breath.  She does report a history of acid reflux also.  No fevers chills or cough.  No calf pain or swelling.     Physical Exam   Triage Vital Signs: ED Triage Vitals  Encounter Vitals Group     BP 09/19/23 0921 135/78     Systolic BP Percentile --      Diastolic BP Percentile --      Pulse Rate 09/19/23 0921 62     Resp 09/19/23 0921 20     Temp 09/19/23 0921 97.8 F (36.6 C)     Temp Source 09/19/23 0921 Oral     SpO2 09/19/23 0921 98 %     Weight --      Height --      Head Circumference --      Peak Flow --      Pain Score 09/19/23 0917 5     Pain Loc --      Pain Education --      Exclude from Growth Chart --     Most recent vital signs: Vitals:   09/19/23 0921 09/19/23 1230  BP: 135/78 137/68  Pulse: 62 68  Resp: 20 16  Temp: 97.8 F (36.6 C)   SpO2: 98% 94%     General: Awake, no distress.  CV:  Good peripheral perfusion.  Regular rate and rhythm Resp:  Normal effort.  Clear to auscultation bilaterally Abd:  No distention.  Other:     ED Results / Procedures / Treatments   Labs (all labs ordered are listed, but only abnormal results are displayed) Labs Reviewed  BASIC METABOLIC PANEL WITH GFR - Abnormal; Notable for the following components:      Result Value   Glucose, Bld 109 (*)    All other components within normal limits  CBC - Abnormal; Notable for the following components:   Hemoglobin 11.7 (*)    All other components within normal limits  TROPONIN I (HIGH SENSITIVITY)  TROPONIN I (HIGH SENSITIVITY)     EKG  ED ECG  REPORT I, Bryson Carbine, the attending physician, personally viewed and interpreted this ECG.  Date: 09/19/2023  Rhythm: normal sinus rhythm QRS Axis: normal Intervals: normal ST/T Wave abnormalities: normal Narrative Interpretation: no evidence of acute ischemia    RADIOLOGY Chest x-ray without acute abnormality    PROCEDURES:  Critical Care performed:   Procedures   MEDICATIONS ORDERED IN ED: Medications  ondansetron  (ZOFRAN ) injection 4 mg (4 mg Intravenous Given 09/19/23 0955)  fentaNYL  (SUBLIMAZE ) injection 50 mcg (50 mcg Intravenous Given 09/19/23 1200)  iohexol  (OMNIPAQUE ) 350 MG/ML injection 75 mL (75 mLs Intravenous Contrast Given 09/19/23 1224)  alum & mag hydroxide-simeth (MAALOX/MYLANTA) 200-200-20 MG/5ML suspension 30 mL (30 mLs Oral Given 09/19/23 1344)    And  lidocaine  (XYLOCAINE ) 2 % viscous mouth solution 15 mL (15 mLs Oral Given 09/19/23 1344)     IMPRESSION / MDM / ASSESSMENT AND PLAN / ED COURSE  I reviewed the triage vital signs and the nursing notes. Patient's presentation is most consistent with acute presentation  with potential threat to life or bodily function.  Patient presents with chest pain as detailed above, differential includes ACS, acid reflux, less likely AAS  High-sensitivity troponin sent, EKG is reassuring  Troponin is normal, will send for CT scan to rule out AAS.  Patient treated with IV fentanyl   CT negative for dissection  Troponin negative x 2, patient continues to complain of chest pain.  Trialed GI cocktail with little improvement.  Given continued chest pain, unclear etiology will consult hospitalist team for further workup      FINAL CLINICAL IMPRESSION(S) / ED DIAGNOSES   Final diagnoses:  Chest pain, unspecified type     Rx / DC Orders   ED Discharge Orders     None        Note:  This document was prepared using Dragon voice recognition software and may include unintentional dictation errors.    Bryson Carbine, MD 09/19/23 [phone]

## 2023-09-19 NOTE — ED Triage Notes (Signed)
 First Nurse Note:  Pt via [facility] from home. Pt c/o chest discomfort for the past 12 hours with SOB with exertion. EMS 12 lead unremarkable. EMS gave 324 ASA. Pt is A&Ox4 and NAD.  160/70 BP  70 HR  98% on RA

## 2023-09-19 NOTE — ED Triage Notes (Signed)
 Pt to ED via [facility] from home. Pt states centralized CP that started last pm. Pt reports increased belching and reflux pain. Pt reports took her medicine this am w/ no relief. PT also states SOB w/ exertion.

## 2023-09-19 NOTE — Progress Notes (Signed)
 Echocardiogram 2D Echocardiogram has been performed.  Alison Warren 09/19/2023, 6:16 PM

## 2023-09-20 ENCOUNTER — Observation Stay

## 2023-09-20 DIAGNOSIS — R7989 Other specified abnormal findings of blood chemistry: Secondary | ICD-10-CM | POA: Diagnosis not present

## 2023-09-20 DIAGNOSIS — R079 Chest pain, unspecified: Secondary | ICD-10-CM | POA: Diagnosis not present

## 2023-09-20 DIAGNOSIS — K7689 Other specified diseases of liver: Secondary | ICD-10-CM | POA: Diagnosis not present

## 2023-09-20 DIAGNOSIS — J9811 Atelectasis: Secondary | ICD-10-CM | POA: Diagnosis not present

## 2023-09-20 LAB — CBG MONITORING, ED
Glucose-Capillary: 128 mg/dL — ABNORMAL HIGH (ref 70–99)
Glucose-Capillary: 130 mg/dL — ABNORMAL HIGH (ref 70–99)

## 2023-09-20 LAB — BASIC METABOLIC PANEL WITH GFR
Anion gap: 10 (ref 5–15)
BUN: 10 mg/dL (ref 8–23)
CO2: 26 mmol/L (ref 22–32)
Calcium: 9.1 mg/dL (ref 8.9–10.3)
Chloride: 103 mmol/L (ref 98–111)
Creatinine, Ser: 0.74 mg/dL (ref 0.44–1.00)
GFR, Estimated: >60 mL/min (ref >60)
Glucose, Bld: 121 mg/dL — ABNORMAL HIGH (ref 70–99)
Potassium: 3.9 mmol/L (ref 3.5–5.1)
Sodium: 139 mmol/L (ref 135–145)

## 2023-09-20 LAB — ECHOCARDIOGRAM COMPLETE
AR max vel: 2.17 cm2
AV Peak grad: 7.4 mmHg
Ao pk vel: 1.36 m/s
Area-P 1/2: 2.37 cm2
S' Lateral: 3.2 cm

## 2023-09-20 MED ORDER — IOHEXOL 350 MG/ML SOLN
75.0000 mL | Freq: Once | INTRAVENOUS | Status: AC | PRN
  Administered 2023-09-20: 75 mL via INTRAVENOUS

## 2023-09-20 NOTE — ED Notes (Signed)
Informed RN bed assigned 

## 2023-09-20 NOTE — Discharge Summary (Addendum)
 Physician Discharge Summary  Alison Warren YNW:[id number] DOB: 1945-10-22 DOA: 09/19/2023  PCP: Sheron Dixons, MD  Admit date: 09/19/2023 Discharge date: 09/20/2023  Admitted From: home  Disposition:  home   Recommendations for Outpatient Follow-up:  Follow up with PCP in 1-2 weeks   Home Health: no  Equipment/Devices:  Discharge Condition: stable  CODE STATUS: full  Diet recommendation: Heart Healthy / Carb Modified  Brief/Interim Summary: HPI was taken from Dr. Antoniette Batty: Alison Warren is a 78 y.o. female with medical history significant of type 2 diabetes, HTN, HLD, GERD who presented to the ED for evaluation of chest pain.  Pt reports onset of chest pain yesterday, describes it as pressure but sometimes burning in nature. She thought it was indigestion and took her reflux medication without improvement.  When the pain persisted today, family became concerned and called EMS.  Pt lives alone.  Pt reports associated shortness of breath and dizziness when pain gets severe.  Also notes having nausea without vomiting.  No fever/chills.  Recent had a cough and runny nose which are better.  She denies improvement after GI cocktail given in the ED.     ED Course: Initial vitals HR 62, RR 20, BP 135/78, spO2 98% on room air. Labs obtained including BMP, CBC were normal aside from non-fasting glucose 109 and Hbg 11.7. Hs troponin was 5, repeat 5 - both normal. EKG without acute ischemic chanages, showed NSR at 64 bpm with non-specific ST/T wave changes.   Imaging - chest x-ray negative. CTA chest/aorta showed nothing acute, ruled out aortic dissection or aneurysm.   Patient treated in the ED with GI cocktail, 50 mcg IV Fentanyl , IV Zofran .     Patient is admitted for observation and further evaluation given persistent chest pain of unclear etiology.    Discharge Diagnoses:  Principal Problem:   Chest pain  Chest pain: unclear etiology, possibly GI related. No CP today. No  ischemic EKG changes. Troponins neg x 2. Echo shows EF 50-55%, grade I diastolic function, no atrial level shunts detected. D-dimer was elevated. CTA chest neg for PE   GERD: continue on PPI    DM2: well controlled, HbA1c 6.9. Continue on SSI w/ accuchecks while in the hospital.    HTN: continue on coreg , amlodipine    HLD: continue on statin, zetia     Mood disorder: unknown type and/or severity. Continue on home dose of duloxetine     Discharge Instructions  Discharge Instructions     Diet - low sodium heart healthy   Complete by: As directed    Diet Carb Modified   Complete by: As directed    Discharge instructions   Complete by: As directed    F/u w/ PCP in 1-2 weeks   Increase activity slowly   Complete by: As directed       Allergies as of 09/20/2023       Reactions   Duloxetine  Nausea And Vomiting   Tramadol  Nausea And Vomiting   Motrin [ibuprofen] Rash        Medication List     TAKE these medications    amLODipine  5 MG tablet Commonly known as: NORVASC  TAKE 1 TABLET (5 MG TOTAL) BY MOUTH DAILY.   aspirin  81 MG tablet Take 81 mg by mouth daily. AM   atorvastatin  10 MG tablet Commonly known as: LIPITOR Take 1 tablet (10 mg total) by mouth daily.   carvedilol  3.125 MG tablet Commonly known as: COREG  TAKE 1 TABLET BY MOUTH TWICE  A DAY WITH FOOD   clotrimazole -betamethasone  cream Commonly known as: LOTRISONE  Apply 1 Application topically daily.   DULoxetine  20 MG capsule Commonly known as: Cymbalta  Take 1 capsule (20 mg total) by mouth daily.   ergocalciferol  1.25 MG (50000 UT) capsule Commonly known as: VITAMIN D2 Take 50,000 Units by mouth once a week.   ezetimibe  10 MG tablet Commonly known as: ZETIA  TAKE 1 TABLET BY MOUTH EVERY DAY   metFORMIN  500 MG 24 hr tablet Commonly known as: GLUCOPHAGE -XR TAKE 2 TABLETS (1,000 MG TOTAL) BY MOUTH IN THE MORNING AND AT BEDTIME.   omeprazole  40 MG capsule Commonly known as: PRILOSEC TAKE 1  CAPSULE (40 MG TOTAL) BY MOUTH DAILY.   OneTouch Delica Plus Lancet33G Misc 1 EACH BY DOES NOT APPLY ROUTE SEE ADMIN INSTRUCTIONS.   OneTouch Ultra test strip Generic drug: glucose blood Use once daily        Allergies  Allergen Reactions   Duloxetine  Nausea And Vomiting   Tramadol  Nausea And Vomiting   Motrin [Ibuprofen] Rash    Consultations:    Procedures/Studies: CT Angio Chest Pulmonary Embolism (PE) W or WO Contrast Result Date: 09/20/2023 CLINICAL DATA:  Chest pain and elevated D-dimer level EXAM: CT ANGIOGRAPHY CHEST WITH CONTRAST TECHNIQUE: Multidetector CT imaging of the chest was performed using the standard protocol during bolus administration of intravenous contrast. Multiplanar CT image reconstructions and MIPs were obtained to evaluate the vascular anatomy. RADIATION DOSE REDUCTION: This exam was performed according to the departmental dose-optimization program which includes automated exposure control, adjustment of the mA and/or kV according to patient size and/or use of iterative reconstruction technique. CONTRAST:  75mL OMNIPAQUE  IOHEXOL  350 MG/ML SOLN COMPARISON:  09/19/2023 chest CT angiogram FINDINGS: Cardiovascular: No filling defect is identified in the pulmonary arterial tree to suggest pulmonary embolus. Aberrant right subclavian artery passes behind the esophagus. Atheromatous vascular calcification of the aortic arch. Mild to moderate cardiomegaly. Mediastinum/Nodes: Unremarkable Lungs/Pleura: Right lower lobe and to a lesser extent right middle lobe atelectasis along the right hemidiaphragm associated with mild right diaphragmatic elevation. Mild biapical pleuroparenchymal scarring. Upper Abdomen: Hepatic marginal nodularity suspicious for cirrhosis. Cholecystectomy. Musculoskeletal: Mild thoracic spondylosis. Review of the MIP images confirms the above findings. IMPRESSION: 1. No filling defect is identified in the pulmonary arterial tree to suggest pulmonary  embolus. 2. Mild right lower lobe and to a lesser extent right middle lobe atelectasis along the right hemidiaphragm associated with mild right diaphragmatic elevation. 3. Mild to moderate cardiomegaly. 4. Hepatic marginal nodularity suspicious for cirrhosis. 5. Aberrant right subclavian artery passes behind the esophagus. 6.  Aortic Atherosclerosis (ICD10-I70.0). Electronically Signed   By: Freida Jes M.D.   On: 09/20/2023 12:02   ECHOCARDIOGRAM COMPLETE Result Date: 09/20/2023    ECHOCARDIOGRAM REPORT   Patient Name:   ALEXSUS PAPADOPOULOS Date of Exam: 09/19/2023 Medical Rec #:  [id number]       Height:       60.0 in Accession #:    [id number]      Weight:       158.0 lb Date of Birth:  06-30-1945       BSA:          1.689 m Patient Age:    78 years        BP:           137/68 mmHg Patient Gender: F               HR:  62 bpm. Exam Location:  [facility] Procedure: 2D Echo, Cardiac Doppler and Color Doppler (Both Spectral and Color            Flow Doppler were utilized during procedure). Indications:     Chest Pain R07.9  History:         Patient has no prior history of Echocardiogram examinations.                  Signs/Symptoms:Chest Pain; Risk Factors:Hypertension, Diabetes                  and Dyslipidemia.  Sonographer:     Terrilee Few RCS Referring Phys:  [id number] Loetta Ringer A GRIFFITH Diagnosing Phys: Antionette Kirks MD IMPRESSIONS  1. Left ventricular ejection fraction, by estimation, is 55 to 60%. The left ventricle has normal function. Left ventricular endocardial border not optimally defined to evaluate regional wall motion. There is mild left ventricular hypertrophy. Left ventricular diastolic parameters are consistent with Grade I diastolic dysfunction (impaired relaxation).  2. Right ventricular systolic function is normal. The right ventricular size is normal. There is normal pulmonary artery systolic pressure.  3. The mitral valve is normal in structure. No evidence of mitral valve  regurgitation. No evidence of mitral stenosis.  4. The aortic valve is normal in structure. Aortic valve regurgitation is not visualized. Aortic valve sclerosis is present, with no evidence of aortic valve stenosis.  5. The inferior vena cava is normal in size with greater than 50% respiratory variability, suggesting right atrial pressure of 3 mmHg. FINDINGS  Left Ventricle: Left ventricular ejection fraction, by estimation, is 55 to 60%. The left ventricle has normal function. Left ventricular endocardial border not optimally defined to evaluate regional wall motion. The left ventricular internal cavity size was normal in size. There is mild left ventricular hypertrophy. Left ventricular diastolic parameters are consistent with Grade I diastolic dysfunction (impaired relaxation). Right Ventricle: The right ventricular size is normal. No increase in right ventricular wall thickness. Right ventricular systolic function is normal. There is normal pulmonary artery systolic pressure. The tricuspid regurgitant velocity is 1.14 m/s, and  with an assumed right atrial pressure of 3 mmHg, the estimated right ventricular systolic pressure is 8.2 mmHg. Left Atrium: Left atrial size was normal in size. Right Atrium: Right atrial size was normal in size. Pericardium: There is no evidence of pericardial effusion. Mitral Valve: The mitral valve is normal in structure. No evidence of mitral valve regurgitation. No evidence of mitral valve stenosis. Tricuspid Valve: The tricuspid valve is normal in structure. Tricuspid valve regurgitation is not demonstrated. No evidence of tricuspid stenosis. Aortic Valve: The aortic valve is normal in structure. Aortic valve regurgitation is not visualized. Aortic valve sclerosis is present, with no evidence of aortic valve stenosis. Aortic valve peak gradient measures 7.4 mmHg. Pulmonic Valve: The pulmonic valve was normal in structure. Pulmonic valve regurgitation is not visualized. No evidence  of pulmonic stenosis. Aorta: The aortic root is normal in size and structure. Venous: The inferior vena cava is normal in size with greater than 50% respiratory variability, suggesting right atrial pressure of 3 mmHg. IAS/Shunts: No atrial level shunt detected by color flow Doppler.  LEFT VENTRICLE PLAX 2D LVIDd:         4.40 cm   Diastology LVIDs:         3.20 cm   LV e' medial:    6.31 cm/s LV PW:         1.20 cm   LV  E/e' medial:  8.9 LV IVS:        1.20 cm   LV e' lateral:   5.98 cm/s LVOT diam:     2.10 cm   LV E/e' lateral: 9.4 LV SV:         65 LV SV Index:   39 LVOT Area:     3.46 cm  RIGHT VENTRICLE             IVC RV S prime:     14.70 cm/s  IVC diam: 1.10 cm TAPSE (M-mode): 1.8 cm LEFT ATRIUM           Index        RIGHT ATRIUM           Index LA diam:      3.10 cm 1.84 cm/m   RA Area:     13.50 cm LA Vol (A2C): 36.7 ml 21.73 ml/m  RA Volume:   29.90 ml  17.71 ml/m LA Vol (A4C): 34.1 ml 20.19 ml/m  AORTIC VALVE AV Area (Vmax): 2.17 cm AV Vmax:        136.00 cm/s AV Peak Grad:   7.4 mmHg LVOT Vmax:      85.40 cm/s LVOT Vmean:     55.000 cm/s LVOT VTI:       0.189 m  AORTA Ao Root diam: 3.20 cm Ao Asc diam:  3.30 cm MITRAL VALVE                TRICUSPID VALVE MV Area (PHT): 2.37 cm     TR Peak grad:   5.2 mmHg MV Decel Time: 320 msec     TR Vmax:        114.00 cm/s MV E velocity: 56.10 cm/s MV A velocity: 102.00 cm/s  SHUNTS MV E/A ratio:  0.55         Systemic VTI:  0.19 m                             Systemic Diam: 2.10 cm Antionette Kirks MD Electronically signed by Antionette Kirks MD Signature Date/Time: 09/20/2023/7:51:07 AM    Final    CT Angio Chest Aorta W and/or Wo Contrast Result Date: 09/19/2023 CLINICAL DATA:  Chest pain. Concern for aortic dissection or aneurysm. EXAM: CT ANGIOGRAPHY CHEST WITH CONTRAST TECHNIQUE: Multidetector CT imaging of the chest was performed using the standard protocol during bolus administration of intravenous contrast. Multiplanar CT image reconstructions and  MIPs were obtained to evaluate the vascular anatomy. RADIATION DOSE REDUCTION: This exam was performed according to the departmental dose-optimization program which includes automated exposure control, adjustment of the mA and/or kV according to patient size and/or use of iterative reconstruction technique. CONTRAST:  75mL OMNIPAQUE  IOHEXOL  350 MG/ML SOLN COMPARISON:  Chest CT dated 06/01/2011. FINDINGS: Cardiovascular: There is no cardiomegaly or pericardial effusion. Mild atherosclerotic calcification of the thoracic aorta. No aneurysmal dilatation or dissection. There is a left-sided aortic arch with aberrant right subclavian artery anatomy. The origins of the great vessels of the aortic arch are patent. No pulmonary artery embolus identified. Mediastinum/Nodes: No hilar or mediastinal adenopathy. The esophagus is grossly unremarkable. No mediastinal fluid collection. Lungs/Pleura: Mild eventration of the right hemidiaphragm. There is minimal right lung base atelectasis. No focal consolidation, pleural effusion, or pneumothorax. The central airways are patent. Upper Abdomen: Cirrhosis.  Cholecystectomy. Musculoskeletal: Osteopenia with degenerative changes of the spine. No acute osseous pathology. Review of the MIP images  confirms the above findings. IMPRESSION: 1. No acute intrathoracic pathology. No aortic dissection or aneurysm. 2. Cirrhosis. 3.  Aortic Atherosclerosis (ICD10-I70.0). Electronically Signed   By: Angus Bark M.D.   On: 09/19/2023 13:03   DG Chest 2 View Result Date: 09/19/2023 CLINICAL DATA:  Chest pain, dyspnea with exertion. EXAM: CHEST - 2 VIEW COMPARISON:  March 18, 2022. FINDINGS: The heart size and mediastinal contours are within normal limits. Stable elevated right hemidiaphragm. Both lungs are clear. The visualized skeletal structures are unremarkable. IMPRESSION: No active cardiopulmonary disease. Electronically Signed   By: Rosalene Colon M.D.   On: 09/19/2023 10:24   MR  BRAIN/IAC W WO CONTRAST Result Date: 08/30/2023 EXAMINATION: MR BRAIN/IAC W WO CONTRAST HISTORY: HEARING LOSS TECHNIQUE: MRI of the brain performed with and without IV contrast. CONTRAST: 10 mL Vueway  IV COMPARISON: 06/21/2000 FINDINGS: No evidence for intracranial mass, hemorrhage, or acute infarct. There are mild bilateral periventricular and subcortical white matter T2 hyperintensities, which are nonspecific, but most likely secondary to chronic microvascular ischemia. The ventricles appear symmetric and the basilar cisterns are patent. There is a mucous retention cyst in the left maxillary sinus. The mastoid air cells are well aerated. The orbits appear normal. The internal auditory canals including the 7th and 8th cranial nerve complexes appear normal. The cochleas and semicircular canals appear normal. No abnormal enhancement is appreciated following the administration of intravenous contrast material. IMPRESSION: No findings to account for the patient's hearing loss. Mild chronic microvascular ischemic changes. Electronically signed by: [place] Engel MD 08/30/2023 07:26 AM EDT RP Workstation: [id number]   (Echo, Carotid, EGD, Colonoscopy, ERCP)    Subjective: Pt denies any chest pain or shortness of breath    Discharge Exam: Vitals:   09/20/23 1000 09/20/23 1015  BP:  135/80  Pulse: 66 66  Resp: 19 (!) 21  Temp:  98.2 F (36.8 C)  SpO2: 95% 94%   Vitals:   09/20/23 0700 09/20/23 0900 09/20/23 1000 09/20/23 1015  BP: (!) 151/76   135/80  Pulse: (!) 57 70 66 66  Resp: 13 (!) 9 19 (!) 21  Temp:    98.2 F (36.8 C)  TempSrc:    Oral  SpO2: 94% 98% 95% 94%  Weight:    68.1 kg  Height:    5' (1.524 m)    General: Pt is alert, awake, not in acute distress Cardiovascular: S1/S2 +, no rubs, no gallops Respiratory: CTA bilaterally, no wheezing, no rhonchi Abdominal: Soft, NT, ND, bowel sounds + Extremities: no cyanosis    The results of significant diagnostics from this  hospitalization (including imaging, microbiology, ancillary and laboratory) are listed below for reference.     Microbiology: No results found for this or any previous visit (from the past 240 hours).   Labs: BNP (last 3 results) No results for input(s): BNP in the last 8760 hours. Basic Metabolic Panel: Recent Labs  Lab 09/19/23 0920 09/20/23 0631  NA 136 139  K 5.0 3.9  CL 104 103  CO2 22 26  GLUCOSE 109* 121*  BUN 9 10  CREATININE 0.68 0.74  CALCIUM  8.9 9.1   Liver Function Tests: No results for input(s): AST, ALT, ALKPHOS, BILITOT, PROT, ALBUMIN in the last 168 hours. No results for input(s): LIPASE, AMYLASE in the last 168 hours. No results for input(s): AMMONIA in the last 168 hours. CBC: Recent Labs  Lab 09/19/23 0920  WBC 4.9  HGB 11.7*  HCT 38.1  MCV 87.4  PLT 195  Cardiac Enzymes: No results for input(s): CKTOTAL, CKMB, CKMBINDEX, TROPONINI in the last 168 hours. BNP: Invalid input(s): POCBNP CBG: Recent Labs  Lab 09/20/23 0756 09/20/23 1132  GLUCAP 128* 130*   D-Dimer Recent Labs    09/19/23 2157  DDIMER 2.63*   Hgb A1c No results for input(s): HGBA1C in the last 72 hours. Lipid Profile No results for input(s): CHOL, HDL, LDLCALC, TRIG, CHOLHDL, LDLDIRECT in the last 72 hours. Thyroid function studies No results for input(s): TSH, T4TOTAL, T3FREE, THYROIDAB in the last 72 hours.  Invalid input(s): FREET3 Anemia work up No results for input(s): VITAMINB12, FOLATE, FERRITIN, TIBC, IRON, RETICCTPCT in the last 72 hours. Urinalysis    Component Value Date/Time   COLORURINE YELLOW (A) 09/14/2021 0323   APPEARANCEUR CLEAR (A) 09/14/2021 0323   APPEARANCEUR Clear 09/06/2013 0833   LABSPEC 1.015 09/14/2021 0323   LABSPEC 1.008 09/06/2013 0833   PHURINE 5.0 09/14/2021 0323   GLUCOSEU NEGATIVE 09/14/2021 0323   GLUCOSEU >=500 09/06/2013 0833   HGBUR NEGATIVE 09/14/2021 0323    BILIRUBINUR NEGATIVE 09/14/2021 0323   BILIRUBINUR neg 02/04/2021 1444   BILIRUBINUR Negative 09/06/2013 0833   KETONESUR NEGATIVE 09/14/2021 0323   PROTEINUR NEGATIVE 09/14/2021 0323   UROBILINOGEN 0.2 02/04/2021 1444   NITRITE NEGATIVE 09/14/2021 0323   LEUKOCYTESUR NEGATIVE 09/14/2021 0323   LEUKOCYTESUR Negative 09/06/2013 0833   Sepsis Labs Recent Labs  Lab 09/19/23 0920  WBC 4.9   Microbiology No results found for this or any previous visit (from the past 240 hours).   Time coordinating discharge: Over 30 minutes  SIGNED:   Alphonsus Jeans, MD  [facility] 09/20/2023, 1:50 PM Pager   If 7PM-7AM, please contact night-coverage

## 2023-09-20 NOTE — ED Notes (Signed)
 Discharge instructions reviewed with pt. Pt voiced understanding of all instructions.

## 2023-09-20 NOTE — TOC Initial Note (Signed)
 Transition of Care [facility]) - Initial/Assessment Note    Patient Details  Name: Alison Warren MRN: [id number] Date of Birth: March 24, 1946  [facility] CM/SW Contact:    Elsie Halo, RN Phone Number: 09/20/2023, 1:27 PM  Clinical Narrative:                    Patient lives at home alone. She states that her vision has declined due to DM. She no longer drives because she doesn't want to harm herself or others. Although her vision has declined she is able to navigate independently in her home and stated that when gets to the point that she is unable to safely care for herself she will be happy to go to a facility. She is adamant that she is not at a point that she needs 24/7 care. She advised that her son is able to drive her to appointments and to run errands. She sees Dr. Rice Chamorro at [facility] in [place] and [facility]. She has a cane that she uses for ambulation.  TOC spoke with the patients son, he advised he has left several messages with DSS related to the [facility] application process. TOC encouraged the son to visit the office in person to initiate the application process. TOC advised of [employer] and got permission to send a referral for assistance with LTC placement. Referral called to [place] [phone] with [employer].  No other TOC needs.       Patient Goals and CMS Choice            Expected Discharge Plan and Services                                              Prior Living Arrangements/Services                       Activities of Daily Living      Permission Sought/Granted                  Emotional Assessment              Admission diagnosis:  Chest pain [R07.9] Patient Active Problem List   Diagnosis Date Noted   Chest pain 09/19/2023   Other specified injury of long flexor muscle, fascia and tendon of right thumb at wrist and hand level, subsequent encounter 06/25/2023   Pruritic disorder 07/26/2022    Cirrhosis of liver without ascites, unspecified hepatic cirrhosis type (HCC) 07/05/2021   Benign essential hypertension 12/20/2020   Degenerative disc disease, cervical 03/23/2020   Chronic bilateral low back pain with bilateral sciatica 02/12/2020   Lumbar spondylosis with myelopathy 02/09/2020   Aortic atherosclerosis (HCC) 01/19/2020   GERD (gastroesophageal reflux disease) 11/21/2018   Hyperlipidemia associated with type 2 diabetes mellitus (HCC) 11/21/2018   Primary osteoarthritis of left knee 05/09/2018   B12 deficiency 04/30/2018   Obesity (BMI 30.0-34.9) 09/14/2017   Hepatic steatosis 02/26/2017   Diverticulitis of colon 09/22/2016   Vitamin D  deficiency 09/01/2016   Type II diabetes mellitus with complication (HCC) 08/24/2016   Hx of adenomatous colonic polyps 02/24/2016   NAFLD (nonalcoholic fatty liver disease) 82/95/6213   Osteopenia of multiple sites 09/09/2015   Seasonal allergic rhinitis due to pollen 08/24/2015   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/2015   Abnormal mammogram with microcalcification 07/02/2014  Muscle cramp, nocturnal 08/20/2013   Hearing loss of both ears 08/19/2013   Blindness of left eye with normal vision in contralateral eye 05/14/2006   PCP:  Sheron Dixons, MD Pharmacy:   [facility] [address] [place] - [address] [place] [address] Phone: [phone] Fax: [phone]     Social Drivers of Health (SDOH) Social History: SDOH Screenings   Food Insecurity: No Food Insecurity (05/09/2023)  Housing: Unknown (05/09/2023)  Transportation Needs: No Transportation Needs (05/09/2023)  Utilities: Not At Risk (05/09/2023)  Alcohol Screen: Low Risk  (05/09/2023)  Depression (PHQ2-9): Low Risk  (07/18/2023)  Financial Resource Strain: Low Risk  (05/09/2023)  Physical Activity: Insufficiently Active (05/09/2023)  Social Connections: Socially Isolated (05/09/2023)  Stress: No Stress Concern Present (02/22/2022)  Tobacco Use: Low  Risk  (09/19/2023)  Health Literacy: Adequate Health Literacy (05/09/2023)   SDOH Interventions:     Readmission Risk Interventions     No data to display

## 2023-09-21 ENCOUNTER — Telehealth: Payer: Self-pay

## 2023-09-21 NOTE — Transitions of Care (Post Inpatient/ED Visit) (Signed)
 09/21/2023  Name: Alison ZUNKER MRN: [id number] DOB: 03-11-1946  Today's TOC FU Call Status: Today's TOC FU Call Status:: Successful TOC FU Call Completed TOC FU Call Complete Date: 09/21/23 Patient's Name and Date of Birth confirmed.  Transition Care Management Follow-up Telephone Call Date of Discharge: 09/20/23 Discharge Facility: [facility]) Type of Discharge: Inpatient Admission Primary Inpatient Discharge Diagnosis:: chest pain How have you been since you were released from the hospital?: Better Any questions or concerns?: No  Items Reviewed: Did you receive and understand the discharge instructions provided?: Yes Medications obtained,verified, and reconciled?: Yes (Medications Reviewed) Any new allergies since your discharge?: No Dietary orders reviewed?: Yes Do you have support at home?: No  Medications Reviewed Today: Medications Reviewed Today     Reviewed by Darrall Ellison, LPN (Licensed Practical Nurse) on 09/21/23 at [phone]  Med List Status: <None>   Medication Order Taking? Sig Documenting Provider Last Dose Status Informant  amLODipine  (NORVASC ) 5 MG tablet [id number] Yes TAKE 1 TABLET (5 MG TOTAL) BY MOUTH DAILY. Sheron Dixons, MD  Active Pharmacy Records, Other, Multiple Informants  aspirin  81 MG tablet [id number] Yes Take 81 mg by mouth daily. AM [provider]  Active Pharmacy Records, Other, Multiple Informants           Med Note [facility], ELIZABETH A   Wed Sep 19, 2023  2:36 PM) PRN  atorvastatin  (LIPITOR) 10 MG tablet [id number] Yes Take 1 tablet (10 mg total) by mouth daily. Sheron Dixons, MD  Active Pharmacy Records, Other, Multiple Informants  carvedilol  (COREG ) 3.125 MG tablet [id number] Yes TAKE 1 TABLET BY MOUTH TWICE A DAY WITH FOOD Sheron Dixons, MD  Active Pharmacy Records, Other, Multiple Informants  clotrimazole -betamethasone  (LOTRISONE ) cream [id number] Yes Apply 1 Application topically daily. Dot Gazella, DPM  Active Pharmacy Records, Other, Multiple Informants  DULoxetine  (CYMBALTA ) 20 MG capsule [id number] Yes Take 1 capsule (20 mg total) by mouth daily. Ma Saupe, MD  Active Pharmacy Records, Other, Multiple Informants  ergocalciferol  (VITAMIN D2) 1.25 MG (50000 UT) capsule [id number] Yes Take 50,000 Units by mouth once a week. [provider]  Active Pharmacy Records, Other, Multiple Informants  ezetimibe  (ZETIA ) 10 MG tablet [id number] Yes TAKE 1 TABLET BY MOUTH EVERY DAY Sheron Dixons, MD  Active Pharmacy Records, Other, Multiple Informants  glucose blood [facility] ULTRA) test strip [id number] Yes Use once daily Sheron Dixons, MD  Active Pharmacy Records, Other, Multiple Informants  Lancets [facility] DELICA PLUS Mel[place]) MISC [id number] Yes 1 EACH BY DOES NOT APPLY ROUTE SEE ADMIN INSTRUCTIONS. Sheron Dixons, MD  Active Pharmacy Records, Other, Multiple Informants  metFORMIN  (GLUCOPHAGE -XR) 500 MG 24 hr tablet [id number] Yes TAKE 2 TABLETS (1,000 MG TOTAL) BY MOUTH IN THE MORNING AND AT BEDTIME. Sheron Dixons, MD  Active Pharmacy Records, Other, Multiple Informants  omeprazole  (PRILOSEC) 40 MG capsule [id number] Yes TAKE 1 CAPSULE (40 MG TOTAL) BY MOUTH DAILY. Sheron Dixons, MD  Active Pharmacy Records, Other, Multiple Informants            Home Care and Equipment/Supplies: Were Home Health Services Ordered?: NA Any new equipment or medical supplies ordered?: NA  Functional Questionnaire: Do you need assistance with bathing/showering or dressing?: No Do you need assistance with meal preparation?: No Do you need assistance with eating?: No Do you have difficulty maintaining continence: No Do you need assistance with getting out of bed/getting out of a chair/moving?: No Do you have  difficulty managing or taking your medications?: No  Follow up appointments reviewed: PCP Follow-up appointment confirmed?: Yes Date of PCP follow-up appointment?:  09/27/23 Follow-up Provider: [facility] Follow-up appointment confirmed?: NA Do you need transportation to your follow-up appointment?: No Do you understand care options if your condition(s) worsen?: Yes-patient verbalized understanding    SIGNATURE Darrall Ellison, LPN [facility] Nurse Health Advisor Direct Dial [phone]

## 2023-09-27 ENCOUNTER — Ambulatory Visit: Admitting: Internal Medicine

## 2023-09-27 ENCOUNTER — Encounter: Payer: Self-pay | Admitting: Internal Medicine

## 2023-09-27 VITALS — BP 118/70 | HR 64 | Ht 60.0 in | Wt 155.0 lb

## 2023-09-27 DIAGNOSIS — R0789 Other chest pain: Secondary | ICD-10-CM | POA: Diagnosis not present

## 2023-09-27 DIAGNOSIS — K219 Gastro-esophageal reflux disease without esophagitis: Secondary | ICD-10-CM

## 2023-09-27 DIAGNOSIS — L723 Sebaceous cyst: Secondary | ICD-10-CM | POA: Diagnosis not present

## 2023-09-27 DIAGNOSIS — I10 Essential (primary) hypertension: Secondary | ICD-10-CM | POA: Diagnosis not present

## 2023-09-27 MED ORDER — PANTOPRAZOLE SODIUM 40 MG PO TBEC: 40.0000 mg | DELAYED_RELEASE_TABLET | Freq: Two times a day (BID) | ORAL | 0 refills | Status: DC

## 2023-09-27 NOTE — Patient Instructions (Signed)
 Put the omeprazole  to the side.  Take in its place pantoprazole  twice a day.

## 2023-09-27 NOTE — Assessment & Plan Note (Signed)
 completed negative work up 09/20/23. no medication changes were made. continue to have acid reflux and water brash

## 2023-09-27 NOTE — Assessment & Plan Note (Signed)
 Recurrent gerd likely the cause of her substernal chest discomfort. Will hold omeprazole  and begin Pantoprazole  bid x 30 days - call for refill if helpful

## 2023-09-27 NOTE — Assessment & Plan Note (Signed)
 Blood pressure is well controlled.  Current medications are Coreg  and amlodipine . Will continue same regimen along with efforts to limit dietary sodium.

## 2023-09-27 NOTE — Assessment & Plan Note (Signed)
 Benign appearing cyst without inflammation Continue to monitor - no intervention needed at this time

## 2023-09-27 NOTE — Progress Notes (Signed)
 Date:  09/27/2023   Name:  Alison Warren   DOB:  19-Apr-1945   MRN:  [id number]   Chief Complaint: Hospitalization Follow-up ER follow up from 09/19/23. Admitted 09/19/23 to 09/20/23.Alison Warren  TOC done on 09/21/23.  Principal Problem:   Chest pain   Chest pain: unclear etiology, possibly GI related. No CP today. No ischemic EKG changes. Troponins neg x 2. Echo shows EF 50-55%, grade I diastolic function, no atrial level shunts detected. D-dimer was elevated. CTA chest neg for PE   GERD: continue on PPI    DM2: well controlled, HbA1c 6.9. Continue on SSI w/ accuchecks while in the hospital.    HTN: continue on coreg , amlodipine    HLD: continue on statin, zetia     Mood disorder: unknown type and/or severity. Continue on home dose of duloxetine     Chest Pain  This is a recurrent problem. The problem has been gradually improving. Associated symptoms include a cough. Pertinent negatives include no abdominal pain, dizziness, fever, headaches, palpitations, shortness of breath or vomiting.  Gastroesophageal Reflux She complains of chest pain, coughing, heartburn and water brash. She reports no abdominal pain or no wheezing. This is a recurrent problem. The problem occurs frequently. The problem has been unchanged. Pertinent negatives include no fatigue.    TAKE these medications     amLODipine  5 MG tablet Commonly known as: NORVASC  TAKE 1 TABLET (5 MG TOTAL) BY MOUTH DAILY.    aspirin  81 MG tablet Take 81 mg by mouth daily. AM    atorvastatin  10 MG tablet Commonly known as: LIPITOR Take 1 tablet (10 mg total) by mouth daily.    carvedilol  3.125 MG tablet Commonly known as: COREG  TAKE 1 TABLET BY MOUTH TWICE A DAY WITH FOOD    clotrimazole -betamethasone  cream Commonly known as: LOTRISONE  Apply 1 Application topically daily.    DULoxetine  20 MG capsule Commonly known as: Cymbalta  Take 1 capsule (20 mg total) by mouth daily.    ergocalciferol  1.25 MG (50000 UT) capsule Commonly  known as: VITAMIN D2 Take 50,000 Units by mouth once a week.    ezetimibe  10 MG tablet Commonly known as: ZETIA  TAKE 1 TABLET BY MOUTH EVERY DAY    metFORMIN  500 MG 24 hr tablet Commonly known as: GLUCOPHAGE -XR TAKE 2 TABLETS (1,000 MG TOTAL) BY MOUTH IN THE MORNING AND AT BEDTIME.    omeprazole  40 MG capsule Commonly known as: PRILOSEC TAKE 1 CAPSULE (40 MG TOTAL) BY MOUTH DAILY.    OneTouch Delica Plus Lancet33G Misc 1 EACH BY DOES NOT APPLY ROUTE SEE ADMIN INSTRUCTIONS.    OneTouch Ultra test strip Generic drug: glucose blood Use once daily   Review of Systems  Constitutional:  Negative for chills, fatigue and fever.  Respiratory:  Positive for cough. Negative for chest tightness, shortness of breath and wheezing.   Cardiovascular:  Positive for chest pain. Negative for palpitations and leg swelling.  Gastrointestinal:  Positive for heartburn. Negative for abdominal pain, diarrhea and vomiting.       Belching   Skin:        Mass in left groin  Neurological:  Negative for dizziness and headaches.  Psychiatric/Behavioral:  Negative for dysphoric mood and sleep disturbance. The patient is not nervous/anxious.      Lab Results  Component Value Date   NA 139 09/20/2023   K 3.9 09/20/2023   CO2 26 09/20/2023   GLUCOSE 121 (H) 09/20/2023   BUN 10 09/20/2023   CREATININE 0.74 09/20/2023  CALCIUM  9.1 09/20/2023   EGFR 74 07/18/2023   GFRNONAA >60 09/20/2023   Lab Results  Component Value Date   CHOL 135 07/18/2023   HDL 63 07/18/2023   LDLCALC 52 07/18/2023   TRIG 109 07/18/2023   CHOLHDL 2.1 07/18/2023   Lab Results  Component Value Date   TSH 2.530 07/18/2023   Lab Results  Component Value Date   HGBA1C 6.9 (H) 07/18/2023   Lab Results  Component Value Date   WBC 4.9 09/19/2023   HGB 11.7 (L) 09/19/2023   HCT 38.1 09/19/2023   MCV 87.4 09/19/2023   PLT 195 09/19/2023   Lab Results  Component Value Date   ALT 25 07/18/2023   AST 29 07/18/2023    ALKPHOS 85 07/18/2023   BILITOT 0.5 07/18/2023   Lab Results  Component Value Date   VD25OH 40.6 07/26/2022     Patient Active Problem List   Diagnosis Date Noted   Sebaceous cyst 09/27/2023   Chest pain 09/19/2023   Other specified injury of long flexor muscle, fascia and tendon of right thumb at wrist and hand level, subsequent encounter 06/25/2023   Pruritic disorder 07/26/2022   Cirrhosis of liver without ascites, unspecified hepatic cirrhosis type (HCC) 07/05/2021   Benign essential hypertension 12/20/2020   Degenerative disc disease, cervical 03/23/2020   Chronic bilateral low back pain with bilateral sciatica 02/12/2020   Lumbar spondylosis with myelopathy 02/09/2020   Aortic atherosclerosis (HCC) 01/19/2020   GERD (gastroesophageal reflux disease) 11/21/2018   Hyperlipidemia associated with type 2 diabetes mellitus (HCC) 11/21/2018   Primary osteoarthritis of left knee 05/09/2018   B12 deficiency 04/30/2018   Obesity (BMI 30.0-34.9) 09/14/2017   Hepatic steatosis 02/26/2017   Diverticulitis of colon 09/22/2016   Vitamin D  deficiency 09/01/2016   Type II diabetes mellitus with complication (HCC) 08/24/2016   Hx of adenomatous colonic polyps 02/24/2016   NAFLD (nonalcoholic fatty liver disease) 46/96/2952   Osteopenia of multiple sites 09/09/2015   Seasonal allergic rhinitis due to pollen 08/24/2015   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/2015   Abnormal mammogram with microcalcification 07/02/2014   Muscle cramp, nocturnal 08/20/2013   Hearing loss of both ears 08/19/2013   Blindness of left eye with normal vision in contralateral eye 05/14/2006    Allergies  Allergen Reactions   Duloxetine  Nausea And Vomiting   Tramadol  Nausea And Vomiting   Motrin [Ibuprofen] Rash    Past Surgical History:  Procedure Laterality Date   CATARACT EXTRACTION W/PHACO Right 12/07/2014   Procedure: CATARACT EXTRACTION PHACO AND INTRAOCULAR LENS PLACEMENT (IOC);  Surgeon: Billee Buddle, MD;  Location: [facility];  Service: Ophthalmology;  Laterality: Right;  DIABETIC - oral meds   CHOLECYSTECTOMY  2007   COLONOSCOPY N/A 02/08/2016   Procedure: COLONOSCOPY;  Surgeon: Deveron Fly, MD;  Location[facility];  Service: Endoscopy;  Laterality: N/A;  Diabetes   MASTOIDECTOMY     THUMB AMPUTATION     removal of 2nd thumb   TONSILLECTOMY      Social History   Tobacco Use   Smoking status: Never   Smokeless tobacco: Never  Vaping Use   Vaping status: Never Used  Substance Use Topics   Alcohol use: No   Drug use: No     Medication list has been reviewed and updated.  Current Meds  Medication Sig   amLODipine  (NORVASC ) 5 MG tablet TAKE 1 TABLET (5 MG TOTAL) BY MOUTH DAILY.   aspirin  81 MG tablet Take 81 mg  by mouth daily. AM   atorvastatin  (LIPITOR) 10 MG tablet Take 1 tablet (10 mg total) by mouth daily.   carvedilol  (COREG ) 3.125 MG tablet TAKE 1 TABLET BY MOUTH TWICE A DAY WITH FOOD   clotrimazole -betamethasone  (LOTRISONE ) cream Apply 1 Application topically daily.   DULoxetine  (CYMBALTA ) 20 MG capsule Take 1 capsule (20 mg total) by mouth daily.   ergocalciferol  (VITAMIN D2) 1.25 MG (50000 UT) capsule Take 50,000 Units by mouth once a week.   ezetimibe  (ZETIA ) 10 MG tablet TAKE 1 TABLET BY MOUTH EVERY DAY   glucose blood (ONETOUCH ULTRA) test strip Use once daily   Lancets (ONETOUCH DELICA PLUS LANCET33G) MISC 1 EACH BY DOES NOT APPLY ROUTE SEE ADMIN INSTRUCTIONS.   metFORMIN  (GLUCOPHAGE -XR) 500 MG 24 hr tablet TAKE 2 TABLETS (1,000 MG TOTAL) BY MOUTH IN THE MORNING AND AT BEDTIME.   omeprazole  (PRILOSEC) 40 MG capsule TAKE 1 CAPSULE (40 MG TOTAL) BY MOUTH DAILY.   pantoprazole  (PROTONIX ) 40 MG tablet Take 1 tablet (40 mg total) by mouth 2 (two) times daily before a meal.       09/27/2023   10:36 AM 07/18/2023   10:17 AM 11/14/2022    8:22 AM 03/24/2022    1:29 PM  GAD 7 : Generalized Anxiety Score  Nervous, Anxious, on Edge 0 0 0  0  Control/stop worrying 0 0 0 0  Worry too much - different things 0 0 0 0  Trouble relaxing 0 0 0 0  Restless 0 0 0 0  Easily annoyed or irritable 0 0 0 0  Afraid - awful might happen 0 0 0 0  Total GAD 7 Score 0 0 0 0  Anxiety Difficulty Not difficult at all Not difficult at all Not difficult at all Not difficult at all       09/27/2023   10:36 AM 07/18/2023   10:17 AM 05/09/2023    8:56 AM  Depression screen PHQ 2/9  Decreased Interest 0 0 0  Down, Depressed, Hopeless 0 0 0  PHQ - 2 Score 0 0 0  Altered sleeping 0 0 0  Tired, decreased energy 0 0 0  Change in appetite 0 0 0  Feeling bad or failure about yourself  0 0 0  Trouble concentrating 0 0 0  Moving slowly or fidgety/restless 0 0 0  Suicidal thoughts 0 0 0  PHQ-9 Score 0 0 0  Difficult doing work/chores Not difficult at all Not difficult at all Not difficult at all    BP Readings from Last 3 Encounters:  09/27/23 118/70  09/20/23 128/63  08/29/23 112/70    Physical Exam Vitals and nursing note reviewed.  Constitutional:      General: She is not in acute distress.    Appearance: Normal appearance. She is well-developed.  HENT:     Head: Normocephalic and atraumatic.   Cardiovascular:     Rate and Rhythm: Normal rate and regular rhythm.     Heart sounds: No murmur heard. Pulmonary:     Effort: Pulmonary effort is normal. No respiratory distress.     Breath sounds: No wheezing or rhonchi.  Abdominal:     Comments: 1 cm smooth, mobile non tender mass c/w sebaceous cyst   Musculoskeletal:     Cervical back: Normal range of motion.     Left knee: Decreased range of motion. Tenderness present over the medial joint line.     Right lower leg: Edema present.     Left lower leg:  Edema present.  Lymphadenopathy:     Cervical: No cervical adenopathy.   Skin:    General: Skin is warm and dry.     Findings: No rash.   Neurological:     Mental Status: She is alert and oriented to person, place, and time.    Psychiatric:        Mood and Affect: Mood normal.        Behavior: Behavior normal.     Wt Readings from Last 3 Encounters:  09/27/23 155 lb (70.3 kg)  09/20/23 150 lb 2.1 oz (68.1 kg)  08/29/23 158 lb (71.7 kg)    BP 118/70   Pulse 64   Ht 5' (1.524 m)   Wt 155 lb (70.3 kg)   SpO2 97%   BMI 30.27 kg/m   Assessment and Plan:  Problem List Items Addressed This Visit       Unprioritized   GERD (gastroesophageal reflux disease) (Chronic)   Recurrent gerd likely the cause of her substernal chest discomfort. Will hold omeprazole  and begin Pantoprazole  bid x 30 days - call for refill if helpful      Relevant Medications   pantoprazole  (PROTONIX ) 40 MG tablet   Benign essential hypertension (Chronic)   Blood pressure is well controlled.  Current medications are Coreg  and amlodipine   Will continue same regimen along with efforts to limit dietary sodium.       Chest pain - Primary   completed negative work up 09/20/23. no medication changes were made. continue to have acid reflux and water brash       Sebaceous cyst   Benign appearing cyst without inflammation Continue to monitor - no intervention needed at this time       No follow-ups on file.    Sheron Dixons, MD [facility] [place]

## 2023-10-19 ENCOUNTER — Other Ambulatory Visit: Payer: Self-pay | Admitting: Internal Medicine

## 2023-10-19 DIAGNOSIS — K219 Gastro-esophageal reflux disease without esophagitis: Secondary | ICD-10-CM

## 2023-10-22 ENCOUNTER — Other Ambulatory Visit: Payer: Self-pay

## 2023-10-22 NOTE — Telephone Encounter (Signed)
 Requested medication (s) are due for refill today: Yes  Requested medication (s) are on the active medication list: Yes  Last refill:  09/27/23  Future visit scheduled: Yes  Notes to clinic:  See pharmacy requests.    Requested Prescriptions  Pending Prescriptions Disp Refills   pantoprazole  (PROTONIX ) 40 MG tablet [Pharmacy Med Name: PANTOPRAZOLE  SOD DR 40 MG TAB] 180 tablet 1    Sig: TAKE 1 TABLET (40 MG TOTAL) BY MOUTH TWICE A DAY BEFORE MEALS     Gastroenterology: Proton Pump Inhibitors Passed - 10/22/2023  1:26 PM      Passed - Valid encounter within last 12 months    Recent Outpatient Visits           3 weeks ago Other chest pain   [place] [facility] at [facility], Leita DEL, MD   1 month ago Primary osteoarthritis of left knee   [facility] at [facility], Selinda PARAS, MD   3 months ago Annual physical exam   [facility] at [facility], Leita DEL, MD   3 months ago Primary osteoarthritis of left knee   [facility] at [facility], Selinda PARAS, MD   3 months ago Primary osteoarthritis of left knee   [facility] at [facility], Jason J, MD

## 2023-10-25 ENCOUNTER — Telehealth: Payer: Self-pay | Admitting: Internal Medicine

## 2023-10-25 DIAGNOSIS — K219 Gastro-esophageal reflux disease without esophagitis: Secondary | ICD-10-CM

## 2023-10-25 NOTE — Telephone Encounter (Signed)
 Medication, pantoprazole , was sent to preferred pharmacy on 10/22/23. Called and confirmed with [facility] staff that prescription was received and filled, ready for pick up. Not appropriate for refill at this time. Closing encounter.

## 2023-10-25 NOTE — Telephone Encounter (Signed)
 Copied from CRM [id number]. Topic: Clinical - Medication Refill >> Oct 25, 2023  3:52 PM Delon DASEN wrote: Medication: pantoprazole  (PROTONIX ) 40 MG tablet  Has the patient contacted their pharmacy? No (Agent: If no, request that the patient contact the pharmacy for the refill. If patient does not wish to contact the pharmacy document the reason why and proceed with request.) (Agent: If yes, when and what did the pharmacy advise?)  This is the patient's preferred pharmacy:  [phone] [address], [place] - [address] [place] [address] Phone: [phone] Fax: [phone]  Is this the correct pharmacy for this prescription? Yes If no, delete pharmacy and type the correct one.   Has the prescription been filled recently? Yes  Is the patient out of the medication? Yes  Has the patient been seen for an appointment in the last year OR does the patient have an upcoming appointment? Yes  Can we respond through MyChart? Yes  Agent: Please be advised that Rx refills may take up to 3 business days. We ask that you follow-up with your pharmacy.

## 2023-10-26 NOTE — Telephone Encounter (Unsigned)
 Copied from CRM [id number]. Topic: Clinical - Medication Question >> Oct 25, 2023  3:54 PM Delon DASEN wrote: Reason for CRM: Patient needed to let Leita know that the new acid reflux prescription is working really good submitted for refill.

## 2023-10-29 ENCOUNTER — Encounter: Payer: Self-pay | Admitting: Podiatry

## 2023-10-29 ENCOUNTER — Ambulatory Visit: Admitting: Podiatry

## 2023-10-29 DIAGNOSIS — E1142 Type 2 diabetes mellitus with diabetic polyneuropathy: Secondary | ICD-10-CM | POA: Diagnosis not present

## 2023-10-29 DIAGNOSIS — M79674 Pain in right toe(s): Secondary | ICD-10-CM

## 2023-10-29 DIAGNOSIS — M79675 Pain in left toe(s): Secondary | ICD-10-CM

## 2023-10-29 DIAGNOSIS — B351 Tinea unguium: Secondary | ICD-10-CM

## 2023-10-29 LAB — HM DIABETES FOOT EXAM: HM Diabetic Foot Exam: ABNORMAL

## 2023-10-29 NOTE — Progress Notes (Signed)
  Subjective:  Patient ID: Alison Warren, female    DOB: 07-31-1945,  MRN: [id number]  78 y.o. female presents to clinic with  at risk foot care with history of diabetic neuropathy and painful thick toenails that are difficult to trim. Pain interferes with ambulation. Aggravating factors include wearing enclosed shoe gear. Pain is relieved with periodic professional debridement. 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 Chief Complaint  Patient presents with   RFC    Rm1 [facility] Diabetic / Dr. Justus Last visit June 2025/ A1c 6.9     New problem(s): None   PCP is Justus Leita DEL, MD. Alison Warren 09/27/2023.  Allergies  Allergen Reactions   Tramadol  Nausea And Vomiting   Motrin [Ibuprofen] Rash    Review of Systems: Negative except as noted in the HPI.   Objective:  Alison Warren is a pleasant 78 y.o. female WD, WN in NAD. AAO x 3.  Vascular Examination: Vascular status intact b/l with palpable pedal pulses. CFT immediate b/l. No edema. No pain with calf compression b/l. Skin temperature gradient WNL b/l. Pedal hair absent.  Neurological Examination: Protective sensation diminished with 10g monofilament b/l.  Dermatological Examination: Pedal skin with normal turgor, texture and tone b/l. Toenails 1-5 left, 1, 2, 5 right  thick, discolored, elongated with subungual debris and pain on dorsal palpation. No hyperkeratotic lesions noted b/l. Diffuse scaling noted peripherally and plantarly b/l feet.  No interdigital macerations.  No blisters, no weeping. No signs of secondary bacterial infection noted.  Musculoskeletal Examination: Muscle strength 5/5 to b/l LE. Oligodactyly of right foot.  Radiographs: None  Last A1c:      Latest Ref Rng & Units 07/18/2023   11:00 AM 03/16/2023   11:41 AM 11/14/2022    9:06 AM  Hemoglobin A1C  Hemoglobin-A1c 4.8 - 5.6 % 6.9  7.8  7.6      Assessment:   1. Pain due to onychomycosis of toenails of both feet   2. Diabetic  peripheral neuropathy associated with type 2 diabetes mellitus (HCC)    Plan:  Patient was evaluated and treated. All patient's and/or POA's questions/concerns addressed on today's visit. She has Lotrisone  cream from previous visit. Patient instructed to apply Lotrisone  cream to both feet twice daily for tinea pedis. Toenails 1-5 left, 1, 2, 5 right debrided in length and girth without incident. Continue foot and shoe inspections daily. Monitor blood glucose per PCP/Endocrinologist's recommendations. Continue soft, supportive shoe gear daily. Report any pedal injuries to medical professional. Call office if there are any questions/concerns. -Patient/POA to call should there be question/concern in the interim.  Return in about 3 months (around 01/29/2024).  Delon LITTIE Merlin, DPM      [place] LOCATION: [address] [place] [address]                   Office [phone]   [facility] LOCATION: [address] [place], [place] [address] Office [phone]

## 2023-10-30 ENCOUNTER — Other Ambulatory Visit (INDEPENDENT_AMBULATORY_CARE_PROVIDER_SITE_OTHER): Payer: Self-pay | Admitting: Radiology

## 2023-10-30 ENCOUNTER — Encounter: Payer: Self-pay | Admitting: Family Medicine

## 2023-10-30 ENCOUNTER — Ambulatory Visit (INDEPENDENT_AMBULATORY_CARE_PROVIDER_SITE_OTHER): Admitting: Family Medicine

## 2023-10-30 VITALS — BP 118/64 | HR 74 | Ht 60.0 in | Wt 152.8 lb

## 2023-10-30 DIAGNOSIS — M1712 Unilateral primary osteoarthritis, left knee: Secondary | ICD-10-CM

## 2023-10-30 MED ORDER — TRIAMCINOLONE ACETONIDE 40 MG/ML IJ SUSP
40.0000 mg | Freq: Once | INTRAMUSCULAR | Status: AC
  Administered 2023-10-30: 40 mg via INTRAMUSCULAR

## 2023-10-30 MED ORDER — DULOXETINE HCL 20 MG PO CPEP: 40.0000 mg | ORAL_CAPSULE | Freq: Every day | ORAL | 0 refills | Status: DC

## 2023-10-30 NOTE — Patient Instructions (Signed)
 You have just been given a cortisone injection to reduce pain and inflammation. After the injection you may notice immediate relief of pain as a result of the Lidocaine . It is important to rest the area of the injection for 24 to 48 hours after the injection. There is a possibility of some temporary increased discomfort and swelling for up to 72 hours until the cortisone begins to work. If you do have pain, simply rest the joint and use ice. If you can tolerate over the counter medications, you can try Tylenol , Aleve, or Advil for added relief per package instructions.  Patient Plan for Post-Visit Guidance  1. Left Knee Osteoarthritis:    - Received a cortisone injection today under ultrasound guidance.    - Increase duloxetine  dosage to 40 mg daily, with the option to increase to 60 mg if tolerated.    - Use a cane daily to help reduce pressure on the knee.    - Avoid using tight knee wraps to prevent swelling.    - Home health physical therapy will be arranged to optimize knee function.    - Contact your insurance to check coverage for potential gel injections.  Red Flags: - If you experience increased pain, swelling, or any new symptoms, contact the office immediately.

## 2023-10-30 NOTE — Assessment & Plan Note (Signed)
 History of Present Illness CLEOPHA INDELICATO is a 78 year old female who presents with chronic left knee pain, worsening over the past 1 week. She is accompanied by her son.  Knee pain - Chronic knee pain, persistent and unchanged in severity despite management changes over the past week - Initial improvement after last visit on May 21st, but pain has not subsided since then - Pain described as 'bone to bone' with concern for lack of cartilage - Pain located in the knee with radiation down to the ankle - Uses a cane and wraps the knee during the day for pain relief; unwraps at night - Previous cortisone injections provided some relief, but pain recurred - Started duloxetine  20 mg two months ago for pain management, but pain persists, no adverse effects reported  Functional impairment - Difficulty performing daily activities, including housework and cooking, due to knee pain  Physical Exam STRENGTH: Knee pain and swelling present  Assessment and Plan Left knee osteoarthritis with pain Chronic knee osteoarthritis with persistent pain. Previous cortisone injections and duloxetine  showed some improvement.  TKA considered if conservative measures fail. Discussed increasing duloxetine  dosage for better pain management. - Administer cortisone injection to the knee today under ultrasound guidance. - Increase duloxetine  dosage to 40 mg daily, potentially to 60 mg if tolerated. - Encourage daily use of a cane to reduce knee pressure. - Avoid tight knee wrapping to prevent swelling. - Arrange home health physical therapy for knee function optimization. - Contact insurance for potential gel injection coverage.

## 2023-10-30 NOTE — Progress Notes (Signed)
 Primary Care / Sports Medicine Office Visit  Patient Information:  Patient ID: Alison Warren, female DOB: [birth year] Age: 78 y.o. MRN: [id number]   Alison Warren is a pleasant 78 y.o. female presenting with the following:  Chief Complaint  Patient presents with   Knee Pain    Chronic left knee pain. Patient presents today for a cortisone injection. Her last injection helped up until about 1 week ago.    Vitals:   10/30/23 1033  BP: 118/64  Pulse: 74  SpO2: 94%   Vitals:   10/30/23 1033  Weight: 152 lb 12.8 oz (69.3 kg)  Height: 5' (1.524 m)   Body mass index is 29.84 kg/m.  No results found.   Independent interpretation of notes and tests performed by another provider:   None  Procedures performed:   Procedure:  Injection of left knee under ultrasound guidance. Ultrasound guidance utilized for anterolateral approach, joint space visualized Samsung HS60 device utilized with permanent recording / reporting. Verbal informed consent obtained and verified. Skin prepped in a sterile fashion. Ethyl chloride for topical local analgesia.  Completed without difficulty and tolerated well. Medication: triamcinolone  acetonide 40 mg/mL suspension for injection 1 mL total and 2 mL lidocaine  1% without epinephrine  utilized for needle placement anesthetic Advised to contact for fevers/chills, erythema, induration, drainage, or persistent bleeding.   Pertinent History, Exam, Impression, and Recommendations:   Problem List Items Addressed This Visit     Primary osteoarthritis of left knee - Primary   History of Present Illness Alison Warren is a 78 year old female who presents with chronic left knee pain, worsening over the past 1 week. She is accompanied by her son.  Knee pain - Chronic knee pain, persistent and unchanged in severity despite management changes over the past week - Initial improvement after last visit on May 21st, but pain has not subsided since  then - Pain described as 'bone to bone' with concern for lack of cartilage - Pain located in the knee with radiation down to the ankle - Uses a cane and wraps the knee during the day for pain relief; unwraps at night - Previous cortisone injections provided some relief, but pain recurred - Started duloxetine  20 mg two months ago for pain management, but pain persists, no adverse effects reported  Functional impairment - Difficulty performing daily activities, including housework and cooking, due to knee pain  Physical Exam STRENGTH: Knee pain and swelling present  Assessment and Plan Left knee osteoarthritis with pain Chronic knee osteoarthritis with persistent pain. Previous cortisone injections and duloxetine  showed some improvement.  TKA considered if conservative measures fail. Discussed increasing duloxetine  dosage for better pain management. - Administer cortisone injection to the knee today under ultrasound guidance. - Increase duloxetine  dosage to 40 mg daily, potentially to 60 mg if tolerated. - Encourage daily use of a cane to reduce knee pressure. - Avoid tight knee wrapping to prevent swelling. - Arrange home health physical therapy for knee function optimization. - Contact insurance for potential gel injection coverage.      Relevant Medications   DULoxetine  (CYMBALTA ) 20 MG capsule   Other Relevant Orders   US  LIMITED JOINT SPACE STRUCTURES LOW LEFT   Ambulatory referral to Home Health     Orders & Medications Medications:  Meds ordered this encounter  Medications   DULoxetine  (CYMBALTA ) 20 MG capsule    Sig: Take 2 capsules (40 mg total) by mouth daily.    Dispense:  180  capsule    Refill:  0   triamcinolone  acetonide (KENALOG -40) injection 40 mg   Orders Placed This Encounter  Procedures   US  LIMITED JOINT SPACE STRUCTURES LOW LEFT   Ambulatory referral to Home Health     Return in about 3 months (around 01/30/2024).     Alison JINNY Ku, MD,  [facility]   Primary Care Sports Medicine Primary Care and Sports Medicine at [facility]

## 2023-11-02 ENCOUNTER — Other Ambulatory Visit: Payer: Self-pay | Admitting: Internal Medicine

## 2023-11-02 DIAGNOSIS — E1169 Type 2 diabetes mellitus with other specified complication: Secondary | ICD-10-CM

## 2023-11-02 NOTE — Telephone Encounter (Signed)
 Requested Prescriptions  Pending Prescriptions Disp Refills   atorvastatin  (LIPITOR) 10 MG tablet [Pharmacy Med Name: ATORVASTATIN  10 MG TABLET] 90 tablet 2    Sig: TAKE 1 TABLET BY MOUTH EVERY DAY     Cardiovascular:  Antilipid - Statins Failed - 11/02/2023  5:48 PM      Failed - Lipid Panel in normal range within the last 12 months    Cholesterol, Total  Date Value Ref Range Status  07/18/2023 135 100 - 199 mg/dL Final   LDL Chol Calc (NIH)  Date Value Ref Range Status  07/18/2023 52 0 - 99 mg/dL Final   HDL  Date Value Ref Range Status  07/18/2023 63 >39 mg/dL Final   Triglycerides  Date Value Ref Range Status  07/18/2023 109 0 - 149 mg/dL Final         Passed - Patient is not pregnant      Passed - Valid encounter within last 12 months    Recent Outpatient Visits           3 days ago Primary osteoarthritis of left knee   [place] [facility] Alvia, Selinda PARAS, MD   1 month ago Other chest pain   [place] [facility] at [facility], Leita DEL, MD   2 months ago Primary osteoarthritis of left knee   [facility] at [facility] Alvia, Selinda PARAS, MD   3 months ago Annual physical exam   [facility] at [facility], Leita DEL, MD   4 months ago Primary osteoarthritis of left knee   [facility] at [facility], Jason J, MD

## 2023-11-13 DIAGNOSIS — M1712 Unilateral primary osteoarthritis, left knee: Secondary | ICD-10-CM | POA: Diagnosis not present

## 2023-11-19 DIAGNOSIS — M25562 Pain in left knee: Secondary | ICD-10-CM | POA: Diagnosis not present

## 2023-11-19 DIAGNOSIS — R609 Edema, unspecified: Secondary | ICD-10-CM | POA: Diagnosis not present

## 2023-11-19 DIAGNOSIS — R509 Fever, unspecified: Secondary | ICD-10-CM | POA: Diagnosis not present

## 2023-11-20 ENCOUNTER — Ambulatory Visit: Admitting: Internal Medicine

## 2023-11-20 ENCOUNTER — Other Ambulatory Visit: Payer: Self-pay

## 2023-11-20 ENCOUNTER — Emergency Department

## 2023-11-20 ENCOUNTER — Emergency Department
Admission: EM | Admit: 2023-11-20 | Discharge: 2023-11-20 | Disposition: A | Discharge status: Home / Self Care | Attending: Emergency Medicine | Admitting: Emergency Medicine

## 2023-11-20 DIAGNOSIS — I10 Essential (primary) hypertension: Secondary | ICD-10-CM | POA: Insufficient documentation

## 2023-11-20 DIAGNOSIS — E119 Type 2 diabetes mellitus without complications: Secondary | ICD-10-CM | POA: Insufficient documentation

## 2023-11-20 DIAGNOSIS — M1712 Unilateral primary osteoarthritis, left knee: Secondary | ICD-10-CM | POA: Diagnosis not present

## 2023-11-20 DIAGNOSIS — M25462 Effusion, left knee: Secondary | ICD-10-CM | POA: Diagnosis not present

## 2023-11-20 DIAGNOSIS — G8929 Other chronic pain: Secondary | ICD-10-CM

## 2023-11-20 DIAGNOSIS — M25562 Pain in left knee: Secondary | ICD-10-CM | POA: Diagnosis not present

## 2023-11-20 LAB — CBC WITH DIFFERENTIAL/PLATELET
Abs Immature Granulocytes: 0.02 K/uL (ref 0.00–0.07)
Basophils Absolute: 0.1 K/uL (ref 0.0–0.1)
Basophils Relative: 1 %
Eosinophils Absolute: 0.1 K/uL (ref 0.0–0.5)
Eosinophils Relative: 1 %
HCT: 39 % (ref 36.0–46.0)
Hemoglobin: 12.5 g/dL (ref 12.0–15.0)
Immature Granulocytes: 0 %
Lymphocytes Relative: 30 %
Lymphs Abs: 2.6 K/uL (ref 0.7–4.0)
MCH: 27.4 pg (ref 26.0–34.0)
MCHC: 32.1 g/dL (ref 30.0–36.0)
MCV: 85.3 fL (ref 80.0–100.0)
Monocytes Absolute: 0.7 K/uL (ref 0.1–1.0)
Monocytes Relative: 8 %
Neutro Abs: 5.1 K/uL (ref 1.7–7.7)
Neutrophils Relative %: 60 %
Platelets: 226 K/uL (ref 150–400)
RBC: 4.57 MIL/uL (ref 3.87–5.11)
RDW: 14.9 % (ref 11.5–15.5)
WBC: 8.5 K/uL (ref 4.0–10.5)
nRBC: 0 % (ref 0.0–0.2)

## 2023-11-20 LAB — COMPREHENSIVE METABOLIC PANEL WITH GFR
ALT: 31 U/L (ref 0–44)
AST: 35 U/L (ref 15–41)
Albumin: 3.8 g/dL (ref 3.5–5.0)
Alkaline Phosphatase: 87 U/L (ref 38–126)
Anion gap: 8 (ref 5–15)
BUN: 18 mg/dL (ref 8–23)
CO2: 25 mmol/L (ref 22–32)
Calcium: 10 mg/dL (ref 8.9–10.3)
Chloride: 105 mmol/L (ref 98–111)
Creatinine, Ser: 0.94 mg/dL (ref 0.44–1.00)
GFR, Estimated: >60 mL/min (ref >60)
Glucose, Bld: 121 mg/dL — ABNORMAL HIGH (ref 70–99)
Potassium: 4.2 mmol/L (ref 3.5–5.1)
Sodium: 138 mmol/L (ref 135–145)
Total Bilirubin: 0.8 mg/dL (ref 0.0–1.2)
Total Protein: 7.5 g/dL (ref 6.5–8.1)

## 2023-11-20 LAB — SYNOVIAL CELL COUNT + DIFF, W/ CRYSTALS
Eosinophils-Synovial: 0 %
Lymphocytes-Synovial Fld: 4 %
Monocyte-Macrophage-Synovial Fluid: 9 %
Neutrophil, Synovial: 87 %
WBC, Synovial: 16623 /mm3 — ABNORMAL HIGH (ref 0–200)

## 2023-11-20 LAB — LACTATE DEHYDROGENASE: LDH: 114 U/L (ref 98–192)

## 2023-11-20 MED ORDER — DEXAMETHASONE SODIUM PHOSPHATE 10 MG/ML IJ SOLN
10.0000 mg | Freq: Once | INTRAMUSCULAR | Status: AC
  Administered 2023-11-20 (×2): 10 mg via INTRA_ARTICULAR
  Filled 2023-11-20: qty 1

## 2023-11-20 MED ORDER — KETOROLAC TROMETHAMINE 30 MG/ML IJ SOLN
15.0000 mg | Freq: Once | INTRAMUSCULAR | Status: AC
  Administered 2023-11-20 (×2): 15 mg via INTRAMUSCULAR
  Filled 2023-11-20: qty 1

## 2023-11-20 MED ORDER — ONDANSETRON 4 MG PO TBDP
4.0000 mg | ORAL_TABLET | Freq: Once | ORAL | Status: AC
  Administered 2023-11-20 (×2): 4 mg via ORAL
  Filled 2023-11-20: qty 1

## 2023-11-20 MED ORDER — ACETAMINOPHEN 325 MG PO TABS
650.0000 mg | ORAL_TABLET | Freq: Once | ORAL | Status: AC
  Administered 2023-11-20 (×2): 650 mg via ORAL
  Filled 2023-11-20: qty 2

## 2023-11-20 MED ORDER — LIDOCAINE-EPINEPHRINE 1 %-1:100000 IJ SOLN
20.0000 mL | Freq: Once | INTRAMUSCULAR | Status: AC
  Administered 2023-11-20 (×2): 20 mL
  Filled 2023-11-20: qty 1

## 2023-11-20 NOTE — ED Provider Notes (Addendum)
 [facility] Provider Note    Event Date/Time   First MD Initiated Contact with Patient 11/20/23 0217     (approximate)   History   Knee Pain   HPI  Alison Warren is a 78 y.o. female   Past medical history of diabetes, costochondritis, arthritis, chronic left knee pain, hyperlipidemia hypertension presents emergency department with acute on chronic left knee pain.  She has had redness and swelling that is typical of her flareups of her arthritis pain.  There is no acute injury noted.  She has no fevers or chills.  Is quite severe.   She typically gets joint injections by her primary doctor and has not had one for approximately a month.   External Medical Documents Reviewed: Dr. Alvia primary care note with the performed joint injection steroid      Physical Exam   Triage Vital Signs: ED Triage Vitals  Encounter Vitals Group     BP 11/20/23 0022 (!) 165/77     Girls Systolic BP Percentile --      Girls Diastolic BP Percentile --      Boys Systolic BP Percentile --      Boys Diastolic BP Percentile --      Pulse Rate 11/20/23 0022 70     Resp 11/20/23 0022 17     Temp 11/20/23 0022 97.8 F (36.6 C)     Temp src --      SpO2 11/20/23 0022 100 %     Weight 11/20/23 0023 155 lb (70.3 kg)     Height 11/20/23 0023 5' (1.524 m)     Head Circumference --      Peak Flow --      Pain Score 11/20/23 0023 10     Pain Loc --      Pain Education --      Exclude from Growth Chart --     Most recent vital signs: Vitals:   11/20/23 0022  BP: (!) 165/77  Pulse: 70  Resp: 17  Temp: 97.8 F (36.6 C)  SpO2: 100%    General: Awake, no distress.  CV:  Good peripheral perfusion.  Resp:  Normal effort.  Abd:  No distention.  Other:  Red warm left knee that is tender to palpation diffusely, and difficulty flexing due to pain.  Neurovascular intact distally, well-perfused distally.   ED Results / Procedures / Treatments   Labs (all labs  ordered are listed, but only abnormal results are displayed) Labs Reviewed  COMPREHENSIVE METABOLIC PANEL WITH GFR - Abnormal; Notable for the following components:      Result Value   Glucose, Bld 121 (*)    All other components within normal limits  SYNOVIAL CELL COUNT + DIFF, W/ CRYSTALS - Abnormal; Notable for the following components:   Color, Synovial RED (*)    Appearance-Synovial CLOUDY (*)    WBC, Synovial 16,623 (*)    All other components within normal limits  BODY FLUID CULTURE W GRAM STAIN  CBC WITH DIFFERENTIAL/PLATELET  GLUCOSE, BODY FLUID OTHER            PROTEIN, BODY FLUID (OTHER)  LACTATE DEHYDROGENASE     I ordered and reviewed the above labs they are notable for cell counts electrolytes unremarkable, no leukocytosis.  RADIOLOGY I independently reviewed and interpreted the knee x-ray and see no obvious fracture or dislocation I also reviewed radiologist's formal read.   PROCEDURES:  Critical Care performed: No  .Joint Aspiration/Arthrocentesis  Date/Time: 11/20/2023 4:30 AM  Performed by: Cyrena Mylar, MD Authorized by: Cyrena Mylar, MD   Consent:    Consent obtained:  Verbal   Consent given by:  Patient   Risks discussed:  Bleeding, infection, nerve damage and incomplete drainage   Alternatives discussed:  No treatment and delayed treatment Universal protocol:    Procedure explained and questions answered to patient or proxy's satisfaction: yes     Patient identity confirmed:  Verbally with patient Location:    Location:  Knee   Knee:  L knee Anesthesia:    Anesthesia method:  Local infiltration   Local anesthetic:  Lidocaine  1% WITH epi Procedure details:    Preparation: Patient was prepped and draped in usual sterile fashion     Needle gauge:  20 G   Ultrasound guidance: no     Approach:  Lateral   Aspirate amount:  10   Aspirate characteristics:  Blood-tinged   Steroid injected: no     Specimen collected: yes   Post-procedure details:     Procedure completion:  Tolerated    MEDICATIONS ORDERED IN ED: Medications  dexamethasone  (DECADRON ) injection 10 mg (10 mg Intra-articular Given 11/20/23 0252)  lidocaine -EPINEPHrine  (XYLOCAINE  W/EPI) 1 %-1:100000 (with pres) injection 20 mL (20 mLs Other Given 11/20/23 0251)  ketorolac  (TORADOL ) 30 MG/ML injection 15 mg (15 mg Intramuscular Given 11/20/23 0251)  ondansetron  (ZOFRAN -ODT) disintegrating tablet 4 mg (4 mg Oral Given 11/20/23 0435)  acetaminophen  (TYLENOL ) tablet 650 mg (650 mg Oral Given 11/20/23 0434)     IMPRESSION / MDM / ASSESSMENT AND PLAN / ED COURSE  I reviewed the triage vital signs and the nursing notes.                             Patient's presentation is most consistent with acute presentation with potential threat to life or bodily function.  Differential diagnosis includes, but is not limited to, inflammatory arthritis, septic joint, considered but less likely traumatic injury   The patient is on the cardiac monitor to evaluate for evidence of arrhythmia and/or significant heart rate changes.  MDM:    Most likely inflammatory arthritis as she has had a significant history in the past with a flareup that is quite severe today, but clinical exam does show findings that overlap with septic arthritis as she has a swollen red joint that is difficult to range, albeit these presentations are similar to her inflammatory arthritis presentations in the past that get frequent steroid injections from her primary doctor.  Fortunately she does not exhibit any systemic signs of infection like fever, leukocytosis.  Joint aspiration was difficult and had assistance from  Dr. Claudene who was able to obtain a small amount of joint fluid to send for analysis.  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      FINAL CLINICAL IMPRESSION(S) / ED DIAGNOSES   Final diagnoses:  Chronic pain of left knee  Arthritis of knee, left     Rx / DC Orders   ED Discharge Orders     None        Note:  This document was prepared using Dragon voice recognition software and may include unintentional dictation errors.    Cyrena Mylar, MD 11/20/23 [phone]  Cyrena Mylar, MD 11/20/23 [phone]

## 2023-11-20 NOTE — Discharge Instructions (Addendum)
 Fortunately your test results did not look like a infected joint.  Please follow-up with your primary doctor this week.  You may call Dr. Edie for an appointment as well as he is a knee specialist that can be very helpful for further treatment options.  Thank you for choosing us  for your health care today!  Please see your primary doctor this week for a follow up appointment.   If you have any new, worsening, or unexpected symptoms call your doctor right away or come back to the emergency department for reevaluation.  It was my pleasure to care for you today.   Ginnie EDISON Cyrena, MD

## 2023-11-20 NOTE — ED Notes (Signed)
 Patient wheeled out to car with son. Provided with discharge instructions including importance of rest and ice for knee as well as follow up appt with orthopedic surgery with stated understanding.

## 2023-11-20 NOTE — ED Triage Notes (Addendum)
 Pt to ED via EMS, pt c/o left knee pain/swelling and warm to the touch since last month. Pt states pain and swelling has made it difficult to walk. No redness noted. Pt denies injury or trauma to knee.

## 2023-11-21 LAB — GLUCOSE, BODY FLUID OTHER: Glucose, Body Fluid Other: 45 mg/dL

## 2023-11-21 LAB — PROTEIN, BODY FLUID (OTHER): Total Protein, Body Fluid Other: 3 g/dL

## 2023-11-23 LAB — BODY FLUID CULTURE W GRAM STAIN: Culture: NO GROWTH

## 2023-11-26 DIAGNOSIS — H2512 Age-related nuclear cataract, left eye: Secondary | ICD-10-CM | POA: Diagnosis not present

## 2023-11-26 DIAGNOSIS — Q130 Coloboma of iris: Secondary | ICD-10-CM | POA: Diagnosis not present

## 2023-11-26 DIAGNOSIS — H353112 Nonexudative age-related macular degeneration, right eye, intermediate dry stage: Secondary | ICD-10-CM | POA: Diagnosis not present

## 2023-11-26 DIAGNOSIS — Z961 Presence of intraocular lens: Secondary | ICD-10-CM | POA: Diagnosis not present

## 2023-11-26 LAB — HM DIABETES EYE EXAM

## 2023-11-27 ENCOUNTER — Ambulatory Visit (INDEPENDENT_AMBULATORY_CARE_PROVIDER_SITE_OTHER): Admitting: Internal Medicine

## 2023-11-27 ENCOUNTER — Encounter: Payer: Self-pay | Admitting: Internal Medicine

## 2023-11-27 VITALS — BP 128/74 | HR 77 | Ht 60.0 in | Wt 149.0 lb

## 2023-11-27 DIAGNOSIS — Z7984 Long term (current) use of oral hypoglycemic drugs: Secondary | ICD-10-CM | POA: Diagnosis not present

## 2023-11-27 DIAGNOSIS — M1712 Unilateral primary osteoarthritis, left knee: Secondary | ICD-10-CM

## 2023-11-27 DIAGNOSIS — E118 Type 2 diabetes mellitus with unspecified complications: Secondary | ICD-10-CM

## 2023-11-27 DIAGNOSIS — I10 Essential (primary) hypertension: Secondary | ICD-10-CM | POA: Diagnosis not present

## 2023-11-27 LAB — POCT GLYCOSYLATED HEMOGLOBIN (HGB A1C): Hemoglobin A1C: 6.2 % — AB (ref 4.0–5.6)

## 2023-11-27 MED ORDER — AMLODIPINE BESYLATE 5 MG PO TABS: 5.0000 mg | ORAL_TABLET | Freq: Every day | ORAL | 1 refills | Status: AC

## 2023-11-27 MED ORDER — CARVEDILOL 3.125 MG PO TABS: 3.1250 mg | ORAL_TABLET | Freq: Two times a day (BID) | ORAL | 1 refills | Status: AC

## 2023-11-27 MED ORDER — METFORMIN HCL ER 500 MG PO TB24: 1000.0000 mg | ORAL_TABLET | Freq: Two times a day (BID) | ORAL | 1 refills | Status: AC

## 2023-11-27 NOTE — Assessment & Plan Note (Signed)
 Blood pressure is well controlled on amlodipine  and coreg . No medication side effects noted. Plan to continue current medications.

## 2023-11-27 NOTE — Progress Notes (Signed)
 Date:  11/27/2023   Name:  Alison Warren   DOB:  December 07, 1945   MRN:  [id number]   Chief Complaint: Hypertension  Hypertension This is a chronic problem. The problem is controlled. Pertinent negatives include no chest pain, headaches, palpitations or shortness of breath.  Diabetes She presents for her follow-up diabetic visit. She has type 2 diabetes mellitus. Her disease course has been stable. Pertinent negatives for hypoglycemia include no dizziness, headaches or nervousness/anxiousness. Pertinent negatives for diabetes include no chest pain, no fatigue and no weakness.  Knee Pain  There was no injury mechanism. The pain is present in the left knee. The quality of the pain is described as burning and shooting. The pain is moderate. The pain has been Improving since onset.    Review of Systems  Constitutional:  Negative for fatigue and unexpected weight change.  HENT:  Positive for hearing loss. Negative for trouble swallowing.   Eyes:  Positive for visual disturbance.  Respiratory:  Negative for cough, chest tightness, shortness of breath and wheezing.   Cardiovascular:  Negative for chest pain, palpitations and leg swelling.  Gastrointestinal:  Negative for abdominal pain, constipation and diarrhea.  Musculoskeletal:  Positive for arthralgias, gait problem and joint swelling. Negative for myalgias.  Neurological:  Negative for dizziness, weakness, light-headedness and headaches.  Psychiatric/Behavioral:  Negative for dysphoric mood and sleep disturbance. The patient is not nervous/anxious.      Lab Results  Component Value Date   NA 138 11/20/2023   K 4.2 11/20/2023   CO2 25 11/20/2023   GLUCOSE 121 (H) 11/20/2023   BUN 18 11/20/2023   CREATININE 0.94 11/20/2023   CALCIUM  10.0 11/20/2023   EGFR 74 07/18/2023   GFRNONAA >60 11/20/2023   Lab Results  Component Value Date   CHOL 135 07/18/2023   HDL 63 07/18/2023   LDLCALC 52 07/18/2023   TRIG 109 07/18/2023   CHOLHDL  2.1 07/18/2023   Lab Results  Component Value Date   TSH 2.530 07/18/2023   Lab Results  Component Value Date   HGBA1C 6.2 (A) 11/27/2023   Lab Results  Component Value Date   WBC 8.5 11/20/2023   HGB 12.5 11/20/2023   HCT 39.0 11/20/2023   MCV 85.3 11/20/2023   PLT 226 11/20/2023   Lab Results  Component Value Date   ALT 31 11/20/2023   AST 35 11/20/2023   ALKPHOS 87 11/20/2023   BILITOT 0.8 11/20/2023   Lab Results  Component Value Date   VD25OH 40.6 07/26/2022     Patient Active Problem List   Diagnosis Date Noted   Sebaceous cyst 09/27/2023   Chest pain 09/19/2023   Other specified injury of long flexor muscle, fascia and tendon of right thumb at wrist and hand level, subsequent encounter 06/25/2023   Pruritic disorder 07/26/2022   Cirrhosis of liver without ascites, unspecified hepatic cirrhosis type (HCC) 07/05/2021   Benign essential hypertension 12/20/2020   Degenerative disc disease, cervical 03/23/2020   Chronic bilateral low back pain with bilateral sciatica 02/12/2020   Lumbar spondylosis with myelopathy 02/09/2020   Aortic atherosclerosis (HCC) 01/19/2020   GERD (gastroesophageal reflux disease) 11/21/2018   Hyperlipidemia associated with type 2 diabetes mellitus (HCC) 11/21/2018   Primary osteoarthritis of left knee 05/09/2018   B12 deficiency 04/30/2018   Obesity (BMI 30.0-34.9) 09/14/2017   Hepatic steatosis 02/26/2017   Diverticulitis of colon 09/22/2016   Vitamin D  deficiency 09/01/2016   Type II diabetes mellitus with complication (HCC) 08/24/2016  Hx of adenomatous colonic polyps 02/24/2016   NAFLD (nonalcoholic fatty liver disease) 90/92/7982   Osteopenia of multiple sites 09/09/2015   Seasonal allergic rhinitis due to pollen 08/24/2015   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/2015   Abnormal mammogram with microcalcification 07/02/2014   Muscle cramp, nocturnal 08/20/2013   Hearing loss of both ears 08/19/2013   Blindness of left eye with  normal vision in contralateral eye 05/14/2006    Allergies  Allergen Reactions   Tramadol  Nausea And Vomiting   Motrin [Ibuprofen] Rash    Past Surgical History:  Procedure Laterality Date   CATARACT EXTRACTION W/PHACO Right 12/07/2014   Procedure: CATARACT EXTRACTION PHACO AND INTRAOCULAR LENS PLACEMENT (IOC);  Surgeon: Donzell Arlyce Budd, MD;  Location[facility];  Service: Ophthalmology;  Laterality: Right;  DIABETIC - oral meds   CHOLECYSTECTOMY  2007   COLONOSCOPY N/A 02/08/2016   Procedure: COLONOSCOPY;  Surgeon: Gladis RAYMOND Mariner, MD;  Location: [facility] END[facility]COPY;  Service: Endoscopy;  Laterality: N/A;  Diabetes   MASTOIDECTOMY     THUMB AMPUTATION     removal of 2nd thumb   TONSILLECTOMY      Social History   Tobacco Use   Smoking status: Never   Smokeless tobacco: Never  Vaping Use   Vaping status: Never Used  Substance Use Topics   Alcohol use: No   Drug use: No     Medication list has been reviewed and updated.  Current Meds  Medication Sig   aspirin  81 MG tablet Take 81 mg by mouth daily. AM   atorvastatin  (LIPITOR) 10 MG tablet TAKE 1 TABLET BY MOUTH EVERY DAY   clotrimazole -betamethasone  (LOTRISONE ) cream Apply 1 Application topically daily.   DULoxetine  (CYMBALTA ) 20 MG capsule Take 2 capsules (40 mg total) by mouth daily.   ergocalciferol  (VITAMIN D2) 1.25 MG (50000 UT) capsule Take 50,000 Units by mouth once a week.   ezetimibe  (ZETIA ) 10 MG tablet TAKE 1 TABLET BY MOUTH EVERY DAY   glucose blood (ONETOUCH ULTRA) test strip Use once daily   Lancets (ONETOUCH DELICA PLUS LANCET33G) MISC 1 EACH BY DOES NOT APPLY ROUTE SEE ADMIN INSTRUCTIONS.   omeprazole  (PRILOSEC) 40 MG capsule TAKE 1 CAPSULE (40 MG TOTAL) BY MOUTH DAILY.   pantoprazole  (PROTONIX ) 40 MG tablet TAKE 1 TABLET (40 MG TOTAL) BY MOUTH TWICE A DAY BEFORE MEALS   [DISCONTINUED] amLODipine  (NORVASC ) 5 MG tablet TAKE 1 TABLET (5 MG TOTAL) BY MOUTH DAILY.   [DISCONTINUED]  carvedilol  (COREG ) 3.125 MG tablet TAKE 1 TABLET BY MOUTH TWICE A DAY WITH FOOD   [DISCONTINUED] metFORMIN  (GLUCOPHAGE -XR) 500 MG 24 hr tablet TAKE 2 TABLETS (1,000 MG TOTAL) BY MOUTH IN THE MORNING AND AT BEDTIME.       11/27/2023    1:51 PM 09/27/2023   10:36 AM 07/18/2023   10:17 AM 11/14/2022    8:22 AM  GAD 7 : Generalized Anxiety Score  Nervous, Anxious, on Edge 0 0 0 0  Control/stop worrying 0 0 0 0  Worry too much - different things 0 0 0 0  Trouble relaxing 0 0 0 0  Restless 0 0 0 0  Easily annoyed or irritable 0 0 0 0  Afraid - awful might happen 0 0 0 0  Total GAD 7 Score 0 0 0 0  Anxiety Difficulty Not difficult at all Not difficult at all Not difficult at all Not difficult at all       11/27/2023    1:51 PM 09/27/2023  10:36 AM 07/18/2023   10:17 AM  Depression screen PHQ 2/9  Decreased Interest 0 0 0  Down, Depressed, Hopeless 0 0 0  PHQ - 2 Score 0 0 0  Altered sleeping 0 0 0  Tired, decreased energy 0 0 0  Change in appetite 0 0 0  Feeling bad or failure about yourself  0 0 0  Trouble concentrating 0 0 0  Moving slowly or fidgety/restless 0 0 0  Suicidal thoughts 0 0 0  PHQ-9 Score 0 0 0  Difficult doing work/chores Not difficult at all Not difficult at all Not difficult at all    BP Readings from Last 3 Encounters:  11/27/23 128/74  11/20/23 (!) 143/87  10/30/23 118/64    Physical Exam Vitals and nursing note reviewed.  Constitutional:      General: She is not in acute distress.    Appearance: She is well-developed.  HENT:     Head: Normocephalic and atraumatic.  Cardiovascular:     Rate and Rhythm: Normal rate and regular rhythm.     Heart sounds: No murmur heard. Pulmonary:     Effort: Pulmonary effort is normal. No respiratory distress.     Breath sounds: No wheezing or rhonchi.  Musculoskeletal:        General: Swelling and tenderness present.     Cervical back: Normal range of motion.     Right lower leg: No edema.     Left lower leg: No  edema.  Lymphadenopathy:     Cervical: No cervical adenopathy.  Skin:    General: Skin is warm and dry.     Findings: No rash. Lesion: abrasion on lateral right knee with eschar and minor bleeding. Neurological:     Mental Status: She is alert and oriented to person, place, and time.     Gait: Gait abnormal (walks with a cane).     Comments: Poor vision and hearing  Psychiatric:        Mood and Affect: Mood normal.        Behavior: Behavior normal.     Wt Readings from Last 3 Encounters:  11/27/23 149 lb (67.6 kg)  11/20/23 155 lb (70.3 kg)  10/30/23 152 lb 12.8 oz (69.3 kg)    BP 128/74   Pulse 77   Ht 5' (1.524 m)   Wt 149 lb (67.6 kg)   SpO2 97%   BMI 29.10 kg/m   Assessment and Plan:  Problem List Items Addressed This Visit       Unprioritized   Type II diabetes mellitus with complication (HCC) (Chronic)   Blood sugars have been stable.  No hypoglycemic events since last visit. Currently medications are MTF. Last visit medical regimen changes were none. Lab Results  Component Value Date   HGBA1C 6.9 (H) 07/18/2023  A1C today = 6.2 No change in medications made today.       Relevant Medications   metFORMIN  (GLUCOPHAGE -XR) 500 MG 24 hr tablet   Other Relevant Orders   POCT glycosylated hemoglobin (Hb A1C) (Completed)   Primary osteoarthritis of left knee   Recently in ED - had joint fluid aspirated and steroid injected She is feeling some better but may need knee replacement Reviewed the fluid cultures - negative to date. She has Ortho appointment with Dr. Edie next week to discuss options      Benign essential hypertension (Chronic)   Blood pressure is well controlled on amlodipine  and coreg . No medication side effects noted. Plan to  continue current medications.       Relevant Medications   amLODipine  (NORVASC ) 5 MG tablet   carvedilol  (COREG ) 3.125 MG tablet   Other Visit Diagnoses       Long term current use of oral hypoglycemic drug    -   Primary       Return in about 4 months (around 03/28/2024) for DM, HTN.    Leita HILARIO Adie, MD [facility] [place]

## 2023-11-27 NOTE — Assessment & Plan Note (Addendum)
 Recently in ED - had joint fluid aspirated and steroid injected She is feeling some better but may need knee replacement Reviewed the fluid cultures - negative to date. She has Ortho appointment with Dr. Edie next week to discuss options

## 2023-11-27 NOTE — Assessment & Plan Note (Signed)
 Blood sugars have been stable.  No hypoglycemic events since last visit. Currently medications are MTF. Last visit medical regimen changes were none. Lab Results  Component Value Date   HGBA1C 6.9 (H) 07/18/2023  A1C today = 6.2 No change in medications made today.

## 2023-11-30 ENCOUNTER — Ambulatory Visit: Admitting: Internal Medicine

## 2023-12-04 DIAGNOSIS — M1712 Unilateral primary osteoarthritis, left knee: Secondary | ICD-10-CM | POA: Diagnosis not present

## 2023-12-05 DIAGNOSIS — M1712 Unilateral primary osteoarthritis, left knee: Secondary | ICD-10-CM | POA: Diagnosis not present

## 2023-12-05 DIAGNOSIS — M2392 Unspecified internal derangement of left knee: Secondary | ICD-10-CM | POA: Diagnosis not present

## 2023-12-05 DIAGNOSIS — E1169 Type 2 diabetes mellitus with other specified complication: Secondary | ICD-10-CM | POA: Diagnosis not present

## 2023-12-06 ENCOUNTER — Other Ambulatory Visit: Payer: Self-pay | Admitting: Orthopedic Surgery

## 2023-12-06 DIAGNOSIS — M2392 Unspecified internal derangement of left knee: Secondary | ICD-10-CM

## 2023-12-06 DIAGNOSIS — M1712 Unilateral primary osteoarthritis, left knee: Secondary | ICD-10-CM

## 2023-12-07 ENCOUNTER — Ambulatory Visit
Admission: RE | Admit: 2023-12-07 | Discharge: 2023-12-07 | Disposition: A | Discharge status: Home / Self Care | Source: Ambulatory Visit | Attending: Orthopedic Surgery | Admitting: Orthopedic Surgery

## 2023-12-07 DIAGNOSIS — M1712 Unilateral primary osteoarthritis, left knee: Secondary | ICD-10-CM

## 2023-12-07 DIAGNOSIS — M25462 Effusion, left knee: Secondary | ICD-10-CM | POA: Diagnosis not present

## 2023-12-07 DIAGNOSIS — M2392 Unspecified internal derangement of left knee: Secondary | ICD-10-CM

## 2023-12-07 DIAGNOSIS — M23252 Derangement of posterior horn of lateral meniscus due to old tear or injury, left knee: Secondary | ICD-10-CM | POA: Diagnosis not present

## 2023-12-13 DIAGNOSIS — I10 Essential (primary) hypertension: Secondary | ICD-10-CM | POA: Diagnosis not present

## 2023-12-13 DIAGNOSIS — E785 Hyperlipidemia, unspecified: Secondary | ICD-10-CM | POA: Diagnosis not present

## 2023-12-13 DIAGNOSIS — Z7984 Long term (current) use of oral hypoglycemic drugs: Secondary | ICD-10-CM | POA: Diagnosis not present

## 2023-12-13 DIAGNOSIS — E119 Type 2 diabetes mellitus without complications: Secondary | ICD-10-CM | POA: Diagnosis not present

## 2023-12-19 DIAGNOSIS — Z7984 Long term (current) use of oral hypoglycemic drugs: Secondary | ICD-10-CM | POA: Diagnosis not present

## 2023-12-19 DIAGNOSIS — E785 Hyperlipidemia, unspecified: Secondary | ICD-10-CM | POA: Diagnosis not present

## 2023-12-19 DIAGNOSIS — E119 Type 2 diabetes mellitus without complications: Secondary | ICD-10-CM | POA: Diagnosis not present

## 2023-12-19 DIAGNOSIS — M1712 Unilateral primary osteoarthritis, left knee: Secondary | ICD-10-CM | POA: Diagnosis not present

## 2023-12-19 DIAGNOSIS — I10 Essential (primary) hypertension: Secondary | ICD-10-CM | POA: Diagnosis not present

## 2023-12-28 DIAGNOSIS — E785 Hyperlipidemia, unspecified: Secondary | ICD-10-CM | POA: Diagnosis not present

## 2023-12-28 DIAGNOSIS — I10 Essential (primary) hypertension: Secondary | ICD-10-CM | POA: Diagnosis not present

## 2023-12-28 DIAGNOSIS — Z7984 Long term (current) use of oral hypoglycemic drugs: Secondary | ICD-10-CM | POA: Diagnosis not present

## 2023-12-28 DIAGNOSIS — E119 Type 2 diabetes mellitus without complications: Secondary | ICD-10-CM | POA: Diagnosis not present

## 2024-01-02 DIAGNOSIS — E785 Hyperlipidemia, unspecified: Secondary | ICD-10-CM | POA: Diagnosis not present

## 2024-01-02 DIAGNOSIS — E119 Type 2 diabetes mellitus without complications: Secondary | ICD-10-CM | POA: Diagnosis not present

## 2024-01-02 DIAGNOSIS — Z7984 Long term (current) use of oral hypoglycemic drugs: Secondary | ICD-10-CM | POA: Diagnosis not present

## 2024-01-02 DIAGNOSIS — M1712 Unilateral primary osteoarthritis, left knee: Secondary | ICD-10-CM | POA: Diagnosis not present

## 2024-01-02 DIAGNOSIS — I10 Essential (primary) hypertension: Secondary | ICD-10-CM | POA: Diagnosis not present

## 2024-01-22 ENCOUNTER — Other Ambulatory Visit: Payer: Self-pay | Admitting: Internal Medicine

## 2024-01-22 DIAGNOSIS — K219 Gastro-esophageal reflux disease without esophagitis: Secondary | ICD-10-CM

## 2024-01-24 NOTE — Telephone Encounter (Signed)
 Requested medications are due for refill today.  unsure  Requested medications are on the active medications list.  yes  Last refill. 10/22/2023 #180 0 rf  Future visit scheduled.   Next year a wellness visit is scheduled  Notes to clinic.  Pt has 2 PPIs on med list.    Requested Prescriptions  Pending Prescriptions Disp Refills   pantoprazole  (PROTONIX ) 40 MG tablet [Pharmacy Med Name: PANTOPRAZOLE  SOD DR 40 MG TAB] 180 tablet 0    Sig: TAKE 1 TABLET (40 MG TOTAL) BY MOUTH TWICE A DAY BEFORE MEALS     Gastroenterology: Proton Pump Inhibitors Passed - 01/24/2024 11:07 AM      Passed - Valid encounter within last 12 months    Recent Outpatient Visits           1 month ago Long term current use of oral hypoglycemic drug   [place] [facility] at [facility], Leita DEL, MD   2 months ago Primary osteoarthritis of left knee   [facility], Selinda PARAS, MD   3 months ago Other chest pain   [place] [facility] at [facility], Leita DEL, MD   4 months ago Primary osteoarthritis of left knee   [facility] at [facility], Selinda PARAS, MD   6 months ago Annual physical exam   [facility] at [facility], Leita DEL, MD

## 2024-01-25 ENCOUNTER — Other Ambulatory Visit: Payer: Self-pay | Admitting: Family Medicine

## 2024-01-25 DIAGNOSIS — M1712 Unilateral primary osteoarthritis, left knee: Secondary | ICD-10-CM

## 2024-01-28 NOTE — Telephone Encounter (Signed)
 Requested Prescriptions  Pending Prescriptions Disp Refills   DULoxetine  (CYMBALTA ) 20 MG capsule [Pharmacy Med Name: DULOXETINE  HCL DR 20 MG CAP] 180 capsule 0    Sig: TAKE 2 CAPSULES BY MOUTH EVERY DAY     Psychiatry: Antidepressants - SNRI - duloxetine  Passed - 01/28/2024  9:29 AM      Passed - Cr in normal range and within 360 days    Creatinine  Date Value Ref Range Status  09/07/2013 0.89 0.60 - 1.30 mg/dL Final   Creatinine, Ser  Date Value Ref Range Status  11/20/2023 0.94 0.44 - 1.00 mg/dL Final         Passed - eGFR is 30 or above and within 360 days    EGFR (African American)  Date Value Ref Range Status  09/07/2013 >60  Final   GFR calc Af Amer  Date Value Ref Range Status  10/18/2016 >60 >60 mL/min Final    Comment:    (NOTE) The eGFR has been calculated using the CKD EPI equation. This calculation has not been validated in all clinical situations. eGFR's persistently <60 mL/min signify possible Chronic Kidney Disease.    EGFR (Non-African Amer.)  Date Value Ref Range Status  09/07/2013 >60  Final    Comment:    eGFR values <8mL/min/1.73 m2 may be an indication of chronic kidney disease (CKD). Calculated eGFR is useful in patients with stable renal function. The eGFR calculation will not be reliable in acutely ill patients when serum creatinine is changing rapidly. It is not useful in  patients on dialysis. The eGFR calculation may not be applicable to patients at the low and high extremes of body sizes, pregnant women, and vegetarians.    GFR, Estimated  Date Value Ref Range Status  11/20/2023 >60 >60 mL/min Final    Comment:    (NOTE) Calculated using the CKD-EPI Creatinine Equation (2021)    eGFR  Date Value Ref Range Status  07/18/2023 74 >59 mL/min/1.73 Final         Passed - Completed PHQ-2 or PHQ-9 in the last 360 days      Passed - Last BP in normal range    BP Readings from Last 1 Encounters:  11/27/23 128/74         Passed -  Valid encounter within last 6 months    Recent Outpatient Visits           2 months ago Long term current use of oral hypoglycemic drug   [place] [facility] at [facility], Leita DEL, MD   3 months ago Primary osteoarthritis of left knee   [facility] at [facility], Selinda PARAS, MD   4 months ago Other chest pain   [place] [facility] at [facility], Leita DEL, MD   5 months ago Primary osteoarthritis of left knee   [facility] at [facility], Selinda PARAS, MD   6 months ago Annual physical exam   [facility] at [facility], Leita DEL, MD

## 2024-01-30 ENCOUNTER — Ambulatory Visit: Admitting: Family Medicine

## 2024-01-31 ENCOUNTER — Ambulatory Visit: Admitting: Podiatry

## 2024-02-14 ENCOUNTER — Encounter: Payer: Self-pay | Admitting: Podiatry

## 2024-02-14 ENCOUNTER — Ambulatory Visit: Admitting: Podiatry

## 2024-02-14 DIAGNOSIS — L84 Corns and callosities: Secondary | ICD-10-CM | POA: Diagnosis not present

## 2024-02-14 DIAGNOSIS — B351 Tinea unguium: Secondary | ICD-10-CM

## 2024-02-14 DIAGNOSIS — M79675 Pain in left toe(s): Secondary | ICD-10-CM

## 2024-02-14 DIAGNOSIS — E1142 Type 2 diabetes mellitus with diabetic polyneuropathy: Secondary | ICD-10-CM

## 2024-02-14 DIAGNOSIS — M79674 Pain in right toe(s): Secondary | ICD-10-CM

## 2024-02-24 NOTE — Progress Notes (Signed)
  Subjective:  Patient ID: Alison Warren, female    DOB: 1945/05/27,  MRN: [id number]  Alison Warren presents to clinic today for callus(es) right foot and painful mycotic toenails that are difficult to trim. Painful toenails interfere with ambulation. Aggravating factors include wearing enclosed shoe gear. Pain is relieved with periodic professional debridement. Painful calluses are aggravated when weightbearing with and without shoegear. Pain is relieved with periodic professional debridement.  Chief Complaint  Patient presents with   Nail Problem    Thick painful toenails, 3 month follow up    New problem(s): None.   PCP is Justus Leita DEL, MD. Alison Warren 11/27/2023.  Allergies  Allergen Reactions   Tramadol  Nausea And Vomiting   Motrin [Ibuprofen] Rash    Review of Systems: Negative except as noted in the HPI.  Objective:  There were no vitals filed for this visit. Alison Warren is a pleasant 78 y.o. female WD, WN in NAD. AAO x 3.  Vascular Examination: Vascular status intact b/l with palpable pedal pulses. CFT immediate b/l. No edema. No pain with calf compression b/l. Skin temperature gradient WNL b/l. Pedal hair absent.  Neurological Examination: Protective sensation diminished with 10g monofilament b/l.  Dermatological Examination: Pedal skin with normal turgor, texture and tone b/l. Toenails 1-5 left, 1, 2, 5 right  thick, discolored, elongated with subungual debris and pain on dorsal palpation.   Hyperkeratotic lesion(s) submet head 5 right foot.  No erythema, no edema, no drainage, no fluctuance.  Musculoskeletal Examination: Muscle strength 5/5 to b/l LE. Oligodactyly of right foot.  Radiographs: None  Assessment/Plan: 1. Pain due to onychomycosis of toenails of both feet   2. Callus   3. Diabetic peripheral neuropathy associated with type 2 diabetes mellitus [facility])   Consent given for treatment. Patient examined. All patient's and/or POA's questions/concerns  addressed on today's visit. Mycotic toenails 1-5 b/l  debrided in length and girth without incident. Callus(es) submet head 5 right foot pared with sharp debridement without incident. Continue foot and shoe inspections daily. Monitor blood glucose per PCP/Endocrinologist's recommendations.Continue soft, supportive shoe gear daily. Report any pedal injuries to medical professional. Call office if there are any quesitons/concerns.  Return in about 3 months (around 05/16/2024).  Delon LITTIE Merlin, DPM      [place] LOCATION: [address] [place] [address]                   Office [phone]   [facility] LOCATION: [address] [place], [place] [address] Office [phone]

## 2024-02-27 ENCOUNTER — Telehealth: Payer: Self-pay

## 2024-02-27 NOTE — Telephone Encounter (Signed)
 Copied from CRM [id number]. Topic: Clinical - Medical Advice >> Feb 27, 2024 11:18 AM Charolett L wrote: Reason for CRM: Home health aide is calling to verify if patient can use valtaren cream for arthritis and is requesting a call back to verify CB# [phone] Secured vm to leave a message

## 2024-02-28 NOTE — Telephone Encounter (Signed)
 Left secured VM informing.

## 2024-03-31 ENCOUNTER — Ambulatory Visit: Admitting: Internal Medicine

## 2024-04-01 ENCOUNTER — Other Ambulatory Visit: Payer: Self-pay | Admitting: Orthopedic Surgery

## 2024-04-08 ENCOUNTER — Ambulatory Visit (INDEPENDENT_AMBULATORY_CARE_PROVIDER_SITE_OTHER): Admitting: Internal Medicine

## 2024-04-08 ENCOUNTER — Encounter: Payer: Self-pay | Admitting: Internal Medicine

## 2024-04-08 VITALS — BP 110/60 | HR 76 | Ht 60.0 in | Wt 135.0 lb

## 2024-04-08 DIAGNOSIS — I10 Essential (primary) hypertension: Secondary | ICD-10-CM | POA: Diagnosis not present

## 2024-04-08 DIAGNOSIS — E118 Type 2 diabetes mellitus with unspecified complications: Secondary | ICD-10-CM | POA: Diagnosis not present

## 2024-04-08 DIAGNOSIS — Z7984 Long term (current) use of oral hypoglycemic drugs: Secondary | ICD-10-CM | POA: Diagnosis not present

## 2024-04-08 DIAGNOSIS — M1712 Unilateral primary osteoarthritis, left knee: Secondary | ICD-10-CM

## 2024-04-08 LAB — POCT GLYCOSYLATED HEMOGLOBIN (HGB A1C): Hemoglobin A1C: 7 % — AB (ref 4.0–5.6)

## 2024-04-08 NOTE — Assessment & Plan Note (Signed)
 Currently medications are MTF.  No hypoglycemic episodes noted. Last visit medical regimen changes were none. Lab Results  Component Value Date   HGBA1C 6.2 (A) 11/27/2023  A1C = 7.0 Will continue the same medications.

## 2024-04-08 NOTE — Assessment & Plan Note (Signed)
 Cleared medically for TKA next month. Recommend she ask for short term rehab stay since she should not go home alone

## 2024-04-08 NOTE — Progress Notes (Signed)
 "   Date:  04/08/2024   Name:  Alison Warren   DOB:  [birth year]   MRN:  [id number]   Chief Complaint: Hypertension and Diabetes  Hypertension This is a chronic problem. The problem is controlled. Pertinent negatives include no chest pain or shortness of breath.  Diabetes She presents for her follow-up diabetic visit. She has type 2 diabetes mellitus. Pertinent negatives for hypoglycemia include no nervousness/anxiousness. Pertinent negatives for diabetes include no chest pain and no fatigue.    Review of Systems  Constitutional:  Negative for appetite change, diaphoresis, fatigue and unexpected weight change (has been cutting back on intake and lost some weight to help her knees).  Respiratory:  Negative for chest tightness and shortness of breath.   Cardiovascular:  Negative for chest pain.  Musculoskeletal:  Positive for arthralgias (severe left knee pain).  Psychiatric/Behavioral:  Negative for dysphoric mood and sleep disturbance. The patient is not nervous/anxious.      Lab Results  Component Value Date   NA 138 11/20/2023   K 4.2 11/20/2023   CO2 25 11/20/2023   GLUCOSE 121 (H) 11/20/2023   BUN 18 11/20/2023   CREATININE 0.94 11/20/2023   CALCIUM  10.0 11/20/2023   EGFR 74 07/18/2023   GFRNONAA >60 11/20/2023   Lab Results  Component Value Date   CHOL 135 07/18/2023   HDL 63 07/18/2023   LDLCALC 52 07/18/2023   TRIG 109 07/18/2023   CHOLHDL 2.1 07/18/2023   Lab Results  Component Value Date   TSH 2.530 07/18/2023   Lab Results  Component Value Date   HGBA1C 7.0 (A) 04/08/2024   Lab Results  Component Value Date   WBC 8.5 11/20/2023   HGB 12.5 11/20/2023   HCT 39.0 11/20/2023   MCV 85.3 11/20/2023   PLT 226 11/20/2023   Lab Results  Component Value Date   ALT 31 11/20/2023   AST 35 11/20/2023   ALKPHOS 87 11/20/2023   BILITOT 0.8 11/20/2023   Lab Results  Component Value Date   VD25OH 40.6 07/26/2022     Patient Active Problem List    Diagnosis Date Noted   Sebaceous cyst 09/27/2023   Chest pain 09/19/2023   Other specified injury of long flexor muscle, fascia and tendon of right thumb at wrist and hand level, subsequent encounter 06/25/2023   Pruritic disorder 07/26/2022   Cirrhosis of liver without ascites, unspecified hepatic cirrhosis type (HCC) 07/05/2021   Benign essential hypertension 12/20/2020   Degenerative disc disease, cervical 03/23/2020   Chronic bilateral low back pain with bilateral sciatica 02/12/2020   Lumbar spondylosis with myelopathy 02/09/2020   Aortic atherosclerosis 01/19/2020   GERD (gastroesophageal reflux disease) 11/21/2018   Hyperlipidemia associated with type 2 diabetes mellitus (HCC) 11/21/2018   Primary osteoarthritis of left knee 05/09/2018   B12 deficiency 04/30/2018   Obesity (BMI 30.0-34.9) 09/14/2017   Hepatic steatosis 02/26/2017   Diverticulitis of colon 09/22/2016   Vitamin D  deficiency 09/01/2016   Type II diabetes mellitus with complication (HCC) 08/24/2016   Hx of adenomatous colonic polyps 02/24/2016   NAFLD (nonalcoholic fatty liver disease) 90/92/7982   Osteopenia of multiple sites 09/09/2015   Seasonal allergic rhinitis due to pollen 08/24/2015   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/2015   Abnormal mammogram with microcalcification 07/02/2014   Muscle cramp, nocturnal 08/20/2013   Hearing loss of both ears 08/19/2013   Blindness of left eye with normal vision in contralateral eye 05/14/2006    Allergies[1]  Past Surgical History:  Procedure  Laterality Date   CATARACT EXTRACTION W/PHACO Right 12/07/2014   Procedure: CATARACT EXTRACTION PHACO AND INTRAOCULAR LENS PLACEMENT (IOC);  Surgeon: Donzell Arlyce Budd, MD;  Location[facility];  Service: Ophthalmology;  Laterality: Right;  DIABETIC - oral meds   CHOLECYSTECTOMY  2007   COLONOSCOPY N/A 02/08/2016   Procedure: COLONOSCOPY;  Surgeon: Gladis RAYMOND Mariner, MD;  Location: [facility] END[facility]COPY;  Service:  Endoscopy;  Laterality: N/A;  Diabetes   MASTOIDECTOMY     THUMB AMPUTATION     removal of 2nd thumb   TONSILLECTOMY      Social History[2]   Medication list has been reviewed and updated.  Active Medications[3]     04/08/2024    1:46 PM 11/27/2023    1:51 PM 09/27/2023   10:36 AM 07/18/2023   10:17 AM  GAD 7 : Generalized Anxiety Score  Nervous, Anxious, on Edge 0 0 0 0  Control/stop worrying 0 0 0 0  Worry too much - different things  0 0 0  Trouble relaxing  0 0 0  Restless  0 0 0  Easily annoyed or irritable  0 0 0  Afraid - awful might happen  0 0 0  Total GAD 7 Score  0 0 0  Anxiety Difficulty  Not difficult at all Not difficult at all Not difficult at all       04/08/2024    1:46 PM 11/27/2023    1:51 PM 09/27/2023   10:36 AM  Depression screen PHQ 2/9  Decreased Interest 0 0 0  Down, Depressed, Hopeless 0 0 0  PHQ - 2 Score 0 0 0  Altered sleeping  0 0  Tired, decreased energy  0 0  Change in appetite  0 0  Feeling bad or failure about yourself   0 0  Trouble concentrating  0 0  Moving slowly or fidgety/restless  0 0  Suicidal thoughts  0 0  PHQ-9 Score  0  0   Difficult doing work/chores  Not difficult at all Not difficult at all     Data saved with a previous flowsheet row definition    BP Readings from Last 3 Encounters:  04/08/24 110/60  11/27/23 128/74  11/20/23 (!) 143/87    Physical Exam Constitutional:      Appearance: Normal appearance.  Cardiovascular:     Rate and Rhythm: Normal rate and regular rhythm.  Pulmonary:     Effort: No respiratory distress.     Breath sounds: No wheezing or rhonchi.  Musculoskeletal:        General: Tenderness (left knee) present.     Cervical back: Normal range of motion.     Right lower leg: No edema.     Left lower leg: No edema.  Lymphadenopathy:     Cervical: No cervical adenopathy.  Skin:    General: Skin is warm and dry.  Neurological:     General: No focal deficit present.     Mental  Status: She is alert.     Gait: Gait abnormal (uses cane to ambulate).     Wt Readings from Last 3 Encounters:  04/08/24 135 lb (61.2 kg)  11/27/23 149 lb (67.6 kg)  11/20/23 155 lb (70.3 kg)    BP 110/60   Pulse 76   Ht 5' (1.524 m)   Wt 135 lb (61.2 kg)   SpO2 97%   BMI 26.37 kg/m   Assessment and Plan:  Problem List Items Addressed This Visit  Unprioritized   Type II diabetes mellitus with complication (HCC) - Primary (Chronic)   Currently medications are MTF.  No hypoglycemic episodes noted. Last visit medical regimen changes were none. Lab Results  Component Value Date   HGBA1C 6.2 (A) 11/27/2023  A1C = 7.0 Will continue the same medications.       Relevant Orders   POCT glycosylated hemoglobin (Hb A1C) (Completed)   Primary osteoarthritis of left knee   Cleared medically for TKA next month. Recommend she ask for short term rehab stay since she should not go home alone      Relevant Medications   celecoxib (CELEBREX) 200 MG capsule   Benign essential hypertension (Chronic)   BP controlled on amlodipine  and coreg  No chest pain or shortness of breath       Other Visit Diagnoses       Long term current use of oral hypoglycemic drug           Return in about 4 months (around 08/07/2024) for DM, HTN.    Leita HILARIO Adie, MD [facility] [place]           [1]  Allergies Allergen Reactions   Tramadol  Nausea And Vomiting   Motrin [Ibuprofen] Rash  [2]  Social History Tobacco Use   Smoking status: Never   Smokeless tobacco: Never  Vaping Use   Vaping status: Never Used  Substance Use Topics   Alcohol use: No   Drug use: No  [3]  Current Meds  Medication Sig   amLODipine  (NORVASC ) 5 MG tablet Take 1 tablet (5 mg total) by mouth daily.   atorvastatin  (LIPITOR) 10 MG tablet TAKE 1 TABLET BY MOUTH EVERY DAY   carvedilol  (COREG ) 3.125 MG tablet Take 1 tablet (3.125 mg total) by mouth 2 (two) times  daily with a meal.   celecoxib (CELEBREX) 200 MG capsule Take 200 mg by mouth daily.   clotrimazole -betamethasone  (LOTRISONE ) cream Apply 1 Application topically daily.   DULoxetine  (CYMBALTA ) 20 MG capsule TAKE 2 CAPSULES BY MOUTH EVERY DAY   ergocalciferol  (VITAMIN D2) 1.25 MG (50000 UT) capsule Take 50,000 Units by mouth once a week.   ezetimibe  (ZETIA ) 10 MG tablet TAKE 1 TABLET BY MOUTH EVERY DAY   glucose blood (ONETOUCH ULTRA) test strip Use once daily   Lancets (ONETOUCH DELICA PLUS LANCET33G) MISC 1 EACH BY DOES NOT APPLY ROUTE SEE ADMIN INSTRUCTIONS.   metFORMIN  (GLUCOPHAGE -XR) 500 MG 24 hr tablet Take 2 tablets (1,000 mg total) by mouth in the morning and at bedtime.   omeprazole  (PRILOSEC) 40 MG capsule TAKE 1 CAPSULE (40 MG TOTAL) BY MOUTH DAILY.   pantoprazole  (PROTONIX ) 40 MG tablet TAKE 1 TABLET (40 MG TOTAL) BY MOUTH TWICE A DAY BEFORE MEALS   "

## 2024-04-08 NOTE — Assessment & Plan Note (Signed)
 BP controlled on amlodipine  and coreg  No chest pain or shortness of breath

## 2024-04-17 ENCOUNTER — Other Ambulatory Visit: Payer: Self-pay

## 2024-04-17 ENCOUNTER — Encounter
Admission: RE | Admit: 2024-04-17 | Discharge: 2024-04-17 | Disposition: A | Discharge status: Home / Self Care | Source: Ambulatory Visit | Attending: Orthopedic Surgery | Admitting: Orthopedic Surgery

## 2024-04-17 VITALS — BP 143/77 | HR 65 | Resp 16 | Ht 60.0 in | Wt 154.0 lb

## 2024-04-17 DIAGNOSIS — Z01818 Encounter for other preprocedural examination: Secondary | ICD-10-CM | POA: Insufficient documentation

## 2024-04-17 LAB — CBC WITH DIFFERENTIAL/PLATELET
Abs Immature Granulocytes: 0.02 K/uL (ref 0.00–0.07)
Basophils Absolute: 0.1 K/uL (ref 0.0–0.1)
Basophils Relative: 1 %
Eosinophils Absolute: 0.1 K/uL (ref 0.0–0.5)
Eosinophils Relative: 2 %
HCT: 39.9 % (ref 36.0–46.0)
Hemoglobin: 12.4 g/dL (ref 12.0–15.0)
Immature Granulocytes: 0 %
Lymphocytes Relative: 37 %
Lymphs Abs: 2.7 K/uL (ref 0.7–4.0)
MCH: 25.3 pg — ABNORMAL LOW (ref 26.0–34.0)
MCHC: 31.1 g/dL (ref 30.0–36.0)
MCV: 81.3 fL (ref 80.0–100.0)
Monocytes Absolute: 0.5 K/uL (ref 0.1–1.0)
Monocytes Relative: 7 %
Neutro Abs: 3.9 K/uL (ref 1.7–7.7)
Neutrophils Relative %: 53 %
Platelets: 241 K/uL (ref 150–400)
RBC: 4.91 MIL/uL (ref 3.87–5.11)
RDW: 15.4 % (ref 11.5–15.5)
WBC: 7.3 K/uL (ref 4.0–10.5)
nRBC: 0 % (ref 0.0–0.2)

## 2024-04-17 LAB — URINALYSIS, ROUTINE W REFLEX MICROSCOPIC
Bilirubin Urine: NEGATIVE
Glucose, UA: NEGATIVE mg/dL
Hgb urine dipstick: NEGATIVE
Ketones, ur: NEGATIVE mg/dL
Leukocytes,Ua: NEGATIVE
Nitrite: NEGATIVE
Protein, ur: NEGATIVE mg/dL
Specific Gravity, Urine: 1.023 (ref 1.005–1.030)
pH: 5 (ref 5.0–8.0)

## 2024-04-17 LAB — COMPREHENSIVE METABOLIC PANEL WITH GFR
ALT: 26 U/L (ref 0–44)
AST: 30 U/L (ref 15–41)
Albumin: 4.1 g/dL (ref 3.5–5.0)
Alkaline Phosphatase: 93 U/L (ref 38–126)
Anion gap: 12 (ref 5–15)
BUN: 12 mg/dL (ref 8–23)
CO2: 25 mmol/L (ref 22–32)
Calcium: 9.9 mg/dL (ref 8.9–10.3)
Chloride: 103 mmol/L (ref 98–111)
Creatinine, Ser: 0.76 mg/dL (ref 0.44–1.00)
GFR, Estimated: >60 mL/min
Glucose, Bld: 91 mg/dL (ref 70–99)
Potassium: 3.8 mmol/L (ref 3.5–5.1)
Sodium: 140 mmol/L (ref 135–145)
Total Bilirubin: 0.5 mg/dL (ref 0.0–1.2)
Total Protein: 7.2 g/dL (ref 6.5–8.1)

## 2024-04-17 LAB — SURGICAL PCR SCREEN
MRSA, PCR: POSITIVE — AB
Staphylococcus aureus: POSITIVE — AB

## 2024-04-17 NOTE — Pre-Procedure Instructions (Signed)
 Per cardiology note 04/14/24 patient to hold her beta blocker (carvedilol ) on the day of surgery. Confirmed info with patients son who was present at visit   From cardiology note 04/14/24 Essential hypertension Hypertension managed with carvedilol  and amlodipine . - Continue current antihypertensive regimen with carvedilol  and amlodipine . - Skip carvedilol  on the morning of surgery.

## 2024-04-17 NOTE — Progress Notes (Signed)
 Cardiac clearance note from 04-14-24:  [facility] Outside Information Initial consult 04/14/2024 [facility] [address] Alison Warren - 79 y.o. Female; born [id number] Encounter Summary , generated on Jan. 08, 2026January 08, 2026  Progress Notes - documented in this encounter  Custovic, Sabina, DO - 04/14/2024 11:45 AM EST Formatting of this note is different from the original.  Chief Complaint Patient presents with Establish Care pre op  Subjective  Alison Warren is a 79 y.o. female who presents for Establish Care and pre op HPI History of Present Illness Alison Warren is a 79 year old female who presents for pre-operative cardiac evaluation prior to knee replacement surgery. She is scheduled for knee replacement surgery on April 24, 2024, and requires cardiac clearance to ensure she can safely undergo the procedure. No recent changes in her cardiac status have been noted. She has not experienced any chest pain or shortness of breath. She had an echocardiogram in June 2025 and an EKG performed today.  She is currently taking carvedilol  and Zetia  for blood pressure management and Prilosec for acid reflux. She does not take aspirin  or any other medications. She mentions feeling 'shaky' but does not elaborate further on this symptom.  She lives alone, and her main doctor has recommended that she have inpatient rehabilitation after surgery because there is no one to watch her at home. She has a hearing impairment and requires a hearing aid.  Review of Systems  Patient Active Problem List Diagnosis Hypertension associated with type 2 diabetes mellitus (CMS/HHS-HCC) Hyperlipidemia associated with type 2 diabetes mellitus (CMS/HHS-HCC) GERD (gastroesophageal reflux disease) Type 2 diabetes mellitus with other specified complication (CMS/HHS-HCC) Cardiomyopathy (CMS/HHS-HCC) Hearing loss of both ears Muscle cramp,  nocturnal Abnormal mammogram with microcalcification Hypomagnesemia High risk medication use Congenital deformity of foot Seasonal allergic rhinitis due to pollen Osteopenia of multiple sites Cirrhosis of liver without ascites (CMS/HHS-HCC) Hx of adenomatous colonic polyps Vitamin D  deficiency Hepatic steatosis Obesity (BMI 30.0-34.9) B12 deficiency Localized osteoarthritis of left knee Lumbar spondylosis with myelopathy Osseous and subluxation stenosis of intervertebral foramina of lumbar region Other secondary scoliosis, thoracolumbar region Chronic bilateral low back pain with bilateral sciatica Lumbar stenosis with neurogenic claudication Aortic atherosclerosis Degenerative disc disease, cervical Acrochordon Benign essential hypertension Blindness of left eye with normal vision in contralateral eye Chest pain Hearing loss Other specified injury of long flexor muscle, fascia and tendon of right thumb at wrist and hand level, subsequent encounter Pruritic disorder Pure hypercholesterolemia Sebaceous cyst Cirrhosis of liver without ascites, unspecified hepatic cirrhosis type (CMS/HHS-HCC) Esophageal reflux NAFLD (nonalcoholic fatty liver disease) Hypertension Primary osteoarthritis of left knee Type II diabetes mellitus with complication (CMS/HHS-HCC)  Outpatient Medications Prior to Visit Medication Sig Dispense Refill acetaminophen  (TYLENOL ) 500 MG tablet Take 1,000 mg by mouth every 8 (eight) hours as needed for Pain aspirin  81 MG EC tablet Take 81 mg by mouth once daily. carvediloL  (COREG ) 3.125 MG tablet Take 1 tablet (3.125 mg total) by mouth 2 (two) times daily with meals for blood pressure and heart 180 tablet 3 cholecalciferol (VITAMIN D3) 5,000 unit capsule Take 5,000 Units by mouth once daily gabapentin  (NEURONTIN ) 100 MG capsule 1po qHS x 4 days, then bid x 4 days, then tid thereafter. (Patient taking differently: Take 100 mg by mouth 3 (three) times daily 1po  qHS x 4 days, then bid x 4 days, then tid thereafter.) 90 capsule 0 lancets (ONETOUCH DELICA LANCETS) once daily  Use as instructed. 100 each 3 metFORMIN  (GLUCOPHAGE -XR) 500 MG XR tablet Take 2 tablets (1,000 mg total) by mouth 2 (two) times daily with meals for diabetes 360 tablet 3 omeprazole  (PRILOSEC) 40 MG DR capsule TAKE 1 CAPSULE (40 MG TOTAL) BY MOUTH EVERY MORNING BEFORE BREAKFAST FOR REFLUX 90 capsule 0 ONETOUCH ULTRA TEST test strip TEST ONCE DAILY AS DIRECTED 100 strip 0 polyethylene glycol (MIRALAX ) powder Take 17 g by mouth once daily as needed amLODIPine  (NORVASC ) 10 MG tablet TAKE 1 TABLET (10 MG TOTAL) BY MOUTH ONCE DAILY FOR BLOOD PRESSURE 90 tablet 1 cyanocobalamin (VITAMIN B12) 500 MCG tablet Take 500 mcg by mouth once daily (Patient not taking: Reported on 03/29/2020) ezetimibe  (ZETIA ) 10 mg tablet Take 1 tablet (10 mg total) by mouth once daily For cholesterol 90 tablet 3  No facility-administered medications prior to visit.    Objective  Vitals: 04/14/24 1106 BP: (!) 140/80 Pulse: 85 Resp: 16 SpO2: 97% Weight: 69.9 kg (154 lb) Height: 163.8 cm (5' 4.5) PainSc: 0-No pain  Body mass index is 26.03 kg/m.  Home Vitals:   Physical Exam Physical Exam  Constitutional: alert, in NAD, and communicates well Eye exam: pupils equal and reactive, extraocular eye movements intact. Neck: supple, no thyroid  enlargement or cervical adenopathy, and no bruits heard Respiratory: clear to auscultation, without rales or wheezes Cardiovascular: regular rate and rhythm and without murmurs, rubs or gallops Lower extremities: no lower extremity edema Skin ankles/feet: warm, good capillary refill and no ulcerations or lesions noted Neurological: sensorimotor grossly intact and normal muscle tone  Results Diagnostic Echocardiogram (09/2023): Normal left ventricular systolic function, normal valvular function EKG (04/14/2024): Normal (Independently  interpreted)    Assessment/Plan:  Assessment & Plan Preoperative cardiovascular evaluation for knee replacement Cardiovascular evaluation for upcoming knee replacement surgery. Recent echocardiogram in June showed a strong heart with normal valves. Current EKG is normal. No changes in cardiac status since last evaluation. Cleared for surgery based on current findings. - Proceed with knee replacement surgery on January 15th, 2026. - Ensure inpatient rehabilitation is arranged post-surgery due to living situation. - Scheduled follow-up appointment in three months to reassess cardiac status post-surgery.  Essential hypertension Hypertension managed with carvedilol  and amlodipine . - Continue current antihypertensive regimen with carvedilol  and amlodipine . - Skip carvedilol  on the morning of surgery. Diagnoses and all orders for this visit:  HTN, goal below 130/80  Preop cardiovascular exam  Mixed hyperlipidemia  Return in about 3 months (around 07/13/2024).  Future Appointments  Date/Time Provider Department Center Visit Type 04/14/2024 11:45 AM (Arrive by 11:30 AM) Custovic, Sabina, DO [facility] NEW PATIENT 04/15/2024 2:30 PM Charlene Debby Bruckner, PA [facility] RETURN VISIT 05/13/2024 2:45 PM Charlene Debby Bruckner, PA [facility] POST-OP 05/13/2024 3:30 PM Douglass Longs, PT [facility] C EVALUATION 60 06/10/2024 1:45 PM Aberman, Arthea Arch, MD [facility] C POST-OP    There are no Patient Instructions on file for this visit.  An after visit summary was provided for the patient either in written format (printed) or through [facility].  This note has been created using automated tools and reviewed for accuracy by [facility] CUSTOVIC.  Electronically signed by Dewane Shiner, DO at 04/14/2024 11:25 AM EST

## 2024-04-17 NOTE — Patient Instructions (Signed)
 Your procedure is scheduled on: Thursday 04/24/24 To find out your arrival time, please call [phone] between 1PM - 3PM on: Wednesday 04/23/24 Report to the Registration Desk on the 1st floor of the [facility]. If your arrival time is 6:00 am, do not arrive before that time as the [facility] entrance doors do not open until 6:00 am.  REMEMBER: Instructions that are not followed completely may result in serious medical risk, up to and including death; or upon the discretion of your surgeon and anesthesiologist your surgery may need to be rescheduled.  Do not eat food after midnight the night before surgery.  No gum chewing or hard candies.  You may however, drink CLEAR liquids up to 2 hours before you are scheduled to arrive for your surgery. Do not drink anything within 2 hours of your scheduled arrival time.  Clear liquids include: - water  - gatorade (not RED colors)  **Type 1 and Type 2 diabetics should only drink water.**  In addition, your doctor has ordered for you to drink the provided:  Gatorade G2 Drinking this carbohydrate drink up to two hours before surgery helps to reduce insulin  resistance and improve patient outcomes. Please complete drinking 2 hours before scheduled arrival time.  One week prior to surgery: Stop Anti-inflammatories (NSAIDS) such as Advil, Aleve, Ibuprofen, Motrin, Naproxen, Naprosyn and Aspirin  based products such as Excedrin, Goody's Powder, BC Powder.  You may however, continue to take Tylenol  if needed for pain up until the day of surgery.  Stop ANY OVER THE COUNTER supplements and vitamins for at least 7 days until after surgery.  **Follow guidelines for insulin  and diabetes medications.** HOLD METFORMIN  FOR 2 DAYS, LAST DOSE WILL BE MONDAY NIGHT 04/21/24  Continue taking all of your other prescription medications up until the day of surgery.  ON THE DAY OF SURGERY ONLY TAKE THESE MEDICATIONS WITH SIPS OF WATER:  amLODipine  (NORVASC ) 5  MG tablet  ezetimibe  (ZETIA ) 10 MG tablet  pantoprazole  (PROTONIX ) 40 MG tablet   No Alcohol for 24 hours before or after surgery.  No Smoking including e-cigarettes for 24 hours before surgery.  No chewable tobacco products for at least 6 hours before surgery.  No nicotine patches on the day of surgery.  Do not use any recreational drugs for at least a week (preferably 2 weeks) before your surgery.  Please be advised that the combination of cocaine and anesthesia may have negative outcomes, up to and including death. If you test positive for cocaine, your surgery will be cancelled.  On the morning of surgery brush your teeth with toothpaste and water, you may rinse your mouth with mouthwash if you wish. Do not swallow any toothpaste or mouthwash.  Use CHG Soap or wipes as directed on instruction sheet. (You can pick this up at our office in the [facility], the building to the left of the [employer], [address] at [address]  Do not shave body hair from the neck down 48 hours before surgery.  Do not wear lotions, powders, or perfumes.   Wear comfortable clothing (specific to your surgery type) to the hospital.  Do not wear jewelry, make-up, hairpins, clips or nail polish.  For welded (permanent) jewelry: bracelets, anklets, waist bands, etc.  Please have this removed prior to surgery.  If it is not removed, there is a chance that hospital personnel will need to cut it off on the day of surgery.  Contact lenses, hearing  aids and dentures may not be worn into surgery.  Do not bring valuables to the hospital. [facility] is not responsible for any missing/lost belongings or valuables.   Notify your doctor if there is any change in your medical condition (cold, fever, infection).  After surgery, you can help prevent lung complications by doing breathing exercises.  Take deep breaths and cough every 1-2 hours. Your doctor may order a device called an  Incentive Spirometer to help you take deep breaths.  If you are being admitted to the hospital overnight, leave your suitcase in the car. After surgery it may be brought to your room.  In case of increased patient census, it may be necessary for you, the patient, to continue your postoperative care in the Same Day Surgery department.  Please call the Pre-admissions Testing Dept. at [phone] if you have any questions about these instructions.  Surgery Visitation Policy:  Patients having surgery or a procedure may have two visitors.  Children under the age of 56 must have an adult with them who is not the patient.  Inpatient Visitation:    Visiting hours are 7 a.m. to 8 p.m. Up to four visitors are allowed at one time in a patient room. The visitors may rotate out with other people during the day.  One visitor age 83 or older may stay with the patient overnight and must be in the room by 8 p.m.   Merchandiser, Retail to address health-related social needs:  [email or web address]    Pre-operative 4 CHG Bath Instructions   You can play a key role in reducing the risk of infection after surgery. Your skin needs to be as free of germs as possible. You can reduce the number of germs on your skin by washing with CHG (chlorhexidine  gluconate) soap before surgery. CHG is an antiseptic soap that kills germs and continues to kill germs even after washing.   DO NOT use if you have an allergy to chlorhexidine /CHG or antibacterial soaps. If your skin becomes reddened or irritated, stop using the CHG and notify one of our RNs at [phone].   Please shower with the CHG soap starting 4 days before surgery using the following schedule:   SUNDAY 04/20/24 - WEDNESDAY 04/23/24    Please keep in mind the following:  DO NOT shave, including legs and underarms, starting the day of your first shower.   You may shave your face at any point before/day of surgery.  Place clean  sheets on your bed the day you start using CHG soap. Use a clean washcloth (not used since being washed) for each shower. DO NOT sleep with pets once you start using the CHG.   CHG Shower Instructions:  If you choose to wash your hair and private area, wash first with your normal shampoo/soap.  After you use shampoo/soap, rinse your hair and body thoroughly to remove shampoo/soap residue.  Turn the water OFF and apply about 3 tablespoons (45 ml) of CHG soap to a CLEAN washcloth.  Apply CHG soap ONLY FROM YOUR NECK DOWN TO YOUR TOES (washing for 3-5 minutes)  DO NOT use CHG soap on face, private areas, open wounds, or sores.  Pay special attention to the area where your surgery is being performed.  If you are having back surgery, having someone wash your back for you may be helpful. Wait 2 minutes after CHG soap is applied, then you may rinse off the CHG soap.  Pat dry with a clean towel  Put on clean clothes/pajamas   If you choose to wear lotion, please use ONLY the CHG-compatible lotions on the back of this paper.     Additional instructions for the day of surgery: DO NOT APPLY any lotions, deodorants, cologne, or perfumes.   Put on clean/comfortable clothes.  Brush your teeth.  Ask your nurse before applying any prescription medications to the skin.      CHG Compatible Lotions   Aveeno Moisturizing lotion  Cetaphil Moisturizing Cream  Cetaphil Moisturizing Lotion  Clairol Herbal Essence Moisturizing Lotion, Dry Skin  Clairol Herbal Essence Moisturizing Lotion, Extra Dry Skin  Clairol Herbal Essence Moisturizing Lotion, Normal Skin  Curel Age Defying Therapeutic Moisturizing Lotion with Alpha Hydroxy  Curel Extreme Care Body Lotion  Curel Soothing Hands Moisturizing Hand Lotion  Curel Therapeutic Moisturizing Cream, Fragrance-Free  Curel Therapeutic Moisturizing Lotion, Fragrance-Free  Curel Therapeutic Moisturizing Lotion, Original Formula  Eucerin Daily Replenishing  Lotion  Eucerin Dry Skin Therapy Plus Alpha Hydroxy Crme  Eucerin Dry Skin Therapy Plus Alpha Hydroxy Lotion  Eucerin Original Crme  Eucerin Original Lotion  Eucerin Plus Crme Eucerin Plus Lotion  Eucerin TriLipid Replenishing Lotion  Keri Anti-Bacterial Hand Lotion  Keri Deep Conditioning Original Lotion Dry Skin Formula Softly Scented  Keri Deep Conditioning Original Lotion, Fragrance Free Sensitive Skin Formula  Keri Lotion Fast Absorbing Fragrance Free Sensitive Skin Formula  Keri Lotion Fast Absorbing Softly Scented Dry Skin Formula  Keri Original Lotion  Keri Skin Renewal Lotion Keri Silky Smooth Lotion  Keri Silky Smooth Sensitive Skin Lotion  Nivea Body Creamy Conditioning Oil  Nivea Body Extra Enriched Lotion  Nivea Body Original Lotion  Nivea Body Sheer Moisturizing Lotion Nivea Crme  Nivea Skin Firming Lotion  NutraDerm 30 Skin Lotion  NutraDerm Skin Lotion  NutraDerm Therapeutic Skin Cream  NutraDerm Therapeutic Skin Lotion  ProShield Protective Hand Cream  Provon moisturizing lotion  How to Use an Incentive Spirometer  An incentive spirometer is a tool that measures how well you are filling your lungs with each breath. Learning to take long, deep breaths using this tool can help you keep your lungs clear and active. This may help to reverse or lessen your chance of developing breathing (pulmonary) problems, especially infection. You may be asked to use a spirometer: After a surgery. If you have a lung problem or a history of smoking. After a long period of time when you have been unable to move or be active. If the spirometer includes an indicator to show the highest number that you have reached, your health care provider or respiratory therapist will help you set a goal. Keep a log of your progress as told by your health care provider. What are the risks? Breathing too quickly may cause dizziness or cause you to pass out. Take your time so you do not get dizzy or  light-headed. If you are in pain, you may need to take pain medicine before doing incentive spirometry. It is harder to take a deep breath if you are having pain. How to use your incentive spirometer  Sit up on the edge of your bed or on a chair. Hold the incentive spirometer so that it is in an upright position. Before you use the spirometer, breathe out normally. Place the mouthpiece in your mouth. Make sure your lips are closed tightly around it. Breathe in slowly and as deeply as you can through your mouth, causing the piston or the ball to rise toward the top of the chamber. Hold  your breath for 3-5 seconds, or for as long as possible. If the spirometer includes a coach indicator, use this to guide you in breathing. Slow down your breathing if the indicator goes above the marked areas. Remove the mouthpiece from your mouth and breathe out normally. The piston or ball will return to the bottom of the chamber. Rest for a few seconds, then repeat the steps 10 or more times. Take your time and take a few normal breaths between deep breaths so that you do not get dizzy or light-headed. Do this every 1-2 hours when you are awake. If the spirometer includes a goal marker to show the highest number you have reached (best effort), use this as a goal to work toward during each repetition. After each set of 10 deep breaths, cough a few times. This will help to make sure that your lungs are clear. If you have an incision on your chest or abdomen from surgery, place a pillow or a rolled-up towel firmly against the incision when you cough. This can help to reduce pain while taking deep breaths and coughing. General tips When you are able to get out of bed: Walk around often. Continue to take deep breaths and cough in order to clear your lungs. Keep using the incentive spirometer until your health care provider says it is okay to stop using it. If you have been in the hospital, you may be told to keep  using the spirometer at home. Contact a health care provider if: You are having difficulty using the spirometer. You have trouble using the spirometer as often as instructed. Your pain medicine is not giving enough relief for you to use the spirometer as told. You have a fever. Get help right away if: You develop shortness of breath. You develop a cough with bloody mucus from the lungs. You have fluid or blood coming from an incision site after you cough. Summary An incentive spirometer is a tool that can help you learn to take long, deep breaths to keep your lungs clear and active. You may be asked to use a spirometer after a surgery, if you have a lung problem or a history of smoking, or if you have been inactive for a long period of time. Use your incentive spirometer as instructed every 1-2 hours while you are awake. If you have an incision on your chest or abdomen, place a pillow or a rolled-up towel firmly against your incision when you cough. This will help to reduce pain. Get help right away if you have shortness of breath, you cough up bloody mucus, or blood comes from your incision when you cough. This information is not intended to replace advice given to you by your health care provider. Make sure you discuss any questions you have with your health care provider. Document Revised: 06/16/2019 Document Reviewed: 06/16/2019 Elsevier Patient Education  2023 [employer].

## 2024-04-21 ENCOUNTER — Other Ambulatory Visit: Payer: Self-pay | Admitting: Orthopedic Surgery

## 2024-04-21 NOTE — Progress Notes (Signed)
 Sent medications to the pharmacy also called and left a voicemail letting the patient know what the medications were or why they were needed and which pharmacy they were sent to

## 2024-04-24 ENCOUNTER — Encounter: Payer: Self-pay | Admitting: Orthopedic Surgery

## 2024-04-24 ENCOUNTER — Ambulatory Visit

## 2024-04-24 ENCOUNTER — Observation Stay
Admission: RE | Admit: 2024-04-24 | Discharge: 2024-04-28 | Disposition: A | Discharge status: Skilled Nursing Facility | Source: Ambulatory Visit | Attending: Orthopedic Surgery | Admitting: Orthopedic Surgery

## 2024-04-24 ENCOUNTER — Other Ambulatory Visit: Payer: Self-pay

## 2024-04-24 ENCOUNTER — Encounter
Admission: RE | Disposition: A | Discharge status: Skilled Nursing Facility | Payer: Self-pay | Source: Ambulatory Visit | Attending: Orthopedic Surgery

## 2024-04-24 DIAGNOSIS — E119 Type 2 diabetes mellitus without complications: Secondary | ICD-10-CM | POA: Insufficient documentation

## 2024-04-24 DIAGNOSIS — Z79899 Other long term (current) drug therapy: Secondary | ICD-10-CM | POA: Diagnosis not present

## 2024-04-24 DIAGNOSIS — Z7984 Long term (current) use of oral hypoglycemic drugs: Secondary | ICD-10-CM | POA: Insufficient documentation

## 2024-04-24 DIAGNOSIS — M25562 Pain in left knee: Secondary | ICD-10-CM | POA: Diagnosis present

## 2024-04-24 DIAGNOSIS — I10 Essential (primary) hypertension: Secondary | ICD-10-CM | POA: Diagnosis not present

## 2024-04-24 DIAGNOSIS — M1712 Unilateral primary osteoarthritis, left knee: Principal | ICD-10-CM | POA: Insufficient documentation

## 2024-04-24 DIAGNOSIS — Z01818 Encounter for other preprocedural examination: Secondary | ICD-10-CM

## 2024-04-24 DIAGNOSIS — Z96652 Presence of left artificial knee joint: Secondary | ICD-10-CM

## 2024-04-24 HISTORY — PX: TOTAL KNEE ARTHROPLASTY: SHX125

## 2024-04-24 LAB — GLUCOSE, CAPILLARY
Glucose-Capillary: 131 mg/dL — ABNORMAL HIGH (ref 70–99)
Glucose-Capillary: 169 mg/dL — ABNORMAL HIGH (ref 70–99)
Glucose-Capillary: 316 mg/dL — ABNORMAL HIGH (ref 70–99)

## 2024-04-24 MED ORDER — ONDANSETRON HCL 4 MG PO TABS: 4.0000 mg | ORAL_TABLET | Freq: Four times a day (QID) | ORAL | Status: DC | PRN

## 2024-04-24 MED ORDER — ACETAMINOPHEN 500 MG PO TABS
1000.0000 mg | ORAL_TABLET | Freq: Three times a day (TID) | ORAL | Status: AC
  Administered 2024-04-24 – 2024-04-25 (×4): 1000 mg via ORAL
  Filled 2024-04-24 (×3): qty 2

## 2024-04-24 MED ORDER — ACETAMINOPHEN 10 MG/ML IV SOLN
INTRAVENOUS | Status: DC | PRN
  Administered 2024-04-24: 1000 mg via INTRAVENOUS

## 2024-04-24 MED ORDER — FENTANYL CITRATE (PF) 100 MCG/2ML IJ SOLN
INTRAMUSCULAR | Status: AC
  Filled 2024-04-24: qty 2

## 2024-04-24 MED ORDER — METOCLOPRAMIDE HCL 10 MG PO TABS: 5.0000 mg | ORAL_TABLET | Freq: Three times a day (TID) | ORAL | Status: DC | PRN

## 2024-04-24 MED ORDER — CEFAZOLIN SODIUM-DEXTROSE 2-4 GM/100ML-% IV SOLN
INTRAVENOUS | Status: AC
  Filled 2024-04-24: qty 100

## 2024-04-24 MED ORDER — SODIUM CHLORIDE 0.9 % IV SOLN: INTRAVENOUS | Status: DC

## 2024-04-24 MED ORDER — SODIUM CHLORIDE (PF) 0.9 % IJ SOLN
INTRAMUSCULAR | Status: DC | PRN
  Administered 2024-04-24: 71 mL via INTRAMUSCULAR

## 2024-04-24 MED ORDER — TRANEXAMIC ACID-NACL 1000-0.7 MG/100ML-% IV SOLN
INTRAVENOUS | Status: AC
  Filled 2024-04-24: qty 100

## 2024-04-24 MED ORDER — KETOROLAC TROMETHAMINE 30 MG/ML IJ SOLN
INTRAMUSCULAR | Status: AC
  Filled 2024-04-24: qty 1

## 2024-04-24 MED ORDER — DOCUSATE SODIUM 100 MG PO CAPS
100.0000 mg | ORAL_CAPSULE | Freq: Two times a day (BID) | ORAL | Status: DC
  Administered 2024-04-24 – 2024-04-28 (×8): 100 mg via ORAL
  Filled 2024-04-24 (×8): qty 1

## 2024-04-24 MED ORDER — FENTANYL CITRATE (PF) 100 MCG/2ML IJ SOLN
INTRAMUSCULAR | Status: DC | PRN
  Administered 2024-04-24 (×2): 25 ug via INTRAVENOUS

## 2024-04-24 MED ORDER — METOCLOPRAMIDE HCL 5 MG/ML IJ SOLN: 5.0000 mg | Freq: Three times a day (TID) | INTRAMUSCULAR | Status: DC | PRN

## 2024-04-24 MED ORDER — ACETAMINOPHEN 325 MG PO TABS
325.0000 mg | ORAL_TABLET | Freq: Four times a day (QID) | ORAL | Status: DC | PRN
  Administered 2024-04-25 – 2024-04-27 (×2): 650 mg via ORAL
  Filled 2024-04-24 (×2): qty 2

## 2024-04-24 MED ORDER — PROPOFOL 10 MG/ML IV BOLUS
INTRAVENOUS | Status: DC | PRN
  Administered 2024-04-24: 10 mg via INTRAVENOUS

## 2024-04-24 MED ORDER — PHENOL 1.4 % MT LIQD: 1.0000 | OROMUCOSAL | Status: DC | PRN

## 2024-04-24 MED ORDER — PROPOFOL 500 MG/50ML IV EMUL
INTRAVENOUS | Status: DC | PRN
  Administered 2024-04-24: 90 ug/kg/min via INTRAVENOUS

## 2024-04-24 MED ORDER — MORPHINE SULFATE (PF) 2 MG/ML IV SOLN
0.5000 mg | INTRAVENOUS | Status: DC | PRN
  Administered 2024-04-26 – 2024-04-27 (×3): 0.5 mg via INTRAVENOUS
  Filled 2024-04-24 (×3): qty 1

## 2024-04-24 MED ORDER — INSULIN ASPART 100 UNIT/ML IJ SOLN
0.0000 [IU] | Freq: Every day | INTRAMUSCULAR | Status: DC
  Administered 2024-04-24: 4 [IU] via SUBCUTANEOUS
  Administered 2024-04-25: 2 [IU] via SUBCUTANEOUS
  Filled 2024-04-24: qty 2
  Filled 2024-04-24: qty 4

## 2024-04-24 MED ORDER — CHLORHEXIDINE GLUCONATE 0.12 % MT SOLN
15.0000 mL | Freq: Once | OROMUCOSAL | Status: AC
  Administered 2024-04-24: 15 mL via OROMUCOSAL

## 2024-04-24 MED ORDER — OXYCODONE HCL 5 MG PO TABS
5.0000 mg | ORAL_TABLET | Freq: Once | ORAL | Status: AC | PRN
  Administered 2024-04-24: 5 mg via ORAL

## 2024-04-24 MED ORDER — DEXAMETHASONE SOD PHOSPHATE PF 10 MG/ML IJ SOLN
8.0000 mg | Freq: Once | INTRAMUSCULAR | Status: AC
  Administered 2024-04-24: 8 mg via INTRAVENOUS

## 2024-04-24 MED ORDER — CARVEDILOL 3.125 MG PO TABS
3.1250 mg | ORAL_TABLET | Freq: Two times a day (BID) | ORAL | Status: DC
  Administered 2024-04-25 – 2024-04-28 (×7): 3.125 mg via ORAL
  Filled 2024-04-24 (×7): qty 1

## 2024-04-24 MED ORDER — FENTANYL CITRATE (PF) 100 MCG/2ML IJ SOLN: 25.0000 ug | INTRAMUSCULAR | Status: DC | PRN

## 2024-04-24 MED ORDER — KETOROLAC TROMETHAMINE 15 MG/ML IJ SOLN
7.5000 mg | Freq: Four times a day (QID) | INTRAMUSCULAR | Status: AC
  Administered 2024-04-24 – 2024-04-25 (×4): 7.5 mg via INTRAVENOUS
  Filled 2024-04-24 (×5): qty 1

## 2024-04-24 MED ORDER — CHLORHEXIDINE GLUCONATE 4 % EX SOLN: 1.0000 | CUTANEOUS | 1 refills | Status: DC

## 2024-04-24 MED ORDER — CEFAZOLIN SODIUM-DEXTROSE 2-4 GM/100ML-% IV SOLN
2.0000 g | INTRAVENOUS | Status: AC
  Administered 2024-04-24: 2 g via INTRAVENOUS

## 2024-04-24 MED ORDER — ORAL CARE MOUTH RINSE: 15.0000 mL | Freq: Once | OROMUCOSAL | Status: AC

## 2024-04-24 MED ORDER — BUPIVACAINE HCL (PF) 0.5 % IJ SOLN
INTRAMUSCULAR | Status: DC | PRN
  Administered 2024-04-24: 2.6 mL

## 2024-04-24 MED ORDER — SURGIPHOR WOUND IRRIGATION SYSTEM - OPTIME: TOPICAL | Status: DC | PRN

## 2024-04-24 MED ORDER — EZETIMIBE 10 MG PO TABS
10.0000 mg | ORAL_TABLET | Freq: Every day | ORAL | Status: DC
  Administered 2024-04-25 – 2024-04-28 (×4): 10 mg via ORAL
  Filled 2024-04-24 (×3): qty 1

## 2024-04-24 MED ORDER — CHLORHEXIDINE GLUCONATE 0.12 % MT SOLN
OROMUCOSAL | Status: AC
  Filled 2024-04-24: qty 15

## 2024-04-24 MED ORDER — PHENYLEPHRINE 80 MCG/ML (10ML) SYRINGE FOR IV PUSH (FOR BLOOD PRESSURE SUPPORT)
PREFILLED_SYRINGE | INTRAVENOUS | Status: DC | PRN
  Administered 2024-04-24: 80 ug via INTRAVENOUS

## 2024-04-24 MED ORDER — CEFAZOLIN SODIUM-DEXTROSE 2-4 GM/100ML-% IV SOLN
2.0000 g | Freq: Four times a day (QID) | INTRAVENOUS | Status: AC
  Administered 2024-04-24 (×2): 2 g via INTRAVENOUS
  Filled 2024-04-24: qty 100

## 2024-04-24 MED ORDER — AMLODIPINE BESYLATE 5 MG PO TABS
5.0000 mg | ORAL_TABLET | Freq: Every day | ORAL | Status: DC
  Administered 2024-04-25 – 2024-04-28 (×4): 5 mg via ORAL
  Filled 2024-04-24 (×4): qty 1

## 2024-04-24 MED ORDER — ENOXAPARIN SODIUM 30 MG/0.3ML IJ SOSY
30.0000 mg | PREFILLED_SYRINGE | Freq: Two times a day (BID) | INTRAMUSCULAR | Status: DC
  Administered 2024-04-25 – 2024-04-28 (×7): 30 mg via SUBCUTANEOUS
  Filled 2024-04-24 (×7): qty 0.3

## 2024-04-24 MED ORDER — HYDROCODONE-ACETAMINOPHEN 7.5-325 MG PO TABS
1.0000 | ORAL_TABLET | ORAL | Status: DC | PRN
  Administered 2024-04-26 – 2024-04-28 (×4): 1 via ORAL
  Filled 2024-04-24 (×4): qty 1

## 2024-04-24 MED ORDER — INSULIN ASPART 100 UNIT/ML IJ SOLN
0.0000 [IU] | Freq: Three times a day (TID) | INTRAMUSCULAR | Status: DC
  Administered 2024-04-25: 3 [IU] via SUBCUTANEOUS
  Administered 2024-04-25: 2 [IU] via SUBCUTANEOUS
  Administered 2024-04-25 – 2024-04-26 (×2): 3 [IU] via SUBCUTANEOUS
  Administered 2024-04-26: 2 [IU] via SUBCUTANEOUS
  Administered 2024-04-26: 3 [IU] via SUBCUTANEOUS
  Administered 2024-04-27: 2 [IU] via SUBCUTANEOUS
  Administered 2024-04-27: 3 [IU] via SUBCUTANEOUS
  Administered 2024-04-27: 2 [IU] via SUBCUTANEOUS
  Filled 2024-04-24: qty 2
  Filled 2024-04-24 (×2): qty 3
  Filled 2024-04-24: qty 2
  Filled 2024-04-24: qty 1
  Filled 2024-04-24 (×3): qty 2
  Filled 2024-04-24: qty 3

## 2024-04-24 MED ORDER — HYDROCODONE-ACETAMINOPHEN 5-325 MG PO TABS
1.0000 | ORAL_TABLET | ORAL | Status: DC | PRN
  Administered 2024-04-26 – 2024-04-28 (×4): 1 via ORAL
  Filled 2024-04-24 (×5): qty 1

## 2024-04-24 MED ORDER — ONDANSETRON HCL 4 MG/2ML IJ SOLN
4.0000 mg | Freq: Four times a day (QID) | INTRAMUSCULAR | Status: DC | PRN
  Administered 2024-04-26 (×2): 4 mg via INTRAVENOUS
  Filled 2024-04-24 (×2): qty 2

## 2024-04-24 MED ORDER — TRANEXAMIC ACID-NACL 1000-0.7 MG/100ML-% IV SOLN
1000.0000 mg | INTRAVENOUS | Status: AC
  Administered 2024-04-24 (×2): 1000 mg via INTRAVENOUS

## 2024-04-24 MED ORDER — LIDOCAINE HCL (PF) 2 % IJ SOLN
INTRAMUSCULAR | Status: AC
  Filled 2024-04-24: qty 5

## 2024-04-24 MED ORDER — PANTOPRAZOLE SODIUM 40 MG PO TBEC
40.0000 mg | DELAYED_RELEASE_TABLET | Freq: Every day | ORAL | Status: DC
  Administered 2024-04-24 – 2024-04-28 (×5): 40 mg via ORAL
  Filled 2024-04-24 (×5): qty 1

## 2024-04-24 MED ORDER — PHENYLEPHRINE HCL-NACL 20-0.9 MG/250ML-% IV SOLN
INTRAVENOUS | Status: DC | PRN
  Administered 2024-04-24: 30 ug/min via INTRAVENOUS

## 2024-04-24 MED ORDER — OXYCODONE HCL 5 MG/5ML PO SOLN: 5.0000 mg | Freq: Once | ORAL | Status: AC | PRN

## 2024-04-24 MED ORDER — SODIUM CHLORIDE 0.9 % IR SOLN
Status: DC | PRN
  Administered 2024-04-24: 3000 mL

## 2024-04-24 MED ORDER — MUPIROCIN 2 % EX OINT: 1.0000 | TOPICAL_OINTMENT | Freq: Two times a day (BID) | CUTANEOUS | 0 refills | Status: AC

## 2024-04-24 MED ORDER — ONDANSETRON HCL 4 MG/2ML IJ SOLN
INTRAMUSCULAR | Status: DC | PRN
  Administered 2024-04-24: 4 mg via INTRAVENOUS

## 2024-04-24 MED ORDER — OXYCODONE HCL 5 MG PO TABS
ORAL_TABLET | ORAL | Status: AC
  Filled 2024-04-24: qty 1

## 2024-04-24 MED ORDER — MENTHOL 3 MG MT LOZG: 1.0000 | LOZENGE | OROMUCOSAL | Status: DC | PRN

## 2024-04-24 NOTE — Op Note (Signed)
 Patient Name: Alison Warren  FMW:[id number]  Pre-Operative Diagnosis: Left knee Osteoarthritis  Post-Operative Diagnosis: (same)  Procedure: Left Total Knee Arthroplasty  Components/Implants: Femur: Persona Size 9 CR Narrow   Tibia: Persona Size D Keel Cemented  Poly: 10mm MC  Patella: 32x8.56mm symmetric  Femoral Valgus Cut Angle: 5 degrees  Distal Femoral Re-cut: yes +24mm  Patella Resurfacing: yes   Date of Surgery: 04/24/2024  Surgeon: Arthea Sheer MD  Assistant: [facility] RNFA (present and scrubbed throughout the case, critical for assistance with exposure, retraction, instrumentation, and closure)   Anesthesiologist: Chesley  Anesthesia: Spinal   Tourniquet Time: 72 min  EBL: 50cc  IVF: 800cc  Complications: None   Brief history: The patient is a 79 year old female with a history of osteoarthritis of the left knee with pain limiting their range of motion and activities of daily living, which has failed multiple attempts at conservative therapy.  The risks and benefits of total knee arthroplasty as definitive surgical treatment were discussed with the patient, who opted to proceed with the operation.  After outpatient medical clearance and optimization was completed the patient was admitted to [facility] for the procedure.  All preoperative films were reviewed and an appropriate surgical plan was made prior to surgery. Preoperative range of motion was 10 to 80 with a 10 flexion contracture.   Description of procedure: The patient was brought to the operating room where laterality was confirmed by all those present to be the left side.   Spinal anesthesia was administered and the patient received an intravenous dose of antibiotics for surgical prophylaxis and a dose of tranexamic acid .  Patient is positioned supine on the operating room table with all bony prominences well-padded.  A well-padded tourniquet was applied to the left thigh.  The knee was  then prepped and draped in usual sterile fashion with multiple layers of adhesive and nonadhesive drapes.  All of those present in the operating room participated in a surgical timeout laterality and patient were confirmed.   An Esmarch was wrapped around the extremity and the leg was elevated and the knee flexed.  The tourniquet was inflated to a pressure of 250 mmHg. The Esmarch was removed and the leg was brought down to full extension.  The patella and tibial tubercle identified and outlined using a marking pen and a midline skin incision was made with a knife carried through the subcutaneous tissue down to the extensor retinaculum.  After exposure of the extensor mechanism the medial parapatellar arthrotomy was performed with a scalpel and electrocautery extending down medial and distal to the tibial tubercle taking care to avoid incising the patellar tendon.   A standard medial release was performed over the proximal tibia.  The knee was brought into extension in order to excise the fat pad taking care not to damage the patella tendon.  The superior soft tissue was removed from the anterior surface of the distal femur to visualize for the procedure.  The knee was then brought into flexion with the patella subluxed laterally and subluxing the tibia anteriorly.  The ACL was transected and removed with electrocautery and additional soft tissue was removed from the proximal surface of the tibia to fully expose. The PCL was found to be partially intact and was preserved.  An extramedullary tibial cutting guide was then applied to the leg with a spring-loaded ankle clamp placed around the distal tibia just above the malleoli the angulation of the guide was adjusted to give some posterior  slope in the tibial resection with an appropriate varus/valgus alignment.  The resection guide was then pinned to the proximal tibia and the proximal tibial surface was resected with an oscillating saw.  Careful attention was  paid to ensure the blade did not disrupt any of the soft tissues including any lateral or medial ligament.  Attention was then turned to the femur, with the knee slightly flexed a opening drill was used to enter the medullary canal of the femur.  After removing the drill marrow was suctioned out to decompress the distal femur.  An intramedullary femoral guide was then inserted into the drill hole and the alignment guide was seated firmly against the distal end of the medial femoral condyle.  The distal femoral cutting guide was then attached and pinned securely to the anterior surface of the femur and the intramedullary rod and alignment guide was removed.  Distal femur resection was then performed with an oscillating saw with retractors protecting medial and laterally.   The distal cutting block was then removed and the extension gap was checked with a spacer.  Extension gap was found to be appropriately sized to accommodate the spacer block. The spacer block was found to be tight and did not allow full extension so the femoral cutting guide was reattached and additional 2 mm of distal femur were resected.   The femoral sizing guide was then placed securely into the posterior condyles of the femur and the femoral size was measured and determined to be 9.  The size 9; 4-in-1 cutting guide was placed in position and secured with 2 pins.  The anterior posterior and chamfer resections were then performed with an oscillating saw.  Bony fragments and osteophytes were then removed.  Using a lamina spreader the posterior medial and lateral condyles were checked for additional osteophytes and posterior soft tissue remnants.  Any remaining meniscus was removed at this time.  Periarticular injection was performed in the meniscal rims and posterior capsule with aspiration performed to ensure no intravascular injection.   The tibia was then exposed and the tibial trial was pinned onto the plateau after confirming  appropriate orientation and rotation.  Using the drill bushing the tibia was prepared to the appropriate drill depth.  Tibial broach impactor was then driven through the punch guide using a mallet.  The femoral trial component was then inserted onto the femur. A trial tibial polyethylene bearing was then placed and the knee was reduced.  The knee achieved full extension with no hyperextension and was found to be balanced in flexion and extension with the trials in place.  The knee was then brought into full extension the patella was everted and held with 2 Kocher clamps.  The articular surface of the patella was then resected with an patella reamer and saw after careful measurement with a caliper.  The patella was then prepared with the drill guide and a trial patella was placed.  The knee was then taken through range of motion and it was found that the patella articulated appropriately with the trochlea and good patellofemoral motion without subluxation.    The correct final components for implantation were confirmed and opened by the circulator nurse.  A bone plug was fashioned from the patient's bone cuts to plug the intramedullary femoral canal access hole and was tamped into place.  The prepared surfaces of the patella femur and tibia were cleaned with pulsatile lavage to remove all blood fat and other material and then the surfaces were  dried.  2 bags of cement were mixed under vacuum and the components were cemented into place.  Excess cement was removed with curettes and forceps. A trial polyethylene tibial component was placed and the knee was brought into extension to allow the cement to set.  At this time the periarticular injection cocktail was placed in the soft tissues surrounding the knee.  After full curing of the cement the balance of the knee was checked again and the final polyethylene size was confirmed. The tibial component was irrigated and locking mechanism checked to ensure it was clear of  debris. The real polyethylene tibial component was implanted and the knee was brought through a range of motion.   The knee was then irrigated with copious amount of normal saline via pulsatile lavage to remove all loose bodies and other debris.  The knee was then irrigated with surgiphor betadine  based wash and reirrigated with saline.  The tourniquet was then dropped and all bleeding vessels were identified and coagulated.  The arthrotomy was approximated with #1 Vicryl and closed with #1 Stratafix suture.  The knee was brought into slight flexion and the subcutaneous tissues were closed with 0 Vicryl, 2-0 Vicryl and a running subcuticular 4-0 stratafix barbed suture.  Skin was then glued with Dermabond.  A sterile adhesive dressing was then placed along with a sequential compression device to the calf, a Ted stocking, and a cryotherapy cuff.   Sponge, needle, and Lap counts were all correct at the end of the case.   The patient was transferred off of the operating room table to a hospital bed, good pulses were found distally on the operative side.  The patient was transferred to the recovery room in stable condition.

## 2024-04-24 NOTE — Transfer of Care (Signed)
 Immediate Anesthesia Transfer of Care Note  Patient: Alison Warren  Procedure(s) Performed: ARTHROPLASTY, KNEE, TOTAL (Left: Knee)  Patient Location: PACU  Anesthesia Type:MAC and Spinal  Level of Consciousness: drowsy, patient cooperative, and responds to stimulation  Airway & Oxygen Therapy: Patient Spontanous Breathing and Patient connected to face mask oxygen  Post-op Assessment: Report given to RN, Post -op Vital signs reviewed and stable, and Patient moving all extremities X 4  Post vital signs: Reviewed and stable  Last Vitals:  Vitals Value Taken Time  BP 117/67 04/24/24 13:18  Temp 36.6 C 04/24/24 13:18  Pulse 70 04/24/24 13:21  Resp 30 04/24/24 13:21  SpO2 100 % 04/24/24 13:21  Vitals shown include unfiled device data.  Last Pain:  Vitals:   04/24/24 0842  TempSrc: Temporal  PainSc: 0-No pain         Complications: No notable events documented.

## 2024-04-24 NOTE — H&P (Signed)
 History of Present Illness: The patient is an 79 y.o. female seen in clinic today for history and physical for left total knee arthroplasty with Dr. Lorelle on 04/21/2024. Patient has severe debilitating left knee pain and instability. X-rays show complete loss of joint space in the medial compartment of the left knee. She lives at home alone and is losing her independence due to feeling as if she is falling. She has had no relief with conservative treatment. She has tried bracing and injections. She has seen Dr. Lorelle discussed and agreed and consented to a left total knee arthroplasty.  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  Degenerative disc disease, cervical 03/23/2020  On CT at [facility] 03/13/2020  Diverticulosis 02/08/2016  DM (diabetes mellitus) (CMS/HHS-HCC)  GERD (gastroesophageal reflux disease)  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   Past Surgical History: Past Surgical History:  Procedure Laterality Date  CHOLECYSTECTOMY 2007  CATARACT EXTRACTION W/ INTRAOCULAR LENS IMPLANT AND ENDOCYCLOPHOTOCOAGULATION Right 11/2014  COLONOSCOPY 02/08/2016  Tubular adenoma of colon/Diverticulosis/Repeat 96yrs/MUS  MASTOIDECTOMY Left 19-26 years old  TONSILLECTOMY   Past Family History: Family History  Problem Relation Age of Onset  Arthritis Mother  Myocardial Infarction (Heart attack) Father   Medications: Current Outpatient Medications  Medication Sig Dispense Refill  acetaminophen  (TYLENOL ) 500 MG tablet Take 1,000 mg by mouth every 8 (eight) hours as needed for Pain  aspirin  81 MG EC tablet Take 81 mg by mouth once daily.  carvediloL  (COREG ) 3.125 MG tablet Take 1 tablet (3.125 mg total) by mouth 2 (two) times daily with meals for blood pressure and heart 180 tablet 3  cholecalciferol (VITAMIN D3) 5,000 unit capsule Take 5,000 Units by mouth once daily  ezetimibe  (ZETIA ) 10 mg tablet Take 1 tablet (10 mg total) by mouth once daily For cholesterol 90 tablet 3  lancets (ONETOUCH DELICA LANCETS) once daily Use as instructed. 100 each 3  metFORMIN  (GLUCOPHAGE -XR) 500 MG XR tablet Take 2 tablets (1,000 mg total) by mouth 2 (two) times daily with meals for diabetes 360 tablet 3  omeprazole  (PRILOSEC) 40 MG DR capsule TAKE 1 CAPSULE (40 MG TOTAL) BY MOUTH EVERY MORNING BEFORE BREAKFAST FOR REFLUX 90 capsule 0  ONETOUCH ULTRA TEST test strip TEST ONCE DAILY AS DIRECTED 100 strip 0  polyethylene glycol (MIRALAX ) powder Take 17 g by mouth once daily as needed   No current facility-administered medications for this visit.   Allergies: Allergies  Allergen Reactions  Duloxetine  Vomiting  Motrin [Ibuprofen] Rash  Tramadol  Vomiting  Sweating    Visit Vitals: There were no vitals filed for this visit.   Review of Systems:  A comprehensive 14 point ROS was performed, reviewed,  and the pertinent orthopaedic findings are documented in the HPI.  Physical Exam: General:  Well developed, well nourished, no apparent distress, normal affect, normal gait with antalgic gait, ambulates with a cane.  HEENT: Head normocephalic, atraumatic, PERRL.   Abdomen: Soft, non tender, non distended, Bowel sounds present.  Heart: Examination of the heart reveals regular, rate, and rhythm. There is no murmur noted on ascultation. There is a normal apical pulse.  Lungs: Lungs are clear to auscultation. There is no wheeze, rhonchi, or crackles. There is normal expansion of bilateral chest walls.   Comprehensive Knee Exam: Gait Non-antalgic and fluid  Alignment Neutral   Inspection Left  Skin Normal appearance with no obvious deformity. No ecchymosis or erythema.  Soft Tissue No focal soft tissue swelling  Quad Atrophy None   Palpation  Left  Tenderness Medial joint line and parapatellar tenderness to palpation  Crepitus + patellofemoral and tibiofemoral crepitus  Effusion None   Range of Motion Left  Flexion 10-80  Extension Noncorrectable 10 degree contracture   Ligamentous Exam Left  Lachman Normal  Valgus 0 Unable to test  Valgus 30 Normal  Varus 0 Unable to test  Varus 30 Normal  Anterior Drawer Normal  Posterior Drawer Normal   Meniscal Exam Left  Hyperflexion Test Positive  Hyperextension Test Positive  McMurray's Positive    Neurovascular Left  Quadriceps Strength 5/5  Hamstring Strength 5/5  Hip Abductor Strength 4/5  Distal Motor Normal  Distal Sensory Normal light touch sensation  Distal Pulses Normal    Imaging Studies: Left knee x-rays reviewed by me today show severe tricompartmental degenerative changes with bone-on-bone articulation joint space narrowing, sclerosis, osteophyte formation and cystic changes Kellgren-Lawrence grade 4. Agree with radiologist interpretation.  MRI of the left knee performed 12/07/2023. Patient has severe  tricompartmental degenerative changes with degenerative tearing of both menisci and cystic changes. No fluid collections no fractures no other concerning features severe degeneration. Agree with radiology interpretation.  Narrative  This result has an attachment that is not available. CLINICAL DATA: Recently increased chronic left knee pain.  EXAM: MRI OF THE LEFT KNEE WITHOUT CONTRAST  TECHNIQUE: Multiplanar, multisequence MR imaging of the knee was performed. No intravenous contrast was administered.  COMPARISON: Left knee radiographs dated 11/20/2023.  FINDINGS: MENISCI  Medial: Maceration and extrusion of the body and anterior horn of the medial meniscus with complex tearing extending to the posterior horn root junction. 2.6 x 0.8 cm T2 hyperintense septated cystic structure adjacent to the inferior aspect of the extruded anterior horn of the medial meniscus may represent a parameniscal cyst (series 103, image 22).  Lateral: Degeneration of the posterior horn of the lateral meniscus.  LIGAMENTS  Cruciates: ACL and PCL are intact.  Collaterals: Medial collateral ligament is intact. Lateral collateral ligament complex is intact.  CARTILAGE  Patellofemoral: Partial-thickness cartilage loss and fissuring of the patellar articular cartilage, most pronounced below the level of the equator.  Medial: Complete loss of the medial femorotibial compartment weight-bearing articular cartilage resulting in subchondral irregularity, cystic changes and edema.  Lateral: Mild-to-moderate partial-thickness cartilage loss of the lateral femorotibial compartment weight-bearing articular cartilage with patchy underlying subchondral marrow edema.  JOINT: Small joint effusion with calcified intra-articular loose bodies in the suprapatellar recess, measuring up to 9 mm. Normal Hoffa's  fat-pad.  POPLITEAL FOSSA: Popliteus tendon is intact. No Baker's cyst.  EXTENSOR MECHANISM: Intact  quadriceps tendon. Intact patellar tendon.  BONES: No acute fracture or dislocation. No discrete marrow replacing lesion. There is varus angulation of the knee secondary to severe medial femorotibial compartment degenerative changes, as described above.  Other: No fluid collection or hematoma. Muscles are within normal limits.  IMPRESSION: 1. Maceration and extrusion of the body and anterior horn of the medial meniscus with complex tearing extending to the posterior horn root junction. A 2.6 x 0.8 cm septated cystic structure adjacent to the inferior aspect of the extruded anterior horn of the medial meniscus may represent a parameniscal cyst. 2. Degeneration of the posterior horn of the lateral meniscus. 3. Severe tricompartmental osteoarthritis, most pronounced in the medial femorotibial compartment with complete loss of the weight-bearing articular cartilage, regions of bone-on-bone contact, and secondary varus angulation. 4. Small joint effusion with calcified intra-articular loose bodies in the suprapatellar recess.  Electronically Signed By: Harrietta Sherry M.D. On: 12/07/2023 11:42  Assessment:  Left knee osteoarthritis  Plan: 79 year old female with advanced left knee degenerative arthritis with complete loss of joint space in the medial compartment left knee. She has severe pain medial joint line, instability. No relief with cortisone injections gel injections and bracing. Risks, benefits, complications of a left total knee arthroplasty were discussed with the patient. Patient has agreed and consented procedure with Dr. Lorelle on 04/21/2024  The hospitalization and post-operative care and rehabilitation were also discussed. The use of perioperative antibiotics and DVT prophylaxis were discussed. The risk, benefits and alternatives to a surgical intervention were discussed at length with the patient. The patient was also advised of risks related to the medical comorbidities  and elevated body mass index (BMI). A lengthy discussion took place to review the most common complications including but not limited to: stiffness, loss of function, complex regional pain syndrome, deep vein thrombosis, pulmonary embolus, heart attack, stroke, infection, wound breakdown, numbness, intraoperative fracture, damage to nerves, tendon,muscles, arteries or other blood vessels, death and other possible complications from anesthesia. The patient was told that we will take steps to minimize these risks by using sterile technique, antibiotics and DVT prophylaxis when appropriate and follow the patient postoperatively in the office setting to monitor progress. The possibility of recurrent pain, no improvement in pain and actual worsening of pain were also discussed with the patient.   All questions answered patient agrees with above plan for left total knee arthroplasty.

## 2024-04-24 NOTE — Interval H&P Note (Signed)
 Patient history and physical updated. Consent reviewed including risks, benefits, and alternatives to surgery. Patient agrees with above plan to proceed with left total knee arthroplasty.

## 2024-04-24 NOTE — Anesthesia Preprocedure Evaluation (Signed)
 "                                  Anesthesia Evaluation  Patient identified by MRN, date of birth, ID band Patient awake    Reviewed: Allergy & Precautions, NPO status , Patient's Chart, lab work & pertinent test results  History of Anesthesia Complications Negative for: history of anesthetic complications  Airway Mallampati: III  TM Distance: >3 FB Neck ROM: full    Dental  (+) Chipped   Pulmonary neg pulmonary ROS   Pulmonary exam normal        Cardiovascular hypertension, On Medications negative cardio ROS Normal cardiovascular exam     Neuro/Psych  Neuromuscular disease  negative psych ROS   GI/Hepatic Neg liver ROS,GERD  Medicated,,  Endo/Other  negative endocrine ROSdiabetes, Type 2, Oral Hypoglycemic Agents    Renal/GU      Musculoskeletal  (+) Arthritis ,    Abdominal   Peds  Hematology negative hematology ROS (+)   Anesthesia Other Findings Past Medical History: No date: Arthritis No date: Bronchitis     Comment:  none currently No date: Chronic cough No date: Costochondritis No date: Diabetes mellitus without complication (HCC)     Comment:  8-9 years No date: Diverticulitis 08/2016: Diverticulitis No date: GERD (gastroesophageal reflux disease) No date: Hypercholesteremia No date: Hypertension 08/19/2014: Hypomagnesemia 08/24/2016: Lactic acidosis No date: Wears hearing aid     Comment:  right ear  Past Surgical History: 12/07/2014: CATARACT EXTRACTION W/PHACO; Right     Comment:  Procedure: CATARACT EXTRACTION PHACO AND INTRAOCULAR               LENS PLACEMENT (IOC);  Surgeon: Donzell Arlyce Budd,              MD;  Location: [facility];  Service:               Ophthalmology;  Laterality: Right;  DIABETIC - oral meds 2007: CHOLECYSTECTOMY 02/08/2016: COLONOSCOPY; N/A     Comment:  Procedure: COLONOSCOPY;  Surgeon: Gladis RAYMOND Mariner, MD;              Location: [facility];  Service: Endoscopy;                 Laterality: N/A;  Diabetes No date: MASTOIDECTOMY No date: THUMB AMPUTATION     Comment:  removal of 2nd thumb No date: TONSILLECTOMY     Reproductive/Obstetrics negative OB ROS                              Anesthesia Physical Anesthesia Plan  ASA: 2  Anesthesia Plan: Spinal   Post-op Pain Management: Toradol  IV (intra-op)*, Ofirmev  IV (intra-op)* and Regional block*   Induction: Intravenous  PONV Risk Score and Plan: 2 and Propofol  infusion and TIVA  Airway Management Planned: Natural Airway and Nasal Cannula  Additional Equipment:   Intra-op Plan:   Post-operative Plan:   Informed Consent: I have reviewed the patients History and Physical, chart, labs and discussed the procedure including the risks, benefits and alternatives for the proposed anesthesia with the patient or authorized representative who has indicated his/her understanding and acceptance.     Dental Advisory Given  Plan Discussed with: Anesthesiologist, CRNA and Surgeon  Anesthesia Plan Comments: (Patient reports no bleeding problems and no anticoagulant use.  Plan for spinal with backup GA  Patient consented for risks of anesthesia including but not limited to:  - adverse reactions to medications - damage to eyes, teeth, lips or other oral mucosa - nerve damage due to positioning  - risk of bleeding, infection and or nerve damage from spinal that could lead to paralysis - risk of headache or failed spinal - damage to teeth, lips or other oral mucosa - sore throat or hoarseness - damage to heart, brain, nerves, lungs, other parts of body or loss of life  Patient voiced understanding and assent.)        Anesthesia Quick Evaluation  "

## 2024-04-24 NOTE — Plan of Care (Signed)
   Problem: Pain Management: Goal: Pain level will decrease with appropriate interventions Outcome: Progressing

## 2024-04-24 NOTE — Progress Notes (Signed)
 Attempted to call son Christopher two times, no answer. No option to leave VM.

## 2024-04-25 ENCOUNTER — Encounter: Payer: Self-pay | Admitting: Orthopedic Surgery

## 2024-04-25 DIAGNOSIS — M1712 Unilateral primary osteoarthritis, left knee: Secondary | ICD-10-CM | POA: Diagnosis not present

## 2024-04-25 LAB — GLUCOSE, CAPILLARY
Glucose-Capillary: 157 mg/dL — ABNORMAL HIGH (ref 70–99)
Glucose-Capillary: 138 mg/dL — ABNORMAL HIGH (ref 70–99)
Glucose-Capillary: 163 mg/dL — ABNORMAL HIGH (ref 70–99)
Glucose-Capillary: 225 mg/dL — ABNORMAL HIGH (ref 70–99)

## 2024-04-25 LAB — CBC
HCT: 39 % (ref 36.0–46.0)
Hemoglobin: 12.1 g/dL (ref 12.0–15.0)
MCH: 25.1 pg — ABNORMAL LOW (ref 26.0–34.0)
MCHC: 31 g/dL (ref 30.0–36.0)
MCV: 80.9 fL (ref 80.0–100.0)
Platelets: 261 K/uL (ref 150–400)
RBC: 4.82 MIL/uL (ref 3.87–5.11)
RDW: 15.7 % — ABNORMAL HIGH (ref 11.5–15.5)
WBC: 13.1 K/uL — ABNORMAL HIGH (ref 4.0–10.5)
nRBC: 0 % (ref 0.0–0.2)

## 2024-04-25 LAB — BASIC METABOLIC PANEL WITH GFR
Anion gap: 12 (ref 5–15)
BUN: 12 mg/dL (ref 8–23)
CO2: 24 mmol/L (ref 22–32)
Calcium: 9.9 mg/dL (ref 8.9–10.3)
Chloride: 104 mmol/L (ref 98–111)
Creatinine, Ser: 0.79 mg/dL (ref 0.44–1.00)
GFR, Estimated: >60 mL/min
Glucose, Bld: 161 mg/dL — ABNORMAL HIGH (ref 70–99)
Potassium: 4.2 mmol/L (ref 3.5–5.1)
Sodium: 139 mmol/L (ref 135–145)

## 2024-04-25 MED ORDER — ENOXAPARIN SODIUM 40 MG/0.4ML IJ SOSY: 40.0000 mg | PREFILLED_SYRINGE | INTRAMUSCULAR | 0 refills | Status: DC

## 2024-04-25 MED ORDER — ACETAMINOPHEN 500 MG PO TABS
ORAL_TABLET | ORAL | Status: AC
  Filled 2024-04-25: qty 2

## 2024-04-25 MED ORDER — EZETIMIBE 10 MG PO TABS
ORAL_TABLET | ORAL | Status: AC
  Filled 2024-04-25: qty 1

## 2024-04-25 MED ORDER — HYDROCODONE-ACETAMINOPHEN 5-325 MG PO TABS: 1.0000 | ORAL_TABLET | ORAL | 0 refills | Status: DC | PRN

## 2024-04-25 NOTE — Progress Notes (Signed)
 Physical Therapy Treatment Patient Details Name: Alison Warren MRN: [id number] DOB: 05/08/1945 Today's Date: 04/25/2024   History of Present Illness Pt is an 79 y/o F admitted on 04/24/24 for scheduled L TKA. PMH: aortic atherosclerosis, cardiomyopathy, chronic LBP with B sciatica, liver cirrhosis without ascites, cervical DDD, congenital deformity of R foot, diverticulosis, DM, GERD, hearing loss, HTN, HLD, hypomagnesemia    PT Comments  Pt seen for PT tx with pt agreeable. Used written communication to communicate with pt during session. Pt is able to increase gait distance with RW & supervision, notes some L knee pain at end of session. Will see pt again tomorrow to practice stair negotiation as able. Continue to recommend ongoing PT services.    If plan is discharge home, recommend the following: A little help with walking and/or transfers;A little help with bathing/dressing/bathroom;Assistance with cooking/housework;Assist for transportation;Help with stairs or ramp for entrance   Can travel by private vehicle     Yes  Equipment Recommendations  Rolling walker (2 wheels);BSC/3in1    Recommendations for Other Services       Precautions / Restrictions Precautions Precautions: Fall Restrictions Weight Bearing Restrictions Per Provider Order: Yes LLE Weight Bearing Per Provider Order: Weight bearing as tolerated     Mobility  Bed Mobility               General bed mobility comments: not tested, pt received & left sitting in recliner    Transfers Overall transfer level: Needs assistance Equipment used: Rolling walker (2 wheels) Transfers: Sit to/from Stand Sit to Stand: Supervision           General transfer comment: transfer sit<>stand from recliner    Ambulation/Gait Ambulation/Gait assistance: Supervision Gait Distance (Feet): 200 Feet Assistive device: Rolling walker (2 wheels) Gait Pattern/deviations: Step-through pattern, Decreased stride length Gait  velocity: decreased     General Gait Details: slightly decreased LLE dorsiflexion   Stairs             Wheelchair Mobility     Tilt Bed    Modified Rankin (Stroke Patients Only)       Balance Overall balance assessment: Needs assistance Sitting-balance support: Feet supported Sitting balance-Leahy Scale: Good Sitting balance - Comments: performs peri hygiene on BSC   Standing balance support: During functional activity, Bilateral upper extremity supported, Reliant on assistive device for balance Standing balance-Leahy Scale: Fair                              Hotel Manager: Impaired Factors Affecting Communication: Hearing impaired  Cognition Arousal: Alert Behavior During Therapy: WFL for tasks assessed/performed   PT - Cognitive impairments: Difficult to assess Difficult to assess due to: Hard of hearing/deaf                     PT - Cognition Comments: Pt pleasant, follows simple commands throughout session. Following commands: Impaired Following commands impaired: Follows one step commands with increased time    Cueing Cueing Techniques: Verbal cues, Gestural cues, Tactile cues, Visual cues  Exercises Total Joint Exercises Long Arc Quad: AROM, Seated, Left, Strengthening, 20 reps    General Comments General comments (skin integrity, edema, etc.): cont void on toilet, fatigue with ambulation      Pertinent Vitals/Pain Pain Assessment Pain Assessment: Faces Faces Pain Scale: Hurts a little bit Pain Location: anterior L knee after gait Pain Descriptors / Indicators: Discomfort Pain Intervention(s): Monitored during  session, Limited activity within patient's tolerance (polar care donned at beginning & end of session)    Home Living Family/patient expects to be discharged to:: Private residence Living Arrangements: Alone Available Help at Discharge: Family;Available PRN/intermittently (no family can  provide 24 hr assistance at d/c) Type of Home: House Home Access: Stairs to enter Entrance Stairs-Rails: Right;Left;Can reach both Entrance Stairs-Number of Steps: 5-6   Home Layout: One level Home Equipment: Cane - single point      Prior Function            PT Goals (current goals can now be found in the care plan section) Acute Rehab PT Goals Patient Stated Goal: go to rehab PT Goal Formulation: With family Time For Goal Achievement: 05/09/24 Potential to Achieve Goals: Fair Progress towards PT goals: Progressing toward goals    Frequency    BID      PT Plan      Co-evaluation              AM-PAC PT 6 Clicks Mobility   Outcome Measure  Help needed turning from your back to your side while in a flat bed without using bedrails?: None Help needed moving from lying on your back to sitting on the side of a flat bed without using bedrails?: A Little Help needed moving to and from a bed to a chair (including a wheelchair)?: A Little Help needed standing up from a chair using your arms (e.g., wheelchair or bedside chair)?: A Little Help needed to walk in hospital room?: A Little Help needed climbing 3-5 steps with a railing? : A Little 6 Click Score: 19    End of Session   Activity Tolerance: Patient tolerated treatment well Patient left: in chair;with call bell/phone within reach Nurse Communication: Mobility status PT Visit Diagnosis: Other abnormalities of gait and mobility (R26.89);Difficulty in walking, not elsewhere classified (R26.2);Muscle weakness (generalized) (M62.81)     Time: 8465-8451 PT Time Calculation (min) (ACUTE ONLY): 14 min  Charges:    $Therapeutic Activity: 8-22 mins PT General Charges $$ ACUTE PT VISIT: 1 Visit                     Alison Warren, PT, DPT 04/25/24, 4:02 PM   Alison Warren 04/25/2024, 4:01 PM

## 2024-04-25 NOTE — Evaluation (Signed)
 Physical Therapy Evaluation Patient Details Name: Alison Warren MRN: [id number] DOB: 1945-07-14 Today's Date: 04/25/2024  History of Present Illness  Pt is an 79 y/o F admitted on 04/24/24 for scheduled L TKA. PMH: aortic atherosclerosis, cardiomyopathy, chronic LBP with B sciatica, liver cirrhosis without ascites, cervical DDD, congenital deformity of R foot, diverticulosis, DM, GERD, hearing loss, HTN, HLD, hypomagnesemia  Clinical Impression  Pt seen for PT evaluation with pt agreeable to tx. Session limited by pt HOH (even with hearing aides, inability to read lips) & decreased vision -- used large print written communication during session. Pt is very pleasant, eager to participate. Pt is able to ambulate with RW & CGA but notes fatigue at end of session. Per son (via telephone), no family can provide assistance at d/c. Pt is unsafe to d/c home alone at this time. Recommend ongoing PT services to progress mobility as able.        If plan is discharge home, recommend the following: A little help with walking and/or transfers;A little help with bathing/dressing/bathroom;Assistance with cooking/housework;Assist for transportation;Help with stairs or ramp for entrance   Can travel by private vehicle   Yes    Equipment Recommendations Other (comment) (defer to next venue)  Recommendations for Other Services       Functional Status Assessment Patient has had a recent decline in their functional status and demonstrates the ability to make significant improvements in function in a reasonable and predictable amount of time.     Precautions / Restrictions Precautions Precautions: Fall Restrictions Weight Bearing Restrictions Per Provider Order: Yes LLE Weight Bearing Per Provider Order: Weight bearing as tolerated      Mobility  Bed Mobility               General bed mobility comments: not tested, pt received & left sitting in recliner    Transfers Overall transfer level:  Needs assistance Equipment used: Rolling walker (2 wheels) Transfers: Sit to/from Stand Sit to Stand: Contact guard assist           General transfer comment: sit>stand from recliner, sit>stand from [facility] with RW    Ambulation/Gait Ambulation/Gait assistance: Contact guard assist Gait Distance (Feet): 150 Feet Assistive device: Rolling walker (2 wheels) Gait Pattern/deviations: Decreased step length - right, Decreased step length - left, Decreased dorsiflexion - left, Decreased stride length Gait velocity: decreased     General Gait Details: slightly decreased LLE dorsiflexion  Stairs            Wheelchair Mobility     Tilt Bed    Modified Rankin (Stroke Patients Only)       Balance Overall balance assessment: Needs assistance Sitting-balance support: Feet supported Sitting balance-Leahy Scale: Good Sitting balance - Comments: performs peri hygiene on BSC   Standing balance support: Bilateral upper extremity supported, During functional activity, Reliant on assistive device for balance Standing balance-Leahy Scale: Fair                               Pertinent Vitals/Pain Pain Assessment Pain Assessment: Faces Faces Pain Scale: Hurts a little bit Pain Location: anterior L knee Pain Descriptors / Indicators: Discomfort Pain Intervention(s): Limited activity within patient's tolerance, Monitored during session    Home Living Family/patient expects to be discharged to:: Private residence Living Arrangements: Alone Available Help at Discharge: Family;Available PRN/intermittently (no family can provide 24 hr assistance at d/c) Type of Home: House Home Access: Stairs to  enter Entrance Stairs-Rails: Right;Left;Can reach both Entrance Stairs-Number of Steps: 5-6   Home Layout: One level Home Equipment: Cane - single point      Prior Function Prior Level of Function : Needs assist             Mobility Comments: Ambulatory with SPC, denies  falls. ADLs Comments: Son provides transportation to the grocery store. Bathes & dresses without assistance. Son provides supervision for med management & provides meals that pt only has to heat in microwave.     Extremity/Trunk Assessment   Upper Extremity Assessment Upper Extremity Assessment: Overall WFL for tasks assessed    Lower Extremity Assessment Lower Extremity Assessment: LLE deficits/detail LLE Deficits / Details: 2/2 L TKA       Communication   Communication Communication: Impaired Factors Affecting Communication: Hearing impaired    Cognition Arousal: Alert Behavior During Therapy: WFL for tasks assessed/performed   PT - Cognitive impairments: Difficult to assess Difficult to assess due to: Hard of hearing/deaf (impaired vision)                     PT - Cognition Comments: Pt pleasant, follows simple commands throughout session. Following commands: Impaired Following commands impaired: Follows one step commands with increased time     Cueing Cueing Techniques: Verbal cues, Gestural cues, Tactile cues, Visual cues     General Comments General comments (skin integrity, edema, etc.): continent void on BSC. At end of session pt reports That was a day's work, SPO2 >90% on room air.    Exercises     Assessment/Plan    PT Assessment Patient needs continued PT services  PT Problem List Decreased range of motion;Decreased activity tolerance;Decreased balance;Decreased mobility;Decreased knowledge of precautions;Decreased safety awareness;Decreased knowledge of use of DME;Decreased strength       PT Treatment Interventions DME instruction;Therapeutic exercise;Gait training;Balance training;Stair training;Neuromuscular re-education;Functional mobility training;Therapeutic activities;Patient/family education    PT Goals (Current goals can be found in the Care Plan section)  Acute Rehab PT Goals Patient Stated Goal: go to rehab PT Goal Formulation:  With family Time For Goal Achievement: 05/09/24 Potential to Achieve Goals: Good    Frequency BID     Co-evaluation               AM-PAC PT 6 Clicks Mobility  Outcome Measure Help needed turning from your back to your side while in a flat bed without using bedrails?: None Help needed moving from lying on your back to sitting on the side of a flat bed without using bedrails?: A Little Help needed moving to and from a bed to a chair (including a wheelchair)?: A Little Help needed standing up from a chair using your arms (e.g., wheelchair or bedside chair)?: A Little Help needed to walk in hospital room?: A Little Help needed climbing 3-5 steps with a railing? : A Little 6 Click Score: 19    End of Session   Activity Tolerance: Patient tolerated treatment well Patient left: in chair;with call bell/phone within reach Nurse Communication: Mobility status PT Visit Diagnosis: Other abnormalities of gait and mobility (R26.89);Difficulty in walking, not elsewhere classified (R26.2);Muscle weakness (generalized) (M62.81)    Time: 9065-9044 PT Time Calculation (min) (ACUTE ONLY): 21 min   Charges:   PT Evaluation $PT Eval Low Complexity: 1 Low   PT General Charges $$ ACUTE PT VISIT: 1 Visit         Richerd Pinal, PT, DPT 04/25/24, 1:35 PM   Richerd CHRISTELLA Pinal  04/25/2024, 1:34 PM

## 2024-04-25 NOTE — NC FL2 (Signed)
 " [place]  MEDICAID FL2 LEVEL OF CARE FORM     IDENTIFICATION  Patient Name: Alison Warren Birthdate: June 23, 1945 Sex: female Admission Date (Current Location): 04/24/2024  [facility] and [place] Number:  Chiropodist and Address:  [facility], [address] [place], [place] [address]      Provider Number: [id number]  Attending Physician Name and Address:  Lorelle Hussar, MD  Relative Name and Phone Number:  Shaun, Zuccaro, Emergency Contact  [phone]    Current Level of Care: Hospital Recommended Level of Care: Skilled Nursing Facility Prior Approval Number:    Date Approved/Denied:   PASRR Number: [id number]  Discharge Plan: SNF    Current Diagnoses: Patient Active Problem List   Diagnosis Date Noted   S/P TKR (total knee replacement), left 04/24/2024   Sebaceous cyst 09/27/2023   Chest pain 09/19/2023   Other specified injury of long flexor muscle, fascia and tendon of right thumb at wrist and hand level, subsequent encounter 06/25/2023   Pruritic disorder 07/26/2022   Cirrhosis of liver without ascites, unspecified hepatic cirrhosis type (HCC) 07/05/2021   Benign essential hypertension 12/20/2020   Degenerative disc disease, cervical 03/23/2020   Chronic bilateral low back pain with bilateral sciatica 02/12/2020   Lumbar spondylosis with myelopathy 02/09/2020   Aortic atherosclerosis 01/19/2020   GERD (gastroesophageal reflux disease) 11/21/2018   Hyperlipidemia associated with type 2 diabetes mellitus (HCC) 11/21/2018   Primary osteoarthritis of left knee 05/09/2018   B12 deficiency 04/30/2018   Obesity (BMI 30.0-34.9) 09/14/2017   Hepatic steatosis 02/26/2017   Diverticulitis of colon 09/22/2016   Vitamin D  deficiency 09/01/2016   Type II diabetes mellitus with complication (HCC) 08/24/2016   Hx of adenomatous colonic polyps 02/24/2016   NAFLD (nonalcoholic fatty liver disease) 90/92/7982   Osteopenia of multiple  sites 09/09/2015   Seasonal allergic rhinitis due to pollen 08/24/2015   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/2015   Abnormal mammogram with microcalcification 07/02/2014   Muscle cramp, nocturnal 08/20/2013   Hearing loss of both ears 08/19/2013   Blindness of left eye with normal vision in contralateral eye 05/14/2006    Orientation RESPIRATION BLADDER Height & Weight     Self, Time, Place, Situation  Normal Continent Weight:   Height:     BEHAVIORAL SYMPTOMS/MOOD NEUROLOGICAL BOWEL NUTRITION STATUS      Continent Diet  AMBULATORY STATUS COMMUNICATION OF NEEDS Skin   Supervision Verbally Surgical wounds                       Personal Care Assistance Level of Assistance  Bathing, Dressing Bathing Assistance: Limited assistance   Dressing Assistance: Limited assistance     Functional Limitations Info  Sight, Hearing Sight Info: Impaired Hearing Info: Impaired      SPECIAL CARE FACTORS FREQUENCY  PT (By licensed PT), OT (By licensed OT)     PT Frequency: 2x OT Frequency: 2x            Contractures Contractures Info: Not present    Additional Factors Info  Code Status, Allergies Code Status Info: FULL Allergies Info: Tramadol ; Motrin           Current Medications (04/25/2024):  This is the current hospital active medication list Current Facility-Administered Medications  Medication Dose Route Frequency Provider Last Rate Last Admin   0.9 %  sodium chloride  infusion   Intravenous Continuous Aberman, Zachary, MD   Stopped at 04/25/24 0311   acetaminophen  (TYLENOL ) tablet 1,000  mg  1,000 mg Oral Q8H Aberman, Zachary, MD   1,000 mg at 04/25/24 1116   acetaminophen  (TYLENOL ) tablet 325-650 mg  325-650 mg Oral Q6H PRN Aberman, Zachary, MD       amLODipine  (NORVASC ) tablet 5 mg  5 mg Oral Daily Aberman, Zachary, MD   5 mg at 04/25/24 1025   carvedilol  (COREG ) tablet 3.125 mg  3.125 mg Oral BID WC Aberman, Zachary, MD   3.125 mg at 04/25/24 0818   docusate  sodium (COLACE) capsule 100 mg  100 mg Oral BID Aberman, Zachary, MD   100 mg at 04/25/24 1025   enoxaparin  (LOVENOX ) injection 30 mg  30 mg Subcutaneous Q12H Aberman, Zachary, MD   30 mg at 04/25/24 9180   ezetimibe  (ZETIA ) tablet 10 mg  10 mg Oral Daily Aberman, Zachary, MD   10 mg at 04/25/24 1024   HYDROcodone -acetaminophen  (NORCO) 7.5-325 MG per tablet 1 tablet  1 tablet Oral Q4H PRN Aberman, Zachary, MD       HYDROcodone -acetaminophen  (NORCO/VICODIN) 5-325 MG per tablet 1 tablet  1 tablet Oral Q4H PRN Aberman, Zachary, MD       insulin  aspart (novoLOG ) injection 0-15 Units  0-15 Units Subcutaneous TID WC Aberman, Zachary, MD   2 Units at 04/25/24 1307   insulin  aspart (novoLOG ) injection 0-5 Units  0-5 Units Subcutaneous QHS Aberman, Zachary, MD   4 Units at 04/24/24 2131   menthol  (CEPACOL) lozenge 3 mg  1 lozenge Oral PRN Aberman, Zachary, MD       Or   phenol (CHLORASEPTIC) mouth spray 1 spray  1 spray Mouth/Throat PRN Aberman, Zachary, MD       metoCLOPramide  (REGLAN ) tablet 5-10 mg  5-10 mg Oral Q8H PRN Aberman, Zachary, MD       Or   metoCLOPramide  (REGLAN ) injection 5-10 mg  5-10 mg Intravenous Q8H PRN Aberman, Zachary, MD       morphine  (PF) 2 MG/ML injection 0.5-1 mg  0.5-1 mg Intravenous Q2H PRN Aberman, Zachary, MD       ondansetron  (ZOFRAN ) tablet 4 mg  4 mg Oral Q6H PRN Aberman, Zachary, MD       Or   ondansetron  (ZOFRAN ) injection 4 mg  4 mg Intravenous Q6H PRN Aberman, Zachary, MD       pantoprazole  (PROTONIX ) EC tablet 40 mg  40 mg Oral Daily Aberman, Zachary, MD   40 mg at 04/25/24 9181     Discharge Medications: Please see discharge summary for a list of discharge medications.  Relevant Imaging Results:  Relevant Lab Results:   Additional Information [id number]  Isiaih Hollenbach L Alaya Iverson, LCSW     "

## 2024-04-25 NOTE — TOC Progression Note (Signed)
 Transition of Care [facility]) - Progression Note    Patient Details  Name: Alison Warren MRN: [id number] Date of Birth: 02-02-1946  [facility] CM/SW Contact  Takera Rayl L Tyresse [place], [place] Phone Number: 04/25/2024, 6:08 PM  Clinical Narrative:      SNF approved by insurance. CSW reached out to Shona Chang, [place] to no avail. HIPAA compliant voicemail left.                    Expected Discharge Plan and Services                                               Social Drivers of Health (SDOH) Interventions SDOH Screenings   Food Insecurity: No Food Insecurity (04/24/2024)  Housing: Low Risk (04/24/2024)  Transportation Needs: No Transportation Needs (04/24/2024)  Utilities: Not At Risk (04/24/2024)  Alcohol Screen: Low Risk (05/09/2023)  Depression (PHQ2-9): Low Risk (04/08/2024)  Financial Resource Strain: Low Risk  (04/14/2024)   Received from [facility]  Physical Activity: Insufficiently Active (05/09/2023)  Social Connections: Socially Isolated (04/24/2024)  Stress: No Stress Concern Present (02/22/2022)  Tobacco Use: Low Risk (04/24/2024)  Health Literacy: Adequate Health Literacy (05/09/2023)    Readmission Risk Interventions     No data to display

## 2024-04-25 NOTE — Plan of Care (Signed)
" °  Problem: Respiratory: Goal: Respiratory status will improve Outcome: Progressing   Problem: Activity: Goal: Ability to avoid complications of mobility impairment will improve Outcome: Progressing   Problem: Pain Management: Goal: Pain level will decrease with appropriate interventions Outcome: Progressing   "

## 2024-04-25 NOTE — Discharge Instructions (Signed)
 TOTAL KNEE REPLACEMENT POSTOPERATIVE DIRECTIONS  Knee Rehabilitation, Guidelines Following Surgery  Results after knee surgery are often greatly improved when you follow the exercise, range of motion and muscle strengthening exercises prescribed by your doctor. Safety measures are also important to protect the knee from further injury. Any time any of these exercises cause you to have increased pain or swelling in your knee joint, decrease the amount until you are comfortable again and slowly increase them. If you have problems or questions, call your caregiver or physical therapist for advice.   HOME CARE INSTRUCTIONS  Remove items at home which could result in a fall. This includes throw rugs or furniture in walking pathways.  ICE using the Polar Care unit to the affected knee every three hours for 30 minutes at a time and then as needed for pain and swelling.  Place a dry towel or pillow case over the knee before applying the Polar Care Unit.  Continue to use ice on the knee for pain and swelling from surgery. You may notice swelling that will progress down to the foot and ankle.  This is normal after surgery.  Elevate the leg when you are not up walking on it.   Continue to use the breathing machine which will help keep your temperature down.  It is common for your temperature to cycle up and down following surgery, especially at night when you are not up moving around and exerting yourself.  The breathing machine keeps your lungs expanded and your temperature down. Do not place pillow under knee, focus on keeping the knee straight while resting  DIET You may resume your previous home diet once your are discharged from the hospital.  DRESSING / WOUND CARE / SHOWERING Change the dressing at 7 days.  Change to a new honeycomb or island bandage. You need to keep your wound dry after being discharged home.  Just keep the incision dry and apply a dry gauze dressing on daily. Change the surgical  dressing only if needed and reapply a dry dressing each time.  ACTIVITY Walk with your walker as instructed. Use walker as long as suggested by your caregivers. Avoid periods of inactivity such as sitting longer than an hour when not asleep. This helps prevent blood clots.  You may resume a sexual relationship in one month or when given the OK by your doctor.  You may return to work once you are cleared by your doctor.  Do not drive a car for 6 weeks or until released by you surgeon.  Do not drive while taking narcotics.  WEIGHT BEARING Weight bearing as tolerated with assist device (walker, cane, etc) as directed, use it as long as suggested by your surgeon or therapist, typically at least 4-6 weeks.  POSTOPERATIVE CONSTIPATION PROTOCOL Constipation - defined medically as fewer than three stools per week and severe constipation as less than one stool per week.  One of the most common issues patients have following surgery is constipation.  Even if you have a regular bowel pattern at home, your normal regimen is likely to be disrupted due to multiple reasons following surgery.  Combination of anesthesia, postoperative narcotics, change in appetite and fluid intake all can affect your bowels.  In order to avoid complications following surgery, here are some recommendations in order to help you during your recovery period.  Colace (docusate) - Pick up an over-the-counter form of Colace or another stool softener and take twice a day as long as you are requiring  postoperative pain medications.  Take with a full glass of water daily.  If you experience loose stools or diarrhea, hold the colace until you stool forms back up.  If your symptoms do not get better within 1 week or if they get worse, check with your doctor.  Dulcolax (bisacodyl) - Pick up over-the-counter and take as directed by the product packaging as needed to assist with the movement of your bowels.  Take with a full glass of water.   Use this product as needed if not relieved by Colace only.   MiraLax  (polyethylene glycol) - Pick up over-the-counter to have on hand.  MiraLax  is a solution that will increase the amount of water in your bowels to assist with bowel movements.  Take as directed and can mix with a glass of water, juice, soda, coffee, or tea.  Take if you go more than two days without a movement. Do not use MiraLax  more than once per day. Call your doctor if you are still constipated or irregular after using this medication for 7 days in a row.  If you continue to have problems with postoperative constipation, please contact the office for further assistance and recommendations.  If you experience the worst abdominal pain ever or develop nausea or vomiting, please contact the office immediatly for further recommendations for treatment.  ITCHING  If you experience itching with your medications, try taking only a single pain pill, or even half a pain pill at a time.  You can also use Benadryl  over the counter for itching or also to help with sleep.   TED HOSE STOCKINGS Wear the elastic stockings on both legs for six weeks following surgery during the day but you may remove then at night for sleeping.  MEDICATIONS See your medication summary on the After Visit Summary that the nursing staff will review with you prior to discharge.  You may have some home medications which will be placed on hold until you complete the course of blood thinner medication.  It is important for you to complete the blood thinner medication as prescribed by your surgeon.  Continue your approved medications as instructed at time of discharge.  PRECAUTIONS If you experience chest pain or shortness of breath - call 911 immediately for transfer to the hospital emergency department.  If you develop a fever greater that 101 F, purulent drainage from wound, increased redness or drainage from wound, foul odor from the wound/dressing, or calf pain  - CONTACT YOUR SURGEON.                                                   FOLLOW-UP APPOINTMENTS Make sure you keep all of your appointments after your operation with your surgeon and caregivers. You should call the office at the above phone number and make an appointment for approximately two weeks after the date of your surgery or on the date instructed by your surgeon outlined in the After Visit Summary.   RANGE OF MOTION AND STRENGTHENING EXERCISES  Rehabilitation of the knee is important following a knee injury or an operation. After just a few days of immobilization, the muscles of the thigh which control the knee become weakened and shrink (atrophy). Knee exercises are designed to build up the tone and strength of the thigh muscles and to improve knee motion. Often times heat  used for twenty to thirty minutes before working out will loosen up your tissues and help with improving the range of motion but do not use heat for the first two weeks following surgery. These exercises can be done on a training (exercise) mat, on the floor, on a table or on a bed. Use what ever works the best and is most comfortable for you Knee exercises include:  Leg Lifts - While your knee is still immobilized in a splint or cast, you can do straight leg raises. Lift the leg to 60 degrees, hold for 3 sec, and slowly lower the leg. Repeat 10-20 times 2-3 times daily. Perform this exercise against resistance later as your knee gets better.  Quad and Hamstring Sets - Tighten up the muscle on the front of the thigh (Quad) and hold for 5-10 sec. Repeat this 10-20 times hourly. Hamstring sets are done by pushing the foot backward against an object and holding for 5-10 sec. Repeat as with quad sets.  Leg Slides: Lying on your back, slowly slide your foot toward your buttocks, bending your knee up off the floor (only go as far as is comfortable). Then slowly slide your foot back down until your leg is flat on the floor  again. Angel Wings: Lying on your back spread your legs to the side as far apart as you can without causing discomfort.  A rehabilitation program following serious knee injuries can speed recovery and prevent re-injury in the future due to weakened muscles. Contact your doctor or a physical therapist for more information on knee rehabilitation.   IF YOU ARE TRANSFERRED TO A SKILLED REHAB FACILITY If the patient is transferred to a skilled rehab facility following release from the hospital, a list of the current medications will be sent to the facility for the patient to continue.  When discharged from the skilled rehab facility, please have the facility set up the patient's Home Health Physical Therapy prior to being released. Also, the skilled facility will be responsible for providing the patient with their medications at time of release from the facility to include their pain medication, the muscle relaxants, and their blood thinner medication. If the patient is still at the rehab facility at time of the two week follow up appointment, the skilled rehab facility will also need to assist the patient in arranging follow up appointment in our office and any transportation needs.  MAKE SURE YOU:  Understand these instructions.  Get help right away if you are not doing well or get worse.    Pick up stool softner and laxative for home use following surgery while on pain medications. Do not submerge incision under water. Please use good hand washing techniques while changing dressing each day. May shower starting three days after surgery. Please use a clean towel to pat the incision dry following showers. Continue to use ice for pain and swelling after surgery. Do not use any lotions or creams on the incision until instructed by your surgeon.

## 2024-04-25 NOTE — Progress Notes (Signed)
 Transfer Note:   Pt transferred to room 133. Pt's belongings transferred as well including a right hearing aid, bone foam, polar care, and purse. Pt's son Christopher notified as well as Dr. Lorelle. Pt voices no questions or concerns at this time. Nurse gave bedside report to RN [place].

## 2024-04-25 NOTE — TOC Initial Note (Addendum)
 Transition of Care [facility]) - Initial/Assessment Note    Patient Details  Name: Alison Warren MRN: [id number] Date of Birth: [birth year]  [facility] CM/SW Contact:    Lihanna Biever L Carizma Dunsworth, LCSW Phone Number: 04/25/2024, 4:59 PM  Clinical Narrative:                    CSW attempted to complete assessment with patient. PT was in room. No family at bedside. CSW advised by PT that patient is hearing and visually impaired. This information was unbeknownst to CSW.   CSW contacted the patient's son, Christopher. Christopher advised that he advised the surgeon's office that patient lived alone and she had limited social support, prior to surgery. Family wants SNF placement. Patient is ambulating and walking 150 feet.   Christopher advised that he does not have the financial means to place his mother in LTC. CSW advised that if insurance does not authorize the SNF placement, patient would need to return home with home health services. Christopher advised that his mother burns herself cooking. He advised that she can't take care of herself.   Christopher stated that he and his wife try to go to the home to assist patient. CSW inquired if the family has considered PACE program. CSW provided contact information for family to follow-up.   CSW sent secure chat to financial navigator at [facility] to have a follow-up conversation with the family about LTC Medicaid.   Christopher advised that patient receives $1900.00 a month.   FL2 was completed and bed search initiated.    5:20pm: CSW reviewed bed offers with family. Family accepts bed offer at [facility]. Bed accepted and shara will be started.      Patient Goals and CMS Choice            Expected Discharge Plan and Services                                              Prior Living Arrangements/Services                       Activities of Daily Living   ADL Screening (condition at time of admission) Independently performs ADLs?: Yes (appropriate  for developmental age) Is the patient deaf or have difficulty hearing?: Yes Does the patient have difficulty seeing, even when wearing glasses/contacts?: Yes Does the patient have difficulty concentrating, remembering, or making decisions?: No  Permission Sought/Granted                  Emotional Assessment              Admission diagnosis:  Primary osteoarthritis of left knee [M17.12] S/P TKR (total knee replacement), left [Z96.652] Patient Active Problem List   Diagnosis Date Noted   S/P TKR (total knee replacement), left 04/24/2024   Sebaceous cyst 09/27/2023   Chest pain 09/19/2023   Other specified injury of long flexor muscle, fascia and tendon of right thumb at wrist and hand level, subsequent encounter 06/25/2023   Pruritic disorder 07/26/2022   Cirrhosis of liver without ascites, unspecified hepatic cirrhosis type (HCC) 07/05/2021   Benign essential hypertension 12/20/2020   Degenerative disc disease, cervical 03/23/2020   Chronic bilateral low back pain with bilateral sciatica 02/12/2020   Lumbar spondylosis with myelopathy 02/09/2020   Aortic atherosclerosis 01/19/2020   GERD (gastroesophageal  reflux disease) 11/21/2018   Hyperlipidemia associated with type 2 diabetes mellitus (HCC) 11/21/2018   Primary osteoarthritis of left knee 05/09/2018   B12 deficiency 04/30/2018   Obesity (BMI 30.0-34.9) 09/14/2017   Hepatic steatosis 02/26/2017   Diverticulitis of colon 09/22/2016   Vitamin D  deficiency 09/01/2016   Type II diabetes mellitus with complication (HCC) 08/24/2016   Hx of adenomatous colonic polyps 02/24/2016   NAFLD (nonalcoholic fatty liver disease) 90/92/7982   Osteopenia of multiple sites 09/09/2015   Seasonal allergic rhinitis due to pollen 08/24/2015   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/2015   Abnormal mammogram with microcalcification 07/02/2014   Muscle cramp, nocturnal 08/20/2013   Hearing loss of both ears 08/19/2013   Blindness of left eye  with normal vision in contralateral eye 05/14/2006   PCP:  Justus Leita DEL, MD Pharmacy:   [facility] [phone] [facility] [place] - [address] [place] [address] Phone: [phone] Fax: [phone]     Social Drivers of Health (SDOH) Social History: SDOH Screenings   Food Insecurity: No Food Insecurity (04/24/2024)  Housing: Low Risk (04/24/2024)  Transportation Needs: No Transportation Needs (04/24/2024)  Utilities: Not At Risk (04/24/2024)  Alcohol Screen: Low Risk (05/09/2023)  Depression (PHQ2-9): Low Risk (04/08/2024)  Financial Resource Strain: Low Risk  (04/14/2024)   Received from [facility]  Physical Activity: Insufficiently Active (05/09/2023)  Social Connections: Socially Isolated (04/24/2024)  Stress: No Stress Concern Present (02/22/2022)  Tobacco Use: Low Risk (04/24/2024)  Health Literacy: Adequate Health Literacy (05/09/2023)   SDOH Interventions:     Readmission Risk Interventions     No data to display

## 2024-04-25 NOTE — Progress Notes (Signed)
" °  Subjective: 1 Day Post-Op Procedures (LRB): ARTHROPLASTY, KNEE, TOTAL (Left) Patient reports pain as mild.   Patient is well, and has had no acute complaints or problems Plan is to go Rehab after hospital stay. Negative for chest pain and shortness of breath Fever: no Gastrointestinal: Negative for nausea and vomiting  Objective: Vital signs in last 24 hours: Temp:  [97 F (36.1 C)-98 F (36.7 C)] 97.8 F (36.6 C) (01/16 0430) Pulse Rate:  [70-99] 76 (01/16 0430) Resp:  [11-26] 18 (01/16 0430) BP: (117-159)/(63-93) 128/70 (01/16 0430) SpO2:  [89 %-100 %] 96 % (01/16 0430)  Intake/Output from previous day:  Intake/Output Summary (Last 24 hours) at 04/25/2024 0715 Last data filed at 04/25/2024 0300 Gross per 24 hour  Intake 2060.8 ml  Output 750 ml  Net 1310.8 ml    Intake/Output this shift: No intake/output data recorded.  Labs: No results for input(s): HGB in the last 72 hours. No results for input(s): WBC, RBC, HCT, PLT in the last 72 hours. No results for input(s): NA, K, CL, CO2, BUN, CREATININE, GLUCOSE, CALCIUM  in the last 72 hours. No results for input(s): LABPT, INR in the last 72 hours.   EXAM General - Patient is Alert and Oriented Extremity - Sensation intact distally Intact pulses distally Dorsiflexion/Plantar flexion intact Compartment soft Dressing/Incision - clean, dry, no drainage Motor Function - intact, moving foot and toes well on exam.  Able to straight leg raise with minimal assist.  Past Medical History:  Diagnosis Date   Arthritis    Bronchitis    none currently   Chronic cough    Costochondritis    Diabetes mellitus without complication (HCC)    8-9 years   Diverticulitis    Diverticulitis 08/2016   GERD (gastroesophageal reflux disease)    Hypercholesteremia    Hypertension    Hypomagnesemia 08/19/2014   Lactic acidosis 08/24/2016   Wears hearing aid    right ear    Assessment/Plan: 1 Day  Post-Op Procedures (LRB): ARTHROPLASTY, KNEE, TOTAL (Left) Principal Problem:   S/P TKR (total knee replacement), left  Estimated body mass index is 30.08 kg/m as calculated from the following:   Height as of 04/17/24: 5' (1.524 m).   Weight as of 04/17/24: 69.9 kg. Advance diet Up with therapy D/C IV fluids  Discharge planning: Plan to go to rehab.  Will transfer to the orthopedic floor as they search for a rehab bed.  DVT Prophylaxis - Lovenox  and TED hose Weight-Bearing as tolerated to left leg  Krystal Doyne, PA-C Orthopaedic Surgery 04/25/2024, 7:15 AM  "

## 2024-04-25 NOTE — Plan of Care (Signed)
" °  Problem: Education: Goal: Knowledge of the prescribed therapeutic regimen will improve Outcome: Progressing   Problem: Bowel/Gastric: Goal: Gastrointestinal status for postoperative course will improve Outcome: Progressing   Problem: Cardiac: Goal: Ability to maintain an adequate cardiac output Outcome: Progressing   Problem: Neurological: Goal: Will regain or maintain usual level of consciousness Outcome: Progressing   Problem: Clinical Measurements: Goal: Ability to maintain clinical measurements within normal limits Outcome: Progressing   Problem: Respiratory: Goal: Will regain and/or maintain adequate ventilation Outcome: Progressing   "

## 2024-04-25 NOTE — Evaluation (Signed)
 Occupational Therapy Evaluation Patient Details Name: Alison Warren MRN: [id number] DOB: 11-03-1945 Today's Date: 04/25/2024   History of Present Illness   Pt is an 79 y/o F admitted on 04/24/24 for scheduled L TKA. PMH: aortic atherosclerosis, cardiomyopathy, chronic LBP with B sciatica, liver cirrhosis without ascites, cervical DDD, congenital deformity of R foot, diverticulosis, DM, GERD, hearing loss, HTN, HLD, hypomagnesemia     Clinical Impressions Pt was seen for OT evaluation this date. PTA, pt resides alone and is indep with ADLs and ambulatory with SPC. Son provides intermittent assist for medication management and provides transportation and heat up meals. Pt presents with deficits in strength, balance, activity tolerance and pain limiting their ability to perform ADL management at baseline level. Pt currently requires MOD I for bed mobility. Max A for polar care management. Pt is very HOH and has to have all instructions written down in large print to follow d/t hearing aid no longer working. She required CGA + RW for STS and ADL transfer to comfort height toilet for cont void and peri-hygiene. Functional mobility performed ~90 ft using RW + CGA. Left seated in recliner with all needs in place. Pt c/o mild pain to knee and notable fatigue with activity.  Pt would benefit from skilled OT services to address noted impairments and functional limitations to maximize safety and independence while minimizing future risk of falls, injury, and readmission. Do anticipate the need for follow up OT services upon acute hospital DC.      If plan is discharge home, recommend the following:   A little help with walking and/or transfers;A lot of help with bathing/dressing/bathroom;A little help with bathing/dressing/bathroom;Assistance with cooking/housework;Help with stairs or ramp for entrance     Functional Status Assessment   Patient has had a recent decline in their functional status and  demonstrates the ability to make significant improvements in function in a reasonable and predictable amount of time.     Equipment Recommendations   Other (comment) (defer)     Recommendations for Other Services         Precautions/Restrictions   Precautions Precautions: Fall Restrictions Weight Bearing Restrictions Per Provider Order: Yes LLE Weight Bearing Per Provider Order: Weight bearing as tolerated     Mobility Bed Mobility Overal bed mobility: Modified Independent                  Transfers Overall transfer level: Needs assistance Equipment used: Rolling walker (2 wheels) Transfers: Sit to/from Stand Sit to Stand: Contact guard assist           General transfer comment: from EOB and toilet during session, functional mobility with RW +CGA ~90 ft      Balance Overall balance assessment: Needs assistance Sitting-balance support: Feet supported Sitting balance-Leahy Scale: Good Sitting balance - Comments: performed peri hygiene on toilet with no LOB   Standing balance support: Bilateral upper extremity supported, During functional activity, Reliant on assistive device for balance Standing balance-Leahy Scale: Fair                             ADL either performed or assessed with clinical judgement   ADL Overall ADL's : Needs assistance/impaired     Grooming: Wash/dry hands;Standing;Supervision/safety;Contact guard assist                   Toilet Transfer: Contact guard assist;Comfort height toilet;Rolling walker (2 wheels)   Toileting- Clothing Manipulation and Hygiene:  Supervision/safety;Sitting/lateral lean       Functional mobility during ADLs: Contact guard assist;Rolling walker (2 wheels)       Vision Baseline Vision/History: 6 Macular Degeneration Ability to See in Adequate Light: 2 Moderately impaired Patient Visual Report: No change from baseline       Perception         Praxis          Pertinent Vitals/Pain Pain Assessment Pain Assessment: Faces Faces Pain Scale: Hurts little more Pain Location: anterior L knee Pain Descriptors / Indicators: Discomfort Pain Intervention(s): Monitored during session, Repositioned, Ice applied, Limited activity within patient's tolerance     Extremity/Trunk Assessment Upper Extremity Assessment Upper Extremity Assessment: Overall WFL for tasks assessed   Lower Extremity Assessment Lower Extremity Assessment: LLE deficits/detail LLE Deficits / Details: 2/2 L TKA       Communication Communication Communication: Impaired Factors Affecting Communication: Hearing impaired   Cognition Arousal: Alert Behavior During Therapy: WFL for tasks assessed/performed                                 Following commands: Impaired Following commands impaired: Follows one step commands with increased time     Cueing  General Comments   Cueing Techniques: Verbal cues;Gestural cues;Tactile cues;Visual cues  cont void on toilet, fatigue with ambulation   Exercises     Shoulder Instructions      Home Living Family/patient expects to be discharged to:: Private residence Living Arrangements: Alone Available Help at Discharge: Family;Available PRN/intermittently (no family can provide 24 hr assistance at d/c) Type of Home: House Home Access: Stairs to enter [employer] of Steps: 5-6 Entrance Stairs-Rails: Right;Left;Can reach both Home Layout: One level     Bathroom Shower/Tub: Walk-in shower         Home Equipment: [place] - single point          Prior Functioning/Environment Prior Level of Function : Needs assist             Mobility Comments: Ambulatory with SPC, denies falls. ADLs Comments: Son provides transportation to the grocery store. Bathes & dresses without assistance. Son provides supervision for med management & provides meals that pt only has to heat in microwave.    OT Problem  List: Decreased strength;Decreased activity tolerance;Impaired balance (sitting and/or standing)   OT Treatment/Interventions: Self-care/ADL training;Therapeutic exercise;Patient/family education;Balance training;Therapeutic activities;DME and/or AE instruction;Energy conservation      OT Goals(Current goals can be found in the care plan section)   Acute Rehab OT Goals Patient Stated Goal: get better OT Goal Formulation: With patient Time For Goal Achievement: 05/09/24 Potential to Achieve Goals: Good ADL Goals Pt Will Perform Lower Body Dressing: with modified independence;sit to/from stand;sitting/lateral leans Pt Will Transfer to Toilet: with modified independence;ambulating Pt/caregiver will Perform Home Exercise Program: Increased strength;Both right and left upper extremity;With Supervision;With theraband   OT Frequency:  Min 2X/week    Co-evaluation              AM-PAC OT 6 Clicks Daily Activity     Outcome Measure Help from another person eating meals?: A Little Help from another person taking care of personal grooming?: A Little Help from another person toileting, which includes using toliet, bedpan, or urinal?: A Little Help from another person bathing (including washing, rinsing, drying)?: A Lot Help from another person to put on and taking off regular upper body clothing?: A Little Help from another  person to put on and taking off regular lower body clothing?: A Lot 6 Click Score: 16   End of Session Equipment Utilized During Treatment: Rolling walker (2 wheels) Nurse Communication: Mobility status  Activity Tolerance: Patient tolerated treatment well Patient left: in chair;with call bell/phone within reach  OT Visit Diagnosis: Other abnormalities of gait and mobility (R26.89);Muscle weakness (generalized) (M62.81);Pain Pain - Right/Left: Left                Time: 8648-8587 OT Time Calculation (min): 21 min Charges:  OT General Charges $OT Visit: 1  Visit OT Evaluation $OT Eval Moderate Complexity: 1 Mod Alison Warren, Alison Warren 04/25/24, 3:19 PM  Neda Willenbring E Warren 04/25/2024, 3:16 PM

## 2024-04-26 DIAGNOSIS — M1712 Unilateral primary osteoarthritis, left knee: Secondary | ICD-10-CM | POA: Diagnosis not present

## 2024-04-26 LAB — CBC
HCT: 33.7 % — ABNORMAL LOW (ref 36.0–46.0)
Hemoglobin: 10.3 g/dL — ABNORMAL LOW (ref 12.0–15.0)
MCH: 25.3 pg — ABNORMAL LOW (ref 26.0–34.0)
MCHC: 30.6 g/dL (ref 30.0–36.0)
MCV: 82.8 fL (ref 80.0–100.0)
Platelets: 215 K/uL (ref 150–400)
RBC: 4.07 MIL/uL (ref 3.87–5.11)
RDW: 15.9 % — ABNORMAL HIGH (ref 11.5–15.5)
WBC: 9 K/uL (ref 4.0–10.5)
nRBC: 0 % (ref 0.0–0.2)

## 2024-04-26 LAB — GLUCOSE, CAPILLARY
Glucose-Capillary: 131 mg/dL — ABNORMAL HIGH (ref 70–99)
Glucose-Capillary: 167 mg/dL — ABNORMAL HIGH (ref 70–99)
Glucose-Capillary: 168 mg/dL — ABNORMAL HIGH (ref 70–99)
Glucose-Capillary: 150 mg/dL — ABNORMAL HIGH (ref 70–99)

## 2024-04-26 MED ORDER — POLYETHYLENE GLYCOL 3350 17 G PO PACK
17.0000 g | PACK | Freq: Every day | ORAL | Status: DC | PRN
  Administered 2024-04-26 – 2024-04-27 (×2): 17 g via ORAL
  Filled 2024-04-26 (×2): qty 1

## 2024-04-26 MED ORDER — HYDROCODONE-ACETAMINOPHEN 5-325 MG PO TABS: 1.0000 | ORAL_TABLET | ORAL | 0 refills | Status: DC | PRN

## 2024-04-26 MED ORDER — ENOXAPARIN SODIUM 40 MG/0.4ML IJ SOSY: 40.0000 mg | PREFILLED_SYRINGE | INTRAMUSCULAR | 0 refills | Status: AC

## 2024-04-26 MED ORDER — POLYETHYLENE GLYCOL 3350 17 G PO PACK: 17.0000 g | PACK | Freq: Every day | ORAL | 0 refills | Status: DC | PRN

## 2024-04-26 MED ORDER — POLYETHYLENE GLYCOL 3350 17 G PO PACK: 17.0000 g | PACK | Freq: Every day | ORAL | 0 refills | Status: AC | PRN

## 2024-04-26 MED ORDER — HYDROCODONE-ACETAMINOPHEN 5-325 MG PO TABS: 1.0000 | ORAL_TABLET | ORAL | 0 refills | Status: AC | PRN

## 2024-04-26 MED ORDER — CHLORHEXIDINE GLUCONATE 4 % EX SOLN: 1.0000 | CUTANEOUS | 1 refills | Status: AC

## 2024-04-26 NOTE — Progress Notes (Signed)
 Physical Therapy Treatment Patient Details Name: Alison Warren MRN: [id number] DOB: 1945-10-30 Today's Date: 04/26/2024   History of Present Illness Pt is an 79 y/o F admitted on 04/24/24 for scheduled L TKA. PMH: aortic atherosclerosis, cardiomyopathy, chronic LBP with B sciatica, liver cirrhosis without ascites, cervical DDD, congenital deformity of R foot, diverticulosis, DM, GERD, hearing loss, HTN, HLD, hypomagnesemia    PT Comments  Patient found supine in bed with initial encounter unable to perform therapy due to pain in L knee resulting in nurse to provided medication prior to second attempt with nursing acknowledging to do. Upon second attempt patient reporting L knee hurting but agreeable to therapy services. Patient completed bed mobility requiring min a for donning/doffing of LLE coupled with usage of bed rail to sit to EOB. Patient complete sts from EOB at min a with gestural and visual cuing for hand placement with good carryover. Patient completed 8 feet, and 3 feet with antalgic gait coupled with festinating pattern at CGA. Attempted to visual cue for increasing stride length with no carryover due to patients visual impairments. Patient completed SPT to bedside commode at [facility] requiring cga/min a to ascend from commode with nursing present in room. Patient left supine in bed with polar ice donned, call bell/alarm in reach and bed alarm set with patient displaying no signs of distress.   If plan is discharge home, recommend the following: A little help with walking and/or transfers;A little help with bathing/dressing/bathroom;Assistance with cooking/housework;Assist for transportation;Help with stairs or ramp for entrance   Can travel by private vehicle        Equipment Recommendations  Rolling walker (2 wheels);BSC/3in1    Recommendations for Other Services       Precautions / Restrictions Precautions Precautions: Fall Restrictions Weight Bearing Restrictions Per Provider  Order: Yes LLE Weight Bearing Per Provider Order: Weight bearing as tolerated     Mobility  Bed Mobility Overal bed mobility: Needs Assistance (Donning and doffing of LLE) Bed Mobility: Supine to Sit     Supine to sit: Min assist     General bed mobility comments: Requiring assistance for donning/doffing of LLE in and out of bed    Transfers Overall transfer level: Needs assistance Equipment used: Rolling walker (2 wheels) Transfers: Sit to/from Stand Sit to Stand: Min assist           General transfer comment: Sts from EOB    Ambulation/Gait                   Stairs             Wheelchair Mobility     Tilt Bed    Modified Rankin (Stroke Patients Only)       Balance Overall balance assessment: Needs assistance Sitting-balance support: Feet supported Sitting balance-Leahy Scale: Good     Standing balance support: During functional activity, Bilateral upper extremity supported, Reliant on assistive device for balance                                Communication Communication Communication: Impaired Factors Affecting Communication: Hearing impaired  Cognition Arousal: Alert Behavior During Therapy: WFL for tasks assessed/performed   PT - Cognitive impairments: Difficult to assess Difficult to assess due to: Hard of hearing/deaf                     PT - Cognition Comments: Pt pleasant, follows simple commands  throughout session with commands written in large print Following commands: Impaired Following commands impaired: Follows one step commands with increased time    Cueing Cueing Techniques: Verbal cues, Gestural cues, Tactile cues, Visual cues  Exercises      General Comments        Pertinent Vitals/Pain Pain Assessment Pain Assessment:  (difficulty to assessing pain due to [facility] and visually impaired) Pain Descriptors / Indicators: Discomfort Pain Intervention(s): Monitored during session, Premedicated  before session    Home Living                          Prior Function            PT Goals (current goals can now be found in the care plan section) Acute Rehab PT Goals Patient Stated Goal: go to rehab Potential to Achieve Goals: Fair Progress towards PT goals: Progressing toward goals    Frequency    BID      PT Plan      Co-evaluation              AM-PAC PT 6 Clicks Mobility   Outcome Measure  Help needed turning from your back to your side while in a flat bed without using bedrails?: None Help needed moving from lying on your back to sitting on the side of a flat bed without using bedrails?: A Little Help needed moving to and from a bed to a chair (including a wheelchair)?: A Little Help needed standing up from a chair using your arms (e.g., wheelchair or bedside chair)?: A Little Help needed to walk in hospital room?: A Little   6 Click Score: 16    End of Session         PT Visit Diagnosis: Other abnormalities of gait and mobility (R26.89);Difficulty in walking, not elsewhere classified (R26.2);Muscle weakness (generalized) (M62.81)     Time: 8641-8575 PT Time Calculation (min) (ACUTE ONLY): 26 min  Charges:    $Gait Training: 8-22 mins $Therapeutic Activity: 8-22 mins PT General Charges $$ ACUTE PT VISIT: 1 Visit                     Waddell Lesches, PTA

## 2024-04-26 NOTE — Plan of Care (Signed)
" °  Problem: Nutritional: Goal: Will attain and maintain optimal nutritional status Outcome: Progressing   Problem: Skin Integrity: Goal: Demonstrates signs of wound healing without infection Outcome: Progressing   Problem: Urinary Elimination: Goal: Will remain free from infection Outcome: Progressing   Problem: Activity: Goal: Ability to avoid complications of mobility impairment will improve Outcome: Progressing   "

## 2024-04-26 NOTE — Discharge Summary (Signed)
 " Physician Discharge Summary  Patient ID: Alison Warren MRN: [id number] DOB/AGE: Apr 02, 1946 79 y.o.  Admit date: 04/24/2024 Discharge date: 04/26/2024  Admission Diagnoses:  Primary osteoarthritis of left knee [M17.12] S/P TKR (total knee replacement), left [Z96.652]   Discharge Diagnoses: Patient Active Problem List   Diagnosis Date Noted   S/P TKR (total knee replacement), left 04/24/2024   Sebaceous cyst 09/27/2023   Chest pain 09/19/2023   Other specified injury of long flexor muscle, fascia and tendon of right thumb at wrist and hand level, subsequent encounter 06/25/2023   Pruritic disorder 07/26/2022   Cirrhosis of liver without ascites, unspecified hepatic cirrhosis type (HCC) 07/05/2021   Benign essential hypertension 12/20/2020   Degenerative disc disease, cervical 03/23/2020   Chronic bilateral low back pain with bilateral sciatica 02/12/2020   Lumbar spondylosis with myelopathy 02/09/2020   Aortic atherosclerosis 01/19/2020   GERD (gastroesophageal reflux disease) 11/21/2018   Hyperlipidemia associated with type 2 diabetes mellitus (HCC) 11/21/2018   Primary osteoarthritis of left knee 05/09/2018   B12 deficiency 04/30/2018   Obesity (BMI 30.0-34.9) 09/14/2017   Hepatic steatosis 02/26/2017   Diverticulitis of colon 09/22/2016   Vitamin D  deficiency 09/01/2016   Type II diabetes mellitus with complication (HCC) 08/24/2016   Hx of adenomatous colonic polyps 02/24/2016   NAFLD (nonalcoholic fatty liver disease) 90/92/7982   Osteopenia of multiple sites 09/09/2015   Seasonal allergic rhinitis due to pollen 08/24/2015   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/2015   Abnormal mammogram with microcalcification 07/02/2014   Muscle cramp, nocturnal 08/20/2013   Hearing loss of both ears 08/19/2013   Blindness of left eye with normal vision in contralateral eye 05/14/2006    Past Medical History:  Diagnosis Date   Arthritis    Bronchitis    none currently   Chronic  cough    Costochondritis    Diabetes mellitus without complication (HCC)    8-9 years   Diverticulitis    Diverticulitis 08/2016   GERD (gastroesophageal reflux disease)    Hypercholesteremia    Hypertension    Hypomagnesemia 08/19/2014   Lactic acidosis 08/24/2016   Wears hearing aid    right ear      Consultants (if any):   Discharged Condition: Improved  Hospital Course: KANDEE ESCALANTE is an 79 y.o. female who was admitted 04/24/2024 with a diagnosis of S/P TKR (total knee replacement), left and went to the operating room on 04/24/2024 and underwent the below named procedures.    Surgeries: Procedures: ARTHROPLASTY, KNEE, TOTAL on 04/24/2024 Patient tolerated the surgery well. Taken to PACU where she was stabilized and then transferred to the orthopedic floor.  Started on Lovenox  40mg . No evidence of DVT. Negative Homan. Physical therapy started on day #1 for gait training and transfer. OT started day #1 for ADL and assisted devices.  Implants:  Femur: Persona Size 9 CR Narrow   Tibia: Persona Size D Keel Cemented  Poly: 10mm MC  Patella: 32x8.85mm symmetric   She was given perioperative antibiotics:  Anti-infectives (From admission, onward)    Start     Dose/Rate Route Frequency Ordered Stop   04/24/24 1700  ceFAZolin  (ANCEF ) IVPB 2g/100 mL premix        2 g 200 mL/hr over 30 Minutes Intravenous Every 6 hours 04/24/24 1634 04/25/24 0019   04/24/24 0900  ceFAZolin  (ANCEF ) IVPB 2g/100 mL premix        2 g 200 mL/hr over 30 Minutes Intravenous On call to O.R. 04/24/24 9152 04/24/24 1123     .  She was given sequential compression devices, early ambulation for DVT prophylaxis.  She benefited maximally from the hospital stay and there were no complications.    Recent vital signs:  Vitals:   04/26/24 0343 04/26/24 0725  BP: 138/72 121/69  Pulse: 64 60  Resp: 16 19  Temp: 98.3 F (36.8 C) 98.5 F (36.9 C)  SpO2: 92% 95%    Recent laboratory studies:  Lab  Results  Component Value Date   HGB 10.3 (L) 04/26/2024   HGB 12.1 04/25/2024   HGB 12.4 04/17/2024   Lab Results  Component Value Date   WBC 9.0 04/26/2024   PLT 215 04/26/2024   Lab Results  Component Value Date   INR 1.05 02/08/2016   Lab Results  Component Value Date   NA 139 04/25/2024   K 4.2 04/25/2024   CL 104 04/25/2024   CO2 24 04/25/2024   BUN 12 04/25/2024   CREATININE 0.79 04/25/2024   GLUCOSE 161 (H) 04/25/2024    Discharge Medications:   Allergies as of 04/26/2024       Reactions   Tramadol  Nausea And Vomiting   Motrin [ibuprofen] Rash        Medication List     TAKE these medications    amLODipine  5 MG tablet Commonly known as: NORVASC  Take 1 tablet (5 mg total) by mouth daily.   atorvastatin  10 MG tablet Commonly known as: LIPITOR TAKE 1 TABLET BY MOUTH EVERY DAY   carvedilol  3.125 MG tablet Commonly known as: COREG  Take 1 tablet (3.125 mg total) by mouth 2 (two) times daily with a meal.   chlorhexidine  4 % external liquid Commonly known as: HIBICLENS  Apply 15 mLs (1 Application total) topically as directed for 30 doses. Use as directed daily for 5 days every other week for 6 weeks.   clotrimazole -betamethasone  cream Commonly known as: LOTRISONE  Apply 1 Application topically daily.   DULoxetine  20 MG capsule Commonly known as: CYMBALTA  TAKE 2 CAPSULES BY MOUTH EVERY DAY   enoxaparin  40 MG/0.4ML injection Commonly known as: LOVENOX  Inject 0.4 mLs (40 mg total) into the skin daily.   ezetimibe  10 MG tablet Commonly known as: ZETIA  TAKE 1 TABLET BY MOUTH EVERY DAY   HYDROcodone -acetaminophen  5-325 MG tablet Commonly known as: NORCO/VICODIN Take 1 tablet by mouth every 4 (four) hours as needed for moderate pain (pain score 4-6).   metFORMIN  500 MG 24 hr tablet Commonly known as: GLUCOPHAGE -XR Take 2 tablets (1,000 mg total) by mouth in the morning and at bedtime.   mupirocin  ointment 2 % Commonly known as: BACTROBAN  Place  1 Application into the nose 2 (two) times daily for 60 doses. Use as directed 2 times daily for 5 days every other week for 6 weeks.   omeprazole  40 MG capsule Commonly known as: PRILOSEC TAKE 1 CAPSULE (40 MG TOTAL) BY MOUTH DAILY.   OneTouch Delica Plus Lancet33G Misc 1 EACH BY DOES NOT APPLY ROUTE SEE ADMIN INSTRUCTIONS.   OneTouch Ultra test strip Generic drug: glucose blood Use once daily   pantoprazole  40 MG tablet Commonly known as: PROTONIX  TAKE 1 TABLET (40 MG TOTAL) BY MOUTH TWICE A DAY BEFORE MEALS   polyethylene glycol 17 g packet Commonly known as: MIRALAX  / GLYCOLAX  Take 17 g by mouth daily as needed for moderate constipation.        Diagnostic Studies: DG Knee 1-2 Views Left Result Date: 04/24/2024 EXAM: 1 OR 2 VIEW(S) XRAY OF THE LEFT KNEE 04/24/2024 01:10:00 PM COMPARISON: 11/20/2023 CLINICAL  HISTORY: Surgery, elective [phone] FINDINGS: BONES AND JOINTS: Left total knee arthroplasty in place. Expected postsurgical joint fluid and gas. No acute fracture. SOFT TISSUES: Expected soft tissue swelling and gas. IMPRESSION: 1. Left total knee arthroplasty with expected immediate postsurgical changes. Electronically signed by: Waddell Calk MD 04/24/2024 03:49 PM EST RP Workstation: [id number]    Disposition: Discharge disposition: 03-Skilled Nursing Facility          Contact information for follow-up providers     Charlene Debby BROCKS, PA-C Follow up in 2 week(s).   Specialties: Orthopedic Surgery, Emergency Medicine Why: For wound care and x-rays Contact information: [address] [place] [address] [phone]              Contact information for after-discharge care     Destination     [facility]   Service: Skilled Nursing Contact information: [address] [place]  [address] [phone]                     Cleared for discharge pending SNF placement  Signed: Vick Dunk, PA-C 04/26/2024, 10:29 AM  "

## 2024-04-26 NOTE — Progress Notes (Addendum)
 Subjective: 2 Days Post-Op Procedures (LRB): ARTHROPLASTY, KNEE, TOTAL (Left) Patient reports pain as moderate.   Denies any CP, SOB, N/V, fevers or chills We will start therapy today.  Plan is to go Skilled nursing facility after hospital stay.  Objective: Vital signs in last 24 hours: Temp:  [97.7 F (36.5 C)-98.5 F (36.9 C)] 98.5 F (36.9 C) (01/17 0725) Pulse Rate:  [58-72] 60 (01/17 0725) Resp:  [16-19] 19 (01/17 0725) BP: (112-138)/(63-72) 121/69 (01/17 0725) SpO2:  [92 %-96 %] 95 % (01/17 0725)  Intake/Output from previous day: No intake or output data in the 24 hours ending 04/26/24 1018  Intake/Output this shift: No intake/output data recorded.  Labs: Recent Labs    04/25/24 0935 04/26/24 0400  HGB 12.1 10.3*   Recent Labs    04/25/24 0935 04/26/24 0400  WBC 13.1* 9.0  RBC 4.82 4.07  HCT 39.0 33.7*  PLT 261 215   Recent Labs    04/25/24 0935  NA 139  K 4.2  CL 104  CO2 24  BUN 12  CREATININE 0.79  GLUCOSE 161*  CALCIUM  9.9   No results for input(s): LABPT, INR in the last 72 hours.  EXAM General - Patient is alert, appropriate, and oriented. Extremity - ABD soft Sensation intact distally Intact pulses distally Dorsiflexion/Plantar flexion intact Dressing -  dressing C/D/I. Polar care unit in place. Motor Function - intact, moving foot and toes well on exam. Difficulty with straight leg raise independently.   Past Medical History:  Diagnosis Date   Arthritis    Bronchitis    none currently   Chronic cough    Costochondritis    Diabetes mellitus without complication (HCC)    8-9 years   Diverticulitis    Diverticulitis 08/2016   GERD (gastroesophageal reflux disease)    Hypercholesteremia    Hypertension    Hypomagnesemia 08/19/2014   Lactic acidosis 08/24/2016   Wears hearing aid    right ear    Assessment/Plan: 2 Days Post-Op Procedures (LRB): ARTHROPLASTY, KNEE, TOTAL (Left) Principal Problem:   S/P TKR (total knee  replacement), left  Estimated body mass index is 30.08 kg/m as calculated from the following:   Height as of 04/17/24: 5' (1.524 m).   Weight as of 04/17/24: 69.9 kg. Advance diet Up with therapy D/C IV fluids Discharge to SNF  Awaiting confirmation of SNF placement.   Patient will continue to work with physical therapy to pass postoperative PT protocols, ROM and strengthening  Discussed with the patient continuing to utilize Polar Care  Patient will use bone foam in 20-30 minute intervals  Patient will wear TED hose bilaterally to help prevent DVT and clot formation  Discussed sending the patient to SNF with Norco for as needed pain management.   Patient will take Lovenox  40mg  injection daily for 2 weeks for DVT prophylaxis  Weight-Bearing as tolerated to left leg  Cleared for discharge to SNF. Patient will follow-up with [facility] clinic orthopedics in 2 weeks for staple removal and reevaluation  Vick Dunk, PA-C [facility] 04/26/2024, 10:18 AM

## 2024-04-26 NOTE — TOC Progression Note (Signed)
 Transition of Care [facility]) - Progression Note    Patient Details  Name: Alison Warren MRN: [id number] Date of Birth: [birth year]  Transition of Care [facility] CM/SW Contact  Cady Hafen L [place], [place] Phone Number: 04/26/2024, 11:54 AM  Clinical Narrative:      Call received from R. Arvell Lowing. CSW advised that there are no beds this weekend. There will be bed availability until Monday morning.   Patient cannot discharge to SNF this weekend.                    Expected Discharge Plan and Services         Expected Discharge Date: 04/26/24                                     Social Drivers of Health (SDOH) Interventions SDOH Screenings   Food Insecurity: No Food Insecurity (04/24/2024)  Housing: Low Risk (04/24/2024)  Transportation Needs: No Transportation Needs (04/24/2024)  Utilities: Not At Risk (04/24/2024)  Alcohol Screen: Low Risk (05/09/2023)  Depression (PHQ2-9): Low Risk (04/08/2024)  Financial Resource Strain: Low Risk  (04/14/2024)   Received from [facility]  Physical Activity: Insufficiently Active (05/09/2023)  Social Connections: Socially Isolated (04/24/2024)  Stress: No Stress Concern Present (02/22/2022)  Tobacco Use: Low Risk (04/24/2024)  Health Literacy: Adequate Health Literacy (05/09/2023)    Readmission Risk Interventions     No data to display

## 2024-04-26 NOTE — Progress Notes (Signed)
 Physical Therapy Treatment Patient Details Name: Alison Warren MRN: [id number] DOB: 16-Jul-1945 Today's Date: 04/26/2024   History of Present Illness Pt is an 79 y/o F admitted on 04/24/24 for scheduled L TKA. PMH: aortic atherosclerosis, cardiomyopathy, chronic LBP with B sciatica, liver cirrhosis without ascites, cervical DDD, congenital deformity of R foot, diverticulosis, DM, GERD, hearing loss, HTN, HLD, hypomagnesemia    PT Comments  Patient found supine in bed requiring physical touch for alertness. Patient agreeable to perform bed exercises. Patient completed supine exercises requiring AAROM on heel slides, hip abduction, and SAQ. Patient required PROM on LLE for SLR. Patient presenting with minimal knee flexion secondary to pain limiting ROM. Patient provided knee protocol handout. Patient left supine in bed with call bell/phone in reach with bed alarm set and polar ice donned.    If plan is discharge home, recommend the following: A little help with walking and/or transfers;A little help with bathing/dressing/bathroom;Assistance with cooking/housework;Assist for transportation;Help with stairs or ramp for entrance   Can travel by private vehicle        Equipment Recommendations  Rolling walker (2 wheels);BSC/3in1    Recommendations for Other Services       Precautions / Restrictions Precautions Precautions: Fall Restrictions Weight Bearing Restrictions Per Provider Order: Yes LLE Weight Bearing Per Provider Order: Weight bearing as tolerated     Mobility  Bed Mobility Overal bed mobility: Needs Assistance Bed Mobility: Supine to Sit     Supine to sit: Min assist     General bed mobility comments: Requiring assistance for donning/doffing of LLE in and out of bed    Transfers Overall transfer level: Needs assistance Equipment used: Rolling walker (2 wheels) Transfers: Sit to/from Stand Sit to Stand: Min assist           General transfer comment: Sts from  EOB    Ambulation/Gait                   Stairs             Wheelchair Mobility     Tilt Bed    Modified Rankin (Stroke Patients Only)       Balance Overall balance assessment: Needs assistance Sitting-balance support: Feet supported Sitting balance-Leahy Scale: Good     Standing balance support: During functional activity, Bilateral upper extremity supported, Reliant on assistive device for balance                                Communication Communication Communication: Impaired Factors Affecting Communication: Hearing impaired  Cognition Arousal: Alert Behavior During Therapy: WFL for tasks assessed/performed   PT - Cognitive impairments: Difficult to assess Difficult to assess due to: Hard of hearing/deaf                     PT - Cognition Comments: Pt pleasant, follows simple commands throughout session with commands written in large print Following commands: Impaired Following commands impaired: Follows one step commands with increased time    Cueing Cueing Techniques: Verbal cues, Gestural cues, Tactile cues, Visual cues  Exercises General Exercises - Lower Extremity Ankle Circles/Pumps: AROM, 10 reps, Both Short Arc Quad: AAROM, Left, 10 reps Heel Slides: AAROM, Left, 10 reps Hip ABduction/ADduction: AAROM, Left, 10 reps Straight Leg Raises: AAROM, Left, 10 reps    General Comments        Pertinent Vitals/Pain Pain Assessment Pain Assessment:  (difficulty to assessing  pain due to [facility] and visually impaired) Pain Descriptors / Indicators: Discomfort Pain Intervention(s): Monitored during session    Home Living                          Prior Function            PT Goals (current goals can now be found in the care plan section) Acute Rehab PT Goals Patient Stated Goal: go to rehab Potential to Achieve Goals: Fair Progress towards PT goals: Progressing toward goals    Frequency    BID       PT Plan      Co-evaluation              AM-PAC PT 6 Clicks Mobility   Outcome Measure  Help needed turning from your back to your side while in a flat bed without using bedrails?: None Help needed moving from lying on your back to sitting on the side of a flat bed without using bedrails?: A Little Help needed moving to and from a bed to a chair (including a wheelchair)?: A Little Help needed standing up from a chair using your arms (e.g., wheelchair or bedside chair)?: A Little Help needed to walk in hospital room?: A Little   6 Click Score: 16    End of Session         PT Visit Diagnosis: Other abnormalities of gait and mobility (R26.89);Difficulty in walking, not elsewhere classified (R26.2);Muscle weakness (generalized) (M62.81)     Time: 8384-8368 PT Time Calculation (min) (ACUTE ONLY): 16 min  Charges:    $Gait Training: 8-22 mins $Therapeutic Exercise: 8-22 mins $Therapeutic Activity: 8-22 mins PT General Charges $$ ACUTE PT VISIT: 1 Visit                     Waddell Lesches, PTA

## 2024-04-26 NOTE — Care Management Obs Status (Signed)
 MEDICARE OBSERVATION STATUS NOTIFICATION   Patient Details  Name: Alison Warren MRN: [id number] Date of Birth: 06-04-1945   Medicare Observation Status Notification Given:  Yes    Delphine KANDICE Bring, RN 04/26/2024, 12:41 PM

## 2024-04-26 NOTE — Plan of Care (Signed)
" °  Problem: Education: Goal: Knowledge of the prescribed therapeutic regimen will improve Outcome: Progressing   Problem: Bowel/Gastric: Goal: Gastrointestinal status for postoperative course will improve Outcome: Progressing   Problem: Cardiac: Goal: Ability to maintain an adequate cardiac output Outcome: Progressing Goal: Will show no evidence of cardiac arrhythmias Outcome: Progressing   "

## 2024-04-27 DIAGNOSIS — M1712 Unilateral primary osteoarthritis, left knee: Secondary | ICD-10-CM | POA: Diagnosis not present

## 2024-04-27 LAB — GLUCOSE, CAPILLARY
Glucose-Capillary: 151 mg/dL — ABNORMAL HIGH (ref 70–99)
Glucose-Capillary: 150 mg/dL — ABNORMAL HIGH (ref 70–99)
Glucose-Capillary: 132 mg/dL — ABNORMAL HIGH (ref 70–99)
Glucose-Capillary: 162 mg/dL — ABNORMAL HIGH (ref 70–99)

## 2024-04-27 NOTE — Anesthesia Postprocedure Evaluation (Signed)
"   Anesthesia Post Note  Patient: YARENIS CERINO  Procedure(s) Performed: ARTHROPLASTY, KNEE, TOTAL (Left: Knee)  Patient location during evaluation: Nursing Unit Anesthesia Type: Spinal Level of consciousness: oriented and awake and alert Pain management: pain level controlled Vital Signs Assessment: post-procedure vital signs reviewed and stable Respiratory status: spontaneous breathing and respiratory function stable Cardiovascular status: blood pressure returned to baseline and stable Postop Assessment: no headache, no backache, no apparent nausea or vomiting and patient able to bend at knees Anesthetic complications: no   No notable events documented.   Last Vitals:  Vitals:   04/27/24 0447 04/27/24 0729  BP: 138/83 (!) 159/83  Pulse: 77 85  Resp: 18 19  Temp: 36.9 C 37.1 C  SpO2: 92% 92%    Last Pain:  Vitals:   04/27/24 0805  TempSrc:   PainSc: 5                  Lendia LITTIE Mae      "

## 2024-04-27 NOTE — Progress Notes (Signed)
 Physical Therapy Treatment Patient Details Name: Alison Warren MRN: [id number] DOB: 09-04-1945 Today's Date: 04/27/2024   History of Present Illness Pt is an 79 y/o F admitted on 04/24/24 for scheduled L TKA. PMH: aortic atherosclerosis, cardiomyopathy, chronic LBP with B sciatica, liver cirrhosis without ascites, cervical DDD, congenital deformity of R foot, diverticulosis, DM, GERD, hearing loss, HTN, HLD, hypomagnesemia    PT Comments  Patient found asleep in room upon arrival. Patient reporting hurting in L knee and stating  honey, I dont believe ill be able to do anything today; however, patient agreeable to therapy services after stating ambulation needed to reduce risk of restricted ROM. Patient completed supine to EOB with usage of bed rail and HHA when sitting to EOB. Patient still requiring min a for donning/doffing of LLE in bed. Patient completed sts from EOB to rolling walker requiring visual and verbal cuing for hand placement with partial understanding upon ascending with patient completing at min A with patient presenting with facial grimace during transitional movement from sitting to standing. Patient complete ambulation for 28 feet at [facility] for safety with seated rest break between bouts. Patient presenting with short stride length in BLE with step to pattern on LLE. Patient unable to follow cuing for technique due to visual and hearing impairments even with Right hearing aide donned, but no overt LOB or knee buckling when ambulating. Patient completed SPT from rolling walker to recliner at [facility] with lateral stepping during pivoting sequence. Patient guided through gentle passive seated knee flexion and extension for 5 reps on LLE with patient reporting feels a little better, honey. Patient presenting with approx 75 degree knee flexion and partial knee flexion during ambulation during session. Patient left supine in bed with call bell/phone in reach, bed alarm set,and zero degree knee foam  donned for extension. Returned later with patient tolerating foam for approx 10 min with no signs of distress.   If plan is discharge home, recommend the following: A little help with walking and/or transfers;A little help with bathing/dressing/bathroom;Assistance with cooking/housework;Assist for transportation;Help with stairs or ramp for entrance   Can travel by private vehicle        Equipment Recommendations  Rolling walker (2 wheels);BSC/3in1    Recommendations for Other Services       Precautions / Restrictions Precautions Precautions: Fall Restrictions Weight Bearing Restrictions Per Provider Order: Yes LLE Weight Bearing Per Provider Order: Weight bearing as tolerated     Mobility  Bed Mobility Overal bed mobility: Needs Assistance Bed Mobility: Supine to Sit     Supine to sit: Min assist     General bed mobility comments: Requiring assistance for donning/doffing of LLE in and out of bed. Patient did require HHA for lifting torso to EOB a this with increased time and effort secondary to L knee discomfort impacting mobility.    Transfers Overall transfer level: Needs assistance Equipment used: Rolling walker (2 wheels) Transfers: Sit to/from Stand Sit to Stand: Min assist           General transfer comment: STS from EOB and recliner with rolling walker at min A with increased time to ascend    Ambulation/Gait Ambulation/Gait assistance: Contact guard assist Gait Distance (Feet): 28 Feet (28 feet x 2) Assistive device: Rolling walker (2 wheels) Gait Pattern/deviations: Step-to pattern, Decreased stride length, Antalgic Gait velocity: decreased     General Gait Details:  (Step to pattern with LLE with heay usage of BUE support upon advancement coupled with short stride length.  Hearing and visual impairements impacting follow through with cuing for technique)   Stairs             Wheelchair Mobility     Tilt Bed    Modified Rankin (Stroke  Patients Only)       Balance Overall balance assessment: Needs assistance Sitting-balance support: Feet supported Sitting balance-Leahy Scale: Good     Standing balance support: During functional activity, Bilateral upper extremity supported, Reliant on assistive device for balance                                Communication Communication Communication: Impaired Factors Affecting Communication: Hearing impaired  Cognition Arousal: Alert Behavior During Therapy: WFL for tasks assessed/performed   PT - Cognitive impairments: Difficult to assess Difficult to assess due to: Hard of hearing/deaf                     PT - Cognition Comments: Pt pleasant, follows simple commands throughout session with commands written in large print Following commands: Impaired Following commands impaired: Follows one step commands with increased time    Cueing Cueing Techniques: Verbal cues, Gestural cues, Tactile cues, Visual cues  Exercises Total Joint Exercises Long Arc Quad: AAROM, Seated, Left, 5 reps    General Comments        Pertinent Vitals/Pain Pain Assessment Pain Assessment: Faces Faces Pain Scale: Hurts a little bit Pain Location: anterior L knee after gait and activity Pain Descriptors / Indicators: Discomfort Pain Intervention(s): Monitored during session    Home Living                          Prior Function            PT Goals (current goals can now be found in the care plan section) Acute Rehab PT Goals Patient Stated Goal: go to rehab Potential to Achieve Goals: Fair Progress towards PT goals: Progressing toward goals    Frequency    BID      PT Plan      Co-evaluation              AM-PAC PT 6 Clicks Mobility   Outcome Measure  Help needed turning from your back to your side while in a flat bed without using bedrails?: None Help needed moving from lying on your back to sitting on the side of a flat bed  without using bedrails?: A Little Help needed moving to and from a bed to a chair (including a wheelchair)?: A Little Help needed standing up from a chair using your arms (e.g., wheelchair or bedside chair)?: A Little Help needed to walk in hospital room?: A Little Help needed climbing 3-5 steps with a railing? : A Lot 6 Click Score: 18    End of Session Equipment Utilized During Treatment: Gait belt Activity Tolerance: Patient tolerated treatment well   Nurse Communication: Mobility status PT Visit Diagnosis: Other abnormalities of gait and mobility (R26.89);Difficulty in walking, not elsewhere classified (R26.2);Muscle weakness (generalized) (M62.81)     Time: 9053-8980 PT Time Calculation (min) (ACUTE ONLY): 33 min  Charges:    $Gait Training: 8-22 mins $Therapeutic Exercise: 8-22 mins $Therapeutic Activity: 8-22 mins PT General Charges $$ ACUTE PT VISIT: 1 Visit                     Waddell Lesches, PTA

## 2024-04-27 NOTE — Progress Notes (Signed)
 ORTHOPAEDICS PROGRESS NOTE  PATIENT NAME: Alison Warren DOB: 12/14/1945  MRN: [id number]  POD # 3: Left total knee arthroplasty   Subjective: Moderate pain. Visual and auditory acuity hampering rehab.  Objective: Vital signs in last 24 hours: Temp:  [98 F (36.7 C)-98.7 F (37.1 C)] 98.4 F (36.9 C) (01/18 1939) Pulse Rate:  [66-85] 74 (01/18 1939) Resp:  [17-20] 17 (01/18 1939) BP: (106-159)/(54-83) 121/64 (01/18 1939) SpO2:  [91 %-95 %] 93 % (01/18 1939)  Intake/Output from previous day: 01/17 0701 - 01/18 0700 In: 240 [P.O.:240] Out: -   Recent Labs    04/25/24 0935 04/26/24 0400  WBC 13.1* 9.0  HGB 12.1 10.3*  HCT 39.0 33.7*  PLT 261 215  K 4.2  --   CL 104  --   CO2 24  --   BUN 12  --   CREATININE 0.79  --   GLUCOSE 161*  --   CALCIUM  9.9  --    Assessment: Left total knee arthroplasty   Plan: PT notes reviewed. Slow progress but patient participates with encouragement. Plan is to go Skilled nursing facility after hospital stay. Awaiting bed availability.  Jameis Newsham P. Andria Head, Jr. M.D.

## 2024-04-27 NOTE — Plan of Care (Signed)
" °  Problem: Education: Goal: Knowledge of the prescribed therapeutic regimen will improve Outcome: Progressing   Problem: Bowel/Gastric: Goal: Gastrointestinal status for postoperative course will improve Outcome: Progressing   Problem: Cardiac: Goal: Ability to maintain an adequate cardiac output Outcome: Progressing Goal: Will show no evidence of cardiac arrhythmias Outcome: Progressing   Problem: Nutritional: Goal: Will attain and maintain optimal nutritional status Outcome: Progressing   "

## 2024-04-28 DIAGNOSIS — M1712 Unilateral primary osteoarthritis, left knee: Secondary | ICD-10-CM | POA: Diagnosis not present

## 2024-04-28 LAB — GLUCOSE, CAPILLARY: Glucose-Capillary: 119 mg/dL — ABNORMAL HIGH (ref 70–99)

## 2024-04-28 NOTE — TOC Transition Note (Signed)
 Transition of Care [facility]) - Discharge Note   Patient Details  Name: Alison Warren MRN: [id number] Date of Birth: 01/29/1946  [facility]) CM/SW Contact:  Alvaro Louder, LCSW Phone Number: 04/28/2024, 9:11 AM   Clinical Narrative:   LCSWA received insurance approval for patient to admit to SNF. LCSWA confirmed with MD that patient is stable for discharge. LCSWA notified the patient and Son and they are in agreement with discharge . LCSWA confirmed bed is available at [facility] Transport arranged with [facility] for next available.    [phone] Ext [phone]   TOC signing off  Final next level of care: Skilled Nursing Facility Barriers to Discharge: No Barriers Identified   Patient Goals and CMS Choice            Discharge Placement              Patient [facility] bed at: [facility] Patient to be transferred to facility by: [facility] Name of family member notified: Christopher Patient and family notified of of transfer: 04/28/24  Discharge Plan and Services Additional resources added to the After Visit Summary for                                       Social Drivers of Health (SDOH) Interventions SDOH Screenings   Food Insecurity: No Food Insecurity (04/24/2024)  Housing: Low Risk (04/24/2024)  Transportation Needs: No Transportation Needs (04/24/2024)  Utilities: Not At Risk (04/24/2024)  Alcohol Screen: Low Risk (05/09/2023)  Depression (PHQ2-9): Low Risk (04/08/2024)  Financial Resource Strain: Low Risk  (04/14/2024)   Received from [facility]  Physical Activity: Insufficiently Active (05/09/2023)  Social Connections: Socially Isolated (04/24/2024)  Stress: No Stress Concern Present (02/22/2022)  Tobacco Use: Low Risk (04/24/2024)  Health Literacy: Adequate Health Literacy (05/09/2023)     Readmission Risk Interventions     No data to display

## 2024-04-28 NOTE — Discharge Summary (Signed)
 " Physician Discharge Summary  Patient ID: Alison Warren MRN: [id number] DOB/AGE: 1946/02/21 79 y.o.  Admit date: 04/24/2024 Discharge date: 04/28/2024  Admission Diagnoses:  Primary osteoarthritis of left knee [M17.12] S/P TKR (total knee replacement), left [Z96.652]   Discharge Diagnoses: Patient Active Problem List   Diagnosis Date Noted   S/P TKR (total knee replacement), left 04/24/2024   Sebaceous cyst 09/27/2023   Chest pain 09/19/2023   Other specified injury of long flexor muscle, fascia and tendon of right thumb at wrist and hand level, subsequent encounter 06/25/2023   Pruritic disorder 07/26/2022   Cirrhosis of liver without ascites, unspecified hepatic cirrhosis type (HCC) 07/05/2021   Benign essential hypertension 12/20/2020   Degenerative disc disease, cervical 03/23/2020   Chronic bilateral low back pain with bilateral sciatica 02/12/2020   Lumbar spondylosis with myelopathy 02/09/2020   Aortic atherosclerosis 01/19/2020   GERD (gastroesophageal reflux disease) 11/21/2018   Hyperlipidemia associated with type 2 diabetes mellitus (HCC) 11/21/2018   Primary osteoarthritis of left knee 05/09/2018   B12 deficiency 04/30/2018   Obesity (BMI 30.0-34.9) 09/14/2017   Hepatic steatosis 02/26/2017   Diverticulitis of colon 09/22/2016   Vitamin D  deficiency 09/01/2016   Type II diabetes mellitus with complication (HCC) 08/24/2016   Hx of adenomatous colonic polyps 02/24/2016   NAFLD (nonalcoholic fatty liver disease) 90/92/7982   Osteopenia of multiple sites 09/09/2015   Seasonal allergic rhinitis due to pollen 08/24/2015   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/2015   Abnormal mammogram with microcalcification 07/02/2014   Muscle cramp, nocturnal 08/20/2013   Hearing loss of both ears 08/19/2013   Blindness of left eye with normal vision in contralateral eye 05/14/2006    Past Medical History:  Diagnosis Date   Arthritis    Bronchitis    none currently   Chronic  cough    Costochondritis    Diabetes mellitus without complication (HCC)    8-9 years   Diverticulitis    Diverticulitis 08/2016   GERD (gastroesophageal reflux disease)    Hypercholesteremia    Hypertension    Hypomagnesemia 08/19/2014   Lactic acidosis 08/24/2016   Wears hearing aid    right ear      Consultants (if any):   Discharged Condition: Improved  Hospital Course: Alison Warren is an 79 y.o. female who was admitted 04/24/2024 with a diagnosis of S/P TKR (total knee replacement), left and went to the operating room on 04/24/2024 and underwent the below named procedures.    Surgeries: Procedures: ARTHROPLASTY, KNEE, TOTAL on 04/24/2024 Patient tolerated the surgery well. Taken to PACU where she was stabilized and then transferred to the orthopedic floor.  No evidence of DVT. Negative Homan. Physical therapy started on day #1 for gait training and transfer. OT started day #1 for ADL and assisted devices.  Patient's IV ,and hemovac was d/c on day #1. Foley was removed shortly after surgery.   Implants: Femur: Persona Size 9 CR Narrow   Tibia: Persona Size D Keel Cemented  Poly: 10mm MC  Patella: 32x8.29mm symmetric   She was given perioperative antibiotics:  Anti-infectives (From admission, onward)    Start     Dose/Rate Route Frequency Ordered Stop   04/24/24 1700  ceFAZolin  (ANCEF ) IVPB 2g/100 mL premix        2 g 200 mL/hr over 30 Minutes Intravenous Every 6 hours 04/24/24 1634 04/25/24 0019   04/24/24 0900  ceFAZolin  (ANCEF ) IVPB 2g/100 mL premix        2 g 200 mL/hr  over 30 Minutes Intravenous On call to O.R. 04/24/24 9152 04/24/24 1123     .  She was given sequential compression devices, early ambulation DVT prophylaxis.  She benefited maximally from the hospital stay and there were no complications.    Recent vital signs:  Vitals:   04/27/24 1939 04/28/24 0320  BP: 121/64 135/72  Pulse: 74 76  Resp: 17 16  Temp: 98.4 F (36.9 C) 98.6 F (37 C)   SpO2: 93% 93%    Recent laboratory studies:  Lab Results  Component Value Date   HGB 10.3 (L) 04/26/2024   HGB 12.1 04/25/2024   HGB 12.4 04/17/2024   Lab Results  Component Value Date   WBC 9.0 04/26/2024   PLT 215 04/26/2024   Lab Results  Component Value Date   INR 1.05 02/08/2016   Lab Results  Component Value Date   NA 139 04/25/2024   K 4.2 04/25/2024   CL 104 04/25/2024   CO2 24 04/25/2024   BUN 12 04/25/2024   CREATININE 0.79 04/25/2024   GLUCOSE 161 (H) 04/25/2024    Discharge Medications:   Allergies as of 04/28/2024       Reactions   Tramadol  Nausea And Vomiting   Motrin [ibuprofen] Rash        Medication List     TAKE these medications    amLODipine  5 MG tablet Commonly known as: NORVASC  Take 1 tablet (5 mg total) by mouth daily.   atorvastatin  10 MG tablet Commonly known as: LIPITOR TAKE 1 TABLET BY MOUTH EVERY DAY   carvedilol  3.125 MG tablet Commonly known as: COREG  Take 1 tablet (3.125 mg total) by mouth 2 (two) times daily with a meal.   chlorhexidine  4 % external liquid Commonly known as: HIBICLENS  Apply 15 mLs (1 Application total) topically as directed for 30 doses. Use as directed daily for 5 days every other week for 6 weeks.   clotrimazole -betamethasone  cream Commonly known as: LOTRISONE  Apply 1 Application topically daily.   DULoxetine  20 MG capsule Commonly known as: CYMBALTA  TAKE 2 CAPSULES BY MOUTH EVERY DAY   enoxaparin  40 MG/0.4ML injection Commonly known as: LOVENOX  Inject 0.4 mLs (40 mg total) into the skin daily.   ezetimibe  10 MG tablet Commonly known as: ZETIA  TAKE 1 TABLET BY MOUTH EVERY DAY   HYDROcodone -acetaminophen  5-325 MG tablet Commonly known as: NORCO/VICODIN Take 1 tablet by mouth every 4 (four) hours as needed for moderate pain (pain score 4-6).   metFORMIN  500 MG 24 hr tablet Commonly known as: GLUCOPHAGE -XR Take 2 tablets (1,000 mg total) by mouth in the morning and at bedtime.    mupirocin  ointment 2 % Commonly known as: BACTROBAN  Place 1 Application into the nose 2 (two) times daily for 60 doses. Use as directed 2 times daily for 5 days every other week for 6 weeks.   omeprazole  40 MG capsule Commonly known as: PRILOSEC TAKE 1 CAPSULE (40 MG TOTAL) BY MOUTH DAILY.   OneTouch Delica Plus Lancet33G Misc 1 EACH BY DOES NOT APPLY ROUTE SEE ADMIN INSTRUCTIONS.   OneTouch Ultra test strip Generic drug: glucose blood Use once daily   pantoprazole  40 MG tablet Commonly known as: PROTONIX  TAKE 1 TABLET (40 MG TOTAL) BY MOUTH TWICE A DAY BEFORE MEALS   polyethylene glycol 17 g packet Commonly known as: MIRALAX  / GLYCOLAX  Take 17 g by mouth daily as needed for moderate constipation.        Diagnostic Studies: DG Knee 1-2 Views Left Result Date:  04/24/2024 EXAM: 1 OR 2 VIEW(S) XRAY OF THE LEFT KNEE 04/24/2024 01:10:00 PM COMPARISON: 11/20/2023 CLINICAL HISTORY: Surgery, elective [id number] FINDINGS: BONES AND JOINTS: Left total knee arthroplasty in place. Expected postsurgical joint fluid and gas. No acute fracture. SOFT TISSUES: Expected soft tissue swelling and gas. IMPRESSION: 1. Left total knee arthroplasty with expected immediate postsurgical changes. Electronically signed by: Waddell Calk MD 04/24/2024 03:49 PM EST RP Workstation: [id number]    Disposition: Discharge disposition: 03-Skilled Nursing Facility          Contact information for follow-up providers     Charlene Debby BROCKS, PA-C Follow up in 2 week(s).   Specialties: Orthopedic Surgery, Emergency Medicine Why: For wound care and x-rays Contact information: [address] [place] [address] [phone]              Contact information for after-discharge care     Destination     [facility]   Service: Skilled Nursing Contact information: [address] [place]  [address] [phone]                      Cleared for discharge pending SNF placement today  Signed: Vick Dunk, PA-C 04/28/2024, 7:12 AM  "

## 2024-04-28 NOTE — Plan of Care (Signed)
" °  Problem: Bowel/Gastric: Goal: Gastrointestinal status for postoperative course will improve Outcome: Not Progressing   Problem: Education: Goal: Knowledge of the prescribed therapeutic regimen will improve Outcome: Not Progressing   Problem: Tissue Perfusion: Goal: Adequacy of tissue perfusion will improve Outcome: Not Progressing   Problem: Pain Management: Goal: Pain level will decrease with appropriate interventions Outcome: Not Progressing   Problem: Activity: Goal: Risk for activity intolerance will decrease Outcome: Not Progressing   "

## 2024-04-28 NOTE — Progress Notes (Signed)
 [facility] was called and report was given to nurse Tiexera.Waiting on transport to facility.

## 2024-04-28 NOTE — Plan of Care (Signed)
" °  Problem: Pain Managment: Goal: General experience of comfort will improve and/or be controlled Outcome: Progressing   Problem: Coping: Goal: Level of anxiety will decrease Outcome: Progressing   Problem: Nutrition: Goal: Adequate nutrition will be maintained Outcome: Progressing   Problem: Activity: Goal: Risk for activity intolerance will decrease Outcome: Progressing   "

## 2024-08-07 ENCOUNTER — Ambulatory Visit: Admitting: Student
